# Patient Record
Sex: Female | Born: 1945 | Race: Black or African American | Hispanic: No | Marital: Married | Smoking: Former smoker
Health system: Southern US, Community
[De-identification: ages and names within clinical notes are randomized; demographics above are authoritative.]

## PROBLEM LIST (undated history)

## (undated) DIAGNOSIS — M899 Disorder of bone, unspecified: Secondary | ICD-10-CM

## (undated) DIAGNOSIS — N924 Excessive bleeding in the premenopausal period: Secondary | ICD-10-CM

## (undated) DIAGNOSIS — L723 Sebaceous cyst: Secondary | ICD-10-CM

## (undated) DIAGNOSIS — R7989 Other specified abnormal findings of blood chemistry: Secondary | ICD-10-CM

## (undated) DIAGNOSIS — K5732 Diverticulitis of large intestine without perforation or abscess without bleeding: Secondary | ICD-10-CM

## (undated) DIAGNOSIS — M67919 Unspecified disorder of synovium and tendon, unspecified shoulder: Secondary | ICD-10-CM

## (undated) DIAGNOSIS — M545 Low back pain, unspecified: Secondary | ICD-10-CM

## (undated) DIAGNOSIS — M6740 Ganglion, unspecified site: Secondary | ICD-10-CM

## (undated) DIAGNOSIS — E785 Hyperlipidemia, unspecified: Secondary | ICD-10-CM

## (undated) DIAGNOSIS — I10 Essential (primary) hypertension: Secondary | ICD-10-CM

## (undated) DIAGNOSIS — R0602 Shortness of breath: Secondary | ICD-10-CM

## (undated) DIAGNOSIS — M719 Bursopathy, unspecified: Secondary | ICD-10-CM

## (undated) DIAGNOSIS — M949 Disorder of cartilage, unspecified: Secondary | ICD-10-CM

## (undated) HISTORY — DX: Unspecified disorder of synovium and tendon, unspecified shoulder: M67.919

## (undated) HISTORY — DX: Excessive bleeding in the premenopausal period: N92.4

## (undated) HISTORY — DX: Other specified abnormal findings of blood chemistry: R79.89

## (undated) HISTORY — DX: Bursopathy, unspecified: M71.9

## (undated) HISTORY — DX: Diverticulitis of large intestine without perforation or abscess without bleeding: K57.32

## (undated) HISTORY — DX: Essential (primary) hypertension: I10

## (undated) HISTORY — DX: Low back pain, unspecified: M54.50

## (undated) HISTORY — DX: Hyperlipidemia, unspecified: E78.5

## (undated) HISTORY — DX: Disorder of cartilage, unspecified: M94.9

## (undated) HISTORY — DX: Low back pain: M54.5

## (undated) HISTORY — DX: Ganglion, unspecified site: M67.40

## (undated) HISTORY — DX: Sebaceous cyst: L72.3

## (undated) HISTORY — DX: Shortness of breath: R06.02

## (undated) HISTORY — DX: Disorder of bone, unspecified: M89.9

---

## 2006-02-24 LAB — HM DEXA SCAN: HM Dexa Scan: NORMAL

## 2006-03-13 ENCOUNTER — Encounter: Payer: Self-pay | Admitting: Physician Assistant

## 2009-05-28 ENCOUNTER — Encounter: Admission: RE | Admit: 2009-05-28 | Discharge: 2009-05-28 | Payer: Self-pay | Admitting: Internal Medicine

## 2009-06-01 ENCOUNTER — Telehealth: Payer: Self-pay | Admitting: Physician Assistant

## 2009-06-02 ENCOUNTER — Telehealth: Payer: Self-pay | Admitting: Physician Assistant

## 2009-06-02 ENCOUNTER — Ambulatory Visit: Payer: Self-pay | Admitting: Gastroenterology

## 2009-06-02 DIAGNOSIS — B370 Candidal stomatitis: Secondary | ICD-10-CM | POA: Insufficient documentation

## 2009-06-02 DIAGNOSIS — R1032 Left lower quadrant pain: Secondary | ICD-10-CM | POA: Insufficient documentation

## 2009-06-02 DIAGNOSIS — R933 Abnormal findings on diagnostic imaging of other parts of digestive tract: Secondary | ICD-10-CM

## 2009-06-02 DIAGNOSIS — K5732 Diverticulitis of large intestine without perforation or abscess without bleeding: Secondary | ICD-10-CM | POA: Insufficient documentation

## 2009-06-02 DIAGNOSIS — R935 Abnormal findings on diagnostic imaging of other abdominal regions, including retroperitoneum: Secondary | ICD-10-CM | POA: Insufficient documentation

## 2009-06-23 ENCOUNTER — Ambulatory Visit: Payer: Self-pay | Admitting: Gastroenterology

## 2009-06-23 LAB — HM COLONOSCOPY

## 2012-04-16 ENCOUNTER — Other Ambulatory Visit: Payer: Self-pay | Admitting: Internal Medicine

## 2012-04-16 NOTE — Telephone Encounter (Signed)
Pharmacy aware of reason for denial.

## 2013-03-02 ENCOUNTER — Other Ambulatory Visit: Payer: Self-pay | Admitting: Internal Medicine

## 2013-03-06 ENCOUNTER — Other Ambulatory Visit: Payer: Self-pay | Admitting: *Deleted

## 2013-03-06 DIAGNOSIS — I10 Essential (primary) hypertension: Secondary | ICD-10-CM

## 2013-03-06 DIAGNOSIS — E785 Hyperlipidemia, unspecified: Secondary | ICD-10-CM

## 2013-07-02 ENCOUNTER — Other Ambulatory Visit: Payer: Medicare Other

## 2013-07-02 DIAGNOSIS — I10 Essential (primary) hypertension: Secondary | ICD-10-CM

## 2013-07-02 DIAGNOSIS — E785 Hyperlipidemia, unspecified: Secondary | ICD-10-CM

## 2013-07-03 ENCOUNTER — Encounter: Payer: Self-pay | Admitting: *Deleted

## 2013-07-03 LAB — LIPID PANEL
Chol/HDL Ratio: 2.4 ratio units (ref 0.0–4.4)
Cholesterol, Total: 165 mg/dL (ref 100–199)
HDL: 69 mg/dL (ref >39)
LDL Calculated: 81 mg/dL (ref 0–99)
Triglycerides: 73 mg/dL (ref 0–149)
VLDL Cholesterol Cal: 15 mg/dL (ref 5–40)

## 2013-07-03 LAB — BASIC METABOLIC PANEL
BUN/Creatinine Ratio: 21 (ref 11–26)
BUN: 19 mg/dL (ref 8–27)
CO2: 27 mmol/L (ref 18–29)
Calcium: 10 mg/dL (ref 8.6–10.2)
Chloride: 102 mmol/L (ref 97–108)
Creatinine, Ser: 0.89 mg/dL (ref 0.57–1.00)
GFR calc Af Amer: 78 mL/min/{1.73_m2} (ref >59)
GFR calc non Af Amer: 67 mL/min/{1.73_m2} (ref >59)
Glucose: 97 mg/dL (ref 65–99)
Potassium: 4.7 mmol/L (ref 3.5–5.2)
Sodium: 142 mmol/L (ref 134–144)

## 2013-07-04 ENCOUNTER — Encounter: Payer: Self-pay | Admitting: Internal Medicine

## 2013-07-04 ENCOUNTER — Ambulatory Visit (INDEPENDENT_AMBULATORY_CARE_PROVIDER_SITE_OTHER): Payer: Medicare Other | Admitting: Internal Medicine

## 2013-07-04 VITALS — BP 126/80 | HR 80 | Temp 98.8°F | Ht 61.0 in | Wt 118.0 lb

## 2013-07-04 DIAGNOSIS — M545 Low back pain, unspecified: Secondary | ICD-10-CM

## 2013-07-04 DIAGNOSIS — S4980XA Other specified injuries of shoulder and upper arm, unspecified arm, initial encounter: Secondary | ICD-10-CM

## 2013-07-04 DIAGNOSIS — E785 Hyperlipidemia, unspecified: Secondary | ICD-10-CM

## 2013-07-04 DIAGNOSIS — G5682 Other specified mononeuropathies of left upper limb: Secondary | ICD-10-CM

## 2013-07-04 DIAGNOSIS — G5680 Other specified mononeuropathies of unspecified upper limb: Secondary | ICD-10-CM

## 2013-07-04 DIAGNOSIS — S46002A Unspecified injury of muscle(s) and tendon(s) of the rotator cuff of left shoulder, initial encounter: Secondary | ICD-10-CM | POA: Insufficient documentation

## 2013-07-04 DIAGNOSIS — S46909A Unspecified injury of unspecified muscle, fascia and tendon at shoulder and upper arm level, unspecified arm, initial encounter: Secondary | ICD-10-CM

## 2013-07-04 DIAGNOSIS — I10 Essential (primary) hypertension: Secondary | ICD-10-CM

## 2013-07-04 NOTE — Progress Notes (Signed)
Patient ID: Tamara Davidson, female   DOB: 08-25-1946, 67 y.o.   MRN: [id number] Location:  [facility]  Code Status: DNR   Allergies  Allergen Reactions  . Iodine   . Penicillins   . Shellfish Allergy   . Statins Other (See Comments)    High LFT's    Chief Complaint  Patient presents with  . Follow-up    6 month follow-up, discuss labs (copy given)     HPI: Patient is a 67 y.o. black female seen in the office today for med mgt chronic diseases.  Left shoulder and lower back bothering her.  Was diagnosed with nerve impingement in left shoulder 10--11 years ago.  Had a shot 4th quarter 2013--lasted only 2 wks.  Weather may make worse late at night.  Has had PT before with [facility].  Had low back injury 1976--bothers more frequently now.  Dr. Venita Lick saw her husband and she wonders if shots will help her back.    Labs were fabulous--bmp and lipids.    Review of Systems:  Review of Systems  Constitutional: Negative for fever, chills, weight loss and malaise/fatigue.  Eyes: Negative for blurred vision.  Cardiovascular: Negative for chest pain, palpitations and leg swelling.  Gastrointestinal: Negative for constipation.  Genitourinary: Negative for dysuria, urgency and frequency.  Musculoskeletal: Positive for back pain, joint pain and myalgias. Negative for falls.  Skin: Negative for rash.  Neurological: Negative for dizziness, loss of consciousness, weakness and headaches.  Endo/Heme/Allergies: Does not bruise/bleed easily.  Psychiatric/Behavioral: Negative for depression and memory loss.     Past Medical History  Diagnosis Date  . Sebaceous cyst   . 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 Essential hypertension, benign   . Premenopausal menorrhagia   . Disorder of bone and cartilage, unspecified   . Ganglion of tendon sheath   . Shortness of breath   . Lumbago   . Other abnormal blood  chemistry   . Diverticulitis of colon (without mention of hemorrhage)   . Other and unspecified hyperlipidemia     No past surgical history on file.  Social History:   reports that she has quit smoking. She started smoking about 43 years ago. She does not have any smokeless tobacco history on file. She reports that she does not drink alcohol or use illicit drugs.  No family history on file.  Medications: Patient's Medications  New Prescriptions   No medications on file  Previous Medications   ASPIRIN 81 MG TABLET    Take 81 mg by mouth daily. Take 1 tablet daily.   BLACK COHOSH 540 MG CAPS    Take 540 mg by mouth daily. Take 1 capsule once daily.   CALCIUM CARBONATE (OS-CAL) 600 MG TABS TABLET    Take 600 mg by mouth 2 (two) times daily with a meal. Take 1 tablet twice daily.   CETIRIZINE (ZYRTEC) 10 MG TABLET    Take 10 mg by mouth daily. Take 1 tablet once daily as needed for allergies.   CHOLECALCIFEROL (VITAMIN D) 400 UNITS TABS TABLET    Take 400 Units by mouth daily. Take 2 tablets once daily for bones.   GLUCOSAMINE-CHONDROIT-VIT C-MN (GLUCOSAMINE-CHONDROITIN) TABS    Take by mouth.   LISINOPRIL-HYDROCHLOROTHIAZIDE (PRINZIDE,ZESTORETIC) 10-12.5 MG PER TABLET    TAKE 1 TABLET DAILY FOR BLOOD PRESSURE   MULTIPLE VITAMINS-MINERALS (MULTIVITAMIN WITH MINERALS) TABLET    Take 1 tablet by mouth daily.  PROBIOTIC PRODUCT (PROBIOTIC DAILY PO)    Take by mouth. Insync: 1 by mouth daily   ROSUVASTATIN (CRESTOR) 5 MG TABLET    Take 5 mg by mouth daily. Take 1/2 tablet by mouth at bedtime Mon, Wed, Fri. Avoid grapefruit/products.   VITAMIN B-12 (CYANOCOBALAMIN) 500 MCG TABLET    Take 500 mcg by mouth daily.   VITAMIN C (ASCORBIC ACID) 500 MG TABLET    Take 500 mg by mouth daily.  Modified Medications   No medications on file  Discontinued Medications   No medications on file     Physical Exam: Filed Vitals:   07/04/13 1136  BP: 126/80  Pulse: 80  Temp: 98.8 F (37.1 C)   TempSrc: Oral  Height: 5\' 1"  (1.549 m)  Weight: 118 lb (53.524 kg)  SpO2: 97%  Physical Exam  Constitutional: She is oriented to person, place, and time. She appears well-developed and well-nourished. No distress.  Neck: Normal range of motion.  Cardiovascular: Normal rate, regular rhythm, normal heart sounds and intact distal pulses.   Pulmonary/Chest: Effort normal and breath sounds normal. No respiratory distress.  Abdominal: Soft. Bowel sounds are normal. She exhibits no distension and no mass. There is no tenderness.  Musculoskeletal: She exhibits tenderness.  Left shoulder over rotator cuff insertion  Neurological: She is alert and oriented to person, place, and time.  Skin: Skin is warm and dry.  Psychiatric: She has a normal mood and affect.    Labs reviewed: Basic Metabolic Panel:  Recent Labs  16/10/96 0813  NA 142  K 4.7  CL 102  CO2 27  GLUCOSE 97  BUN 19  CREATININE 0.89  CALCIUM 10.0  Lipid Panel:  Recent Labs  07/02/13 0813  HDL 69  LDLCALC 81  TRIG 73  CHOLHDL 2.4  Assessment/Plan 1. Hyperlipidemia LDL goal < 100 -cont low dose crestor and diet and exercise as she can tolerate with current shoulder and back pain  2. Essential hypertension, benign -bp stable with lisinopril/hctz, asa 81mg   3. Pinched nerve in shoulder, left -referred to orthopedics for further evaluation and management  4. Low back pain -seeing ortho and may get back injections, cont tylenol use and rare nsaids if needed  8/25, saw gyn and had mammogram--last august had pinhead area on right breast--has to get one more 6 month f/u due to this.  If stable again, can go back to annual.  Labs/tests ordered:  Orders Placed This Encounter  Procedures  . DNR (Do Not Resuscitate)    Order Specific Question:  Maintain current active treatments    Answer:  Yes    Order Specific Question:  Do not initiate new interventions    Answer:  Yes    Next appt:  6 mos

## 2013-08-05 ENCOUNTER — Ambulatory Visit: Payer: Self-pay

## 2013-09-11 ENCOUNTER — Encounter: Payer: Self-pay | Admitting: Internal Medicine

## 2013-09-11 ENCOUNTER — Ambulatory Visit (INDEPENDENT_AMBULATORY_CARE_PROVIDER_SITE_OTHER): Payer: Medicare Other | Admitting: Internal Medicine

## 2013-09-11 VITALS — BP 102/60 | HR 83 | Temp 98.1°F | Wt 120.2 lb

## 2013-09-11 DIAGNOSIS — M67919 Unspecified disorder of synovium and tendon, unspecified shoulder: Secondary | ICD-10-CM

## 2013-09-11 DIAGNOSIS — M7592 Shoulder lesion, unspecified, left shoulder: Secondary | ICD-10-CM

## 2013-09-11 DIAGNOSIS — S46819A Strain of other muscles, fascia and tendons at shoulder and upper arm level, unspecified arm, initial encounter: Secondary | ICD-10-CM

## 2013-09-11 DIAGNOSIS — E785 Hyperlipidemia, unspecified: Secondary | ICD-10-CM

## 2013-09-11 DIAGNOSIS — Z01818 Encounter for other preprocedural examination: Secondary | ICD-10-CM

## 2013-09-11 DIAGNOSIS — I10 Essential (primary) hypertension: Secondary | ICD-10-CM

## 2013-09-11 DIAGNOSIS — S43499A Other sprain of unspecified shoulder joint, initial encounter: Secondary | ICD-10-CM

## 2013-09-11 DIAGNOSIS — S46112A Strain of muscle, fascia and tendon of long head of biceps, left arm, initial encounter: Secondary | ICD-10-CM

## 2013-09-11 NOTE — Progress Notes (Signed)
Patient ID: Korynn Kenedy, female   DOB: 12/14/1945, 67 y.o.   MRN: [id number] Location:  [facility]   Allergies  Allergen Reactions  . Iodine   . Penicillins   . Shellfish Allergy   . Statins Other (See Comments)    High LFT's    Chief Complaint  Patient presents with  . Shoulder Pain    shoulder pain x     HPI: Patient is a 67 y.o. black female seen in the office today for medical clearance prior to shoulder surgery.    Waking up with 12/10 pain at night sometimes in the left shoulder.   Has at least partially torn biceps tendon and moderate supraspinatus tendinosis on MRI at [facility] on 08/28/13.   10-15 years ago told she had impingement in her shoulder   She has not had any significant medical problems.  Is very active--works out regularly and eats a healthy diet.    Review of Systems:  Review of Systems  Constitutional: Negative for fever and chills.  Respiratory: Negative for shortness of breath.   Cardiovascular: Negative for chest pain.  Gastrointestinal: Negative for abdominal pain, constipation, blood in stool and melena.  Genitourinary: Negative for dysuria.  Musculoskeletal: Positive for joint pain and myalgias. Negative for falls.  Neurological: Positive for focal weakness.       Of left shoulder due to pain, torn tendon     Past Medical History  Diagnosis Date  . Sebaceous cyst   . 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 Essential hypertension, benign   . Premenopausal menorrhagia   . Disorder of bone and cartilage, unspecified   . Ganglion of tendon sheath   . Shortness of breath   . Lumbago   . Other abnormal blood chemistry   . Diverticulitis of colon (without mention of hemorrhage)   . Other and unspecified hyperlipidemia     History reviewed. No pertinent past surgical history.  Social History:   reports that she has quit smoking. She started smoking about 43  years ago. She does not have any smokeless tobacco history on file. She reports that she does not drink alcohol or use illicit drugs.  History reviewed. No pertinent family history.  Medications: Patient's Medications  New Prescriptions   No medications on file  Previous Medications   ASPIRIN 81 MG TABLET    Take 81 mg by mouth daily. Take 1 tablet daily.   BLACK COHOSH 540 MG CAPS    Take 540 mg by mouth daily. Take 1 capsule once daily.   CALCIUM CARBONATE (OS-CAL) 600 MG TABS TABLET    Take 600 mg by mouth 2 (two) times daily with a meal. Take 1 tablet twice daily.   CETIRIZINE (ZYRTEC) 10 MG TABLET    Take 10 mg by mouth daily. Take 1 tablet once daily as needed for allergies.   CHOLECALCIFEROL (VITAMIN D) 400 UNITS TABS TABLET    Take 400 Units by mouth daily. Take 2 tablets once daily for bones.   GLUCOSAMINE-CHONDROIT-VIT C-MN (GLUCOSAMINE-CHONDROITIN) TABS    Take by mouth.   LISINOPRIL-HYDROCHLOROTHIAZIDE (PRINZIDE,ZESTORETIC) 10-12.5 MG PER TABLET    TAKE 1 TABLET DAILY FOR BLOOD PRESSURE   MULTIPLE VITAMINS-MINERALS (MULTIVITAMIN WITH MINERALS) TABLET    Take 1 tablet by mouth daily.   PROBIOTIC PRODUCT (PROBIOTIC DAILY PO)    Take by mouth. Insync: 1 by mouth daily   ROSUVASTATIN (CRESTOR) 5 MG TABLET  Take 5 mg by mouth daily. Take 1/2 tablet by mouth at bedtime Mon, Wed, Fri. Avoid grapefruit/products.   VITAMIN B-12 (CYANOCOBALAMIN) 500 MCG TABLET    Take 500 mcg by mouth daily.   VITAMIN C (ASCORBIC ACID) 500 MG TABLET    Take 500 mg by mouth daily.  Modified Medications   No medications on file  Discontinued Medications   No medications on file     Physical Exam: Filed Vitals:   09/11/13 1502  BP: 102/60  Pulse: 83  Temp: 98.1 F (36.7 C)  TempSrc: Oral  Weight: 120 lb 3.2 oz (54.522 kg)  SpO2: 97%  Physical Exam  Constitutional: She is oriented to person, place, and time. She appears well-developed and well-nourished. No distress.  HENT:  Head:  Normocephalic and atraumatic.  Eyes: EOM are normal. Pupils are equal, round, and reactive to light.  Neck: Neck supple.  Cardiovascular: Normal rate, regular rhythm, normal heart sounds and intact distal pulses.  Exam reveals no gallop and no friction rub.   No murmur heard. Pulmonary/Chest: Effort normal and breath sounds normal. No respiratory distress.  Abdominal: Soft. Bowel sounds are normal. There is no tenderness.  Musculoskeletal: Normal range of motion. She exhibits no edema.  Left shoulder with normal ROM but pain--she actually would not move the shoulder for me b/c it hurts so much;  Some localized tenderness over biceps insertion site region  Neurological: She is alert and oriented to person, place, and time. She displays normal reflexes. No cranial nerve deficit. She exhibits normal muscle tone. Coordination normal.  Skin: Skin is warm and dry.  Psychiatric: She has a normal mood and affect. Her behavior is normal. Judgment and thought content normal.    Labs reviewed: Basic Metabolic Panel:  Recent Labs  09/81/19 0813  NA 142  K 4.7  CL 102  CO2 27  GLUCOSE 97  BUN 19  CREATININE 0.89  CALCIUM 10.0  Lipid Panel:  Recent Labs  07/02/13 0813  HDL 69  LDLCALC 81  TRIG 73  CHOLHDL 2.4   Past Procedures:  Assessment/Plan 1. Preoperative clearance -all September labs normal -check cbc with diff b/c this has not been done recently - EKG 12-Lead--was normal sinus today w/o abnormality -she has no complaints aside from her localized shoulder discomfort for which she is to have surgery -continue active lifestyle with healthy diet -is low risk patient at this point  2. Essential hypertension, benign -bp at goal  3. Hyperlipidemia LDL goal < 100 -lipids at goal, cont diet and exercise and very low dose crestor  4. Biceps tendon tear, left, initial encounter -for surgery by Dr. Tod Persia mgt through orthopedics  5. Supraspinatus tendonitis,  left --for surgery by Dr. Tod Persia mgt through orthopedics   Labs/tests ordered:  Cbc today Next appt:  Keep scheduled appt

## 2013-09-12 LAB — CBC WITH DIFFERENTIAL/PLATELET
Basophils Absolute: 0 10*3/uL (ref 0.0–0.2)
Basos: 0 %
Eos: 2 %
Eosinophils Absolute: 0.1 10*3/uL (ref 0.0–0.4)
HCT: 40.7 % (ref 34.0–46.6)
Hemoglobin: 13.6 g/dL (ref 11.1–15.9)
Immature Grans (Abs): 0 10*3/uL (ref 0.0–0.1)
Immature Granulocytes: 0 %
Lymphocytes Absolute: 2.5 10*3/uL (ref 0.7–3.1)
Lymphs: 35 %
MCH: 30.3 pg (ref 26.6–33.0)
MCHC: 33.4 g/dL (ref 31.5–35.7)
MCV: 91 fL (ref 79–97)
Monocytes Absolute: 0.4 10*3/uL (ref 0.1–0.9)
Monocytes: 6 %
Neutrophils Absolute: 4.1 10*3/uL (ref 1.4–7.0)
Neutrophils Relative %: 57 %
RBC: 4.49 x10E6/uL (ref 3.77–5.28)
RDW: 13.6 % (ref 12.3–15.4)
WBC: 7.2 10*3/uL (ref 3.4–10.8)

## 2013-09-30 ENCOUNTER — Encounter: Payer: Self-pay | Admitting: Internal Medicine

## 2013-10-06 HISTORY — PX: ROTATOR CUFF REPAIR: SHX139

## 2013-12-07 ENCOUNTER — Other Ambulatory Visit: Payer: Self-pay | Admitting: Internal Medicine

## 2013-12-16 DIAGNOSIS — M545 Low back pain, unspecified: Secondary | ICD-10-CM | POA: Insufficient documentation

## 2013-12-16 DIAGNOSIS — E785 Hyperlipidemia, unspecified: Secondary | ICD-10-CM | POA: Insufficient documentation

## 2013-12-16 DIAGNOSIS — I10 Essential (primary) hypertension: Secondary | ICD-10-CM | POA: Insufficient documentation

## 2014-01-05 ENCOUNTER — Other Ambulatory Visit: Payer: Self-pay | Admitting: Internal Medicine

## 2014-01-05 ENCOUNTER — Ambulatory Visit (INDEPENDENT_AMBULATORY_CARE_PROVIDER_SITE_OTHER): Payer: Medicare Other | Admitting: Internal Medicine

## 2014-01-05 ENCOUNTER — Encounter: Payer: Self-pay | Admitting: Internal Medicine

## 2014-01-05 VITALS — BP 108/70 | HR 84 | Temp 98.2°F | Resp 14 | Wt 116.4 lb

## 2014-01-05 DIAGNOSIS — S4980XA Other specified injuries of shoulder and upper arm, unspecified arm, initial encounter: Secondary | ICD-10-CM

## 2014-01-05 DIAGNOSIS — I10 Essential (primary) hypertension: Secondary | ICD-10-CM

## 2014-01-05 DIAGNOSIS — M545 Low back pain, unspecified: Secondary | ICD-10-CM

## 2014-01-05 DIAGNOSIS — M8580 Other specified disorders of bone density and structure, unspecified site: Secondary | ICD-10-CM

## 2014-01-05 DIAGNOSIS — M949 Disorder of cartilage, unspecified: Secondary | ICD-10-CM

## 2014-01-05 DIAGNOSIS — E785 Hyperlipidemia, unspecified: Secondary | ICD-10-CM

## 2014-01-05 DIAGNOSIS — S46002A Unspecified injury of muscle(s) and tendon(s) of the rotator cuff of left shoulder, initial encounter: Secondary | ICD-10-CM

## 2014-01-05 DIAGNOSIS — S46909A Unspecified injury of unspecified muscle, fascia and tendon at shoulder and upper arm level, unspecified arm, initial encounter: Secondary | ICD-10-CM

## 2014-01-05 DIAGNOSIS — M899 Disorder of bone, unspecified: Secondary | ICD-10-CM

## 2014-01-05 MED ORDER — LISINOPRIL-HYDROCHLOROTHIAZIDE 10-12.5 MG PO TABS: 1.0000 | ORAL_TABLET | Freq: Every day | ORAL | Status: DC

## 2014-01-05 NOTE — Progress Notes (Signed)
Patient ID: Tamara Davidson, female   DOB: 1946-03-15, 68 y.o.   MRN: [id number]   Location:  [facility]  Code Status: DNR  Allergies  Allergen Reactions  . Iodine   . Penicillins   . Shellfish Allergy   . Statins Other (See Comments)    High LFT's    Chief Complaint  Patient presents with  . Medical Managment of Chronic Issues    f/u     HPI: Patient is a 68 y.o.  seen in the office today for medical mgt of chronic diseases.    She underwent surgery by Dr. Malon Kindle for her torn biceps tendon and moderate supraspinatus tendinosis seen on MRI.  I have not received a copy of the op report.  Left shoulder rotator cuff, tendon and ligament repair.  Doing well.  Still getting physical therapy.  Pain is remarkably improved.  Gets positional numbness and tingling if lays on left side.  Sees Dr. Ranell Patrick on Wednesday.   Wants to know when next bone density is.  02/24/06 was last bone density.  Needs a 6 month f/u mammogram done.  Could not lift her arm after the shoulder surgery to get it on time.    Neck pain is now gone.  Back pain also better interestingly.  Review of Systems:  ROS   Past Medical History  Diagnosis Date  . Sebaceous cyst   . 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 Essential hypertension, benign   . Premenopausal menorrhagia   . Disorder of bone and cartilage, unspecified   . Ganglion of tendon sheath   . Shortness of breath   . Lumbago   . Other abnormal blood chemistry   . Diverticulitis of colon (without mention of hemorrhage)   . Other and unspecified hyperlipidemia     Past Surgical History  Procedure Laterality Date  . Rotator cuff repair Left 10/06/13    tendon and ligament repair    Social History:   reports that she has quit smoking. She started smoking about 44 years ago. She does not have any smokeless tobacco history on file. She reports that she does not drink alcohol or use  illicit drugs.  Family History  Problem Relation Age of Onset  . Lung cancer Mother   . Osteoporosis Mother   . Coronary artery disease Father     Medications: Patient's Medications  New Prescriptions   No medications on file  Previous Medications   ASPIRIN 81 MG TABLET    Take 81 mg by mouth daily. Take 1 tablet daily.   BLACK COHOSH 540 MG CAPS    Take 540 mg by mouth daily. Take 1 capsule once daily.   CALCIUM CARBONATE (OS-CAL) 600 MG TABS TABLET    Take 600 mg by mouth 2 (two) times daily with a meal. Take 1 tablet twice daily.   CETIRIZINE (ZYRTEC) 10 MG TABLET    Take 10 mg by mouth daily. Take 1 tablet once daily as needed for allergies.   CHOLECALCIFEROL (VITAMIN D) 400 UNITS TABS TABLET    Take 400 Units by mouth daily. Take 2 tablets once daily for bones.   GLUCOSAMINE-CHONDROIT-VIT C-MN (GLUCOSAMINE-CHONDROITIN) TABS    Take by mouth.   LISINOPRIL-HYDROCHLOROTHIAZIDE (PRINZIDE,ZESTORETIC) 10-12.5 MG PER TABLET    TAKE 1 TABLET DAILY FOR BLOOD PRESSURE   MULTIPLE VITAMINS-MINERALS (MULTIVITAMIN WITH MINERALS) TABLET    Take 1 tablet by mouth daily.   PROBIOTIC  PRODUCT (PROBIOTIC DAILY PO)    Take by mouth. Insync: 1 by mouth daily   ROSUVASTATIN (CRESTOR) 5 MG TABLET    Take 5 mg by mouth daily. Take 1/2 tablet by mouth at bedtime Mon, Wed, Fri. Avoid grapefruit/products.   VITAMIN B-12 (CYANOCOBALAMIN) 500 MCG TABLET    Take 500 mcg by mouth daily.   VITAMIN C (ASCORBIC ACID) 500 MG TABLET    Take 500 mg by mouth daily.  Modified Medications   No medications on file  Discontinued Medications   No medications on file     Physical Exam: Filed Vitals:   01/05/14 1112  BP: 108/70  Pulse: 84  Temp: 98.2 F (36.8 C)  TempSrc: Oral  Resp: 14  Weight: 116 lb 6.4 oz (52.799 kg)  SpO2: 98%  Physical Exam   Labs reviewed: Basic Metabolic Panel:  Recent Labs  40/98/1108/01/11 0813  NA 142  K 4.7  CL 102  CO2 27  GLUCOSE 97  BUN 19  CREATININE 0.89  CALCIUM 10.0    CBC:  Recent Labs  09/11/13 1618  WBC 7.2  NEUTROABS 4.1  HGB 13.6  HCT 40.7  MCV 91   Lipid Panel:  Recent Labs  07/02/13 0813  HDL 69  LDLCALC 81  TRIG 73  CHOLHDL 2.4   Assessment/Plan 1. Osteopenia -pt is concerned about her bone density due to family history of osteoporosis -fortunately she continues ca with d and weight bearing exercise regularly, eats a balanced, healthy diet - DG Bone Density; Future - Vitamin D, 25-hydroxy; Future  2. Injury of left rotator cuff -s/p repair in December -will request copy of op report from Dr. Malon KindleSteven Norris to make sure that we document the procedure properly -doing very well with therapy and follows up with him next week  3. Hyperlipidemia LDL goal < 100 - CBC with Differential; Future - Comprehensive metabolic panel; Future - Lipid panel; Future -cont crestor as currently taking, diet and exercise which have been effective, f/u labs at 6 mo physical  4. Essential hypertension, benign - CBC with Differential; Future - lisinopril-hydrochlorothiazide (PRINZIDE,ZESTORETIC) 10-12.5 MG per tablet; Take 1 tablet by mouth daily.  Dispense: 90 tablet; Refill: 3  5. Low back pain -has resolved since surgery for shoulder interestingly  Labs/tests ordered:   Orders Placed This Encounter  Procedures  . DG Bone Density    Standing Status: Future     Number of Occurrences:      Standing Expiration Date: 03/08/2015    Order Specific Question:  Reason for Exam (SYMPTOM  OR DIAGNOSIS REQUIRED)    Answer:  osteopenia, postmenopausal, fh/o osteoporosis     Comments:  is on oscal with D    Order Specific Question:  Preferred imaging location?    [facility]  . CBC with Differential    Standing Status: Future     Number of Occurrences:      Standing Expiration Date: 01/06/2015  . Comprehensive metabolic panel    Standing Status: Future     Number of Occurrences:      Standing Expiration Date: 01/06/2015  . Lipid panel     Standing Status: Future     Number of Occurrences:      Standing Expiration Date: 01/06/2015  . Vitamin D, 25-hydroxy    Standing Status: Future     Number of Occurrences:      Standing Expiration Date: 01/06/2015    Next appt:  6 mos EV with labs before

## 2014-07-09 ENCOUNTER — Other Ambulatory Visit: Payer: Medicare Other

## 2014-07-09 DIAGNOSIS — E785 Hyperlipidemia, unspecified: Secondary | ICD-10-CM

## 2014-07-09 DIAGNOSIS — I10 Essential (primary) hypertension: Secondary | ICD-10-CM

## 2014-07-09 DIAGNOSIS — M8580 Other specified disorders of bone density and structure, unspecified site: Secondary | ICD-10-CM

## 2014-07-10 LAB — COMPREHENSIVE METABOLIC PANEL
ALT: 15 IU/L (ref 0–32)
AST: 23 IU/L (ref 0–40)
Albumin/Globulin Ratio: 1.6 (ref 1.1–2.5)
Albumin: 4.4 g/dL (ref 3.6–4.8)
Alkaline Phosphatase: 86 IU/L (ref 39–117)
BUN/Creatinine Ratio: 17 (ref 11–26)
BUN: 15 mg/dL (ref 8–27)
CO2: 24 mmol/L (ref 18–29)
Calcium: 9.7 mg/dL (ref 8.7–10.3)
Chloride: 99 mmol/L (ref 97–108)
Creatinine, Ser: 0.88 mg/dL (ref 0.57–1.00)
GFR calc Af Amer: 78 mL/min/{1.73_m2} (ref >59)
GFR calc non Af Amer: 68 mL/min/{1.73_m2} (ref >59)
Globulin, Total: 2.8 g/dL (ref 1.5–4.5)
Glucose: 101 mg/dL — ABNORMAL HIGH (ref 65–99)
Potassium: 3.9 mmol/L (ref 3.5–5.2)
Sodium: 138 mmol/L (ref 134–144)
Total Bilirubin: 0.4 mg/dL (ref 0.0–1.2)
Total Protein: 7.2 g/dL (ref 6.0–8.5)

## 2014-07-10 LAB — CBC WITH DIFFERENTIAL/PLATELET
Basophils Absolute: 0 10*3/uL (ref 0.0–0.2)
Basos: 0 %
Eos: 1 %
Eosinophils Absolute: 0.1 10*3/uL (ref 0.0–0.4)
HCT: 40.1 % (ref 34.0–46.6)
Hemoglobin: 13.8 g/dL (ref 11.1–15.9)
Immature Grans (Abs): 0 10*3/uL (ref 0.0–0.1)
Immature Granulocytes: 0 %
Lymphocytes Absolute: 2.9 10*3/uL (ref 0.7–3.1)
Lymphs: 49 %
MCH: 30.9 pg (ref 26.6–33.0)
MCHC: 34.4 g/dL (ref 31.5–35.7)
MCV: 90 fL (ref 79–97)
Monocytes Absolute: 0.3 10*3/uL (ref 0.1–0.9)
Monocytes: 5 %
Neutrophils Absolute: 2.7 10*3/uL (ref 1.4–7.0)
Neutrophils Relative %: 45 %
RBC: 4.46 x10E6/uL (ref 3.77–5.28)
RDW: 14.7 % (ref 12.3–15.4)
WBC: 6 10*3/uL (ref 3.4–10.8)

## 2014-07-10 LAB — LIPID PANEL
Chol/HDL Ratio: 2.8 ratio units (ref 0.0–4.4)
Cholesterol, Total: 215 mg/dL — ABNORMAL HIGH (ref 100–199)
HDL: 76 mg/dL (ref >39)
LDL Calculated: 124 mg/dL — ABNORMAL HIGH (ref 0–99)
Triglycerides: 76 mg/dL (ref 0–149)
VLDL Cholesterol Cal: 15 mg/dL (ref 5–40)

## 2014-07-10 LAB — VITAMIN D 25 HYDROXY (VIT D DEFICIENCY, FRACTURES): Vit D, 25-Hydroxy: 61.9 ng/mL (ref 30.0–100.0)

## 2014-07-13 ENCOUNTER — Ambulatory Visit (INDEPENDENT_AMBULATORY_CARE_PROVIDER_SITE_OTHER): Payer: Medicare Other | Admitting: Internal Medicine

## 2014-07-13 ENCOUNTER — Encounter: Payer: Self-pay | Admitting: Internal Medicine

## 2014-07-13 VITALS — BP 120/72 | HR 73 | Temp 98.2°F | Resp 10 | Ht 61.5 in | Wt 118.0 lb

## 2014-07-13 DIAGNOSIS — I10 Essential (primary) hypertension: Secondary | ICD-10-CM

## 2014-07-13 DIAGNOSIS — E785 Hyperlipidemia, unspecified: Secondary | ICD-10-CM

## 2014-07-13 DIAGNOSIS — Z0000 Encounter for general adult medical examination without abnormal findings: Secondary | ICD-10-CM

## 2014-07-13 DIAGNOSIS — Z23 Encounter for immunization: Secondary | ICD-10-CM

## 2014-07-13 DIAGNOSIS — H43392 Other vitreous opacities, left eye: Secondary | ICD-10-CM

## 2014-07-13 DIAGNOSIS — H43399 Other vitreous opacities, unspecified eye: Secondary | ICD-10-CM

## 2014-07-13 NOTE — Progress Notes (Signed)
Patient ID: Tamara Davidson, female   DOB: Aug 05, 1946, 68 y.o.   MRN: [id number]   Location:  [facility]  Code Status: DNR  Allergies  Allergen Reactions  . Iodine   . Penicillins   . Shellfish Allergy   . Statins Other (See Comments)    High LFT's    Chief Complaint  Patient presents with  . Annual Exam    Yearly check-up, no pap (GYN) - completed last year .   Marland Kitchen Referral    Eye doctor    HPI: Patient is a 68 y.o. black female seen in the office today for her annual exam.  She goes to the gyn for her pap smears.  She had her mammogram and bone density done.  She scored 29/30 on her mmse, missing 1 on recall and she actually set the clock as 10:50 instead of 11:10.    Has some floaters off and on in left eye and requests to see Dr. Elmer Picker for f/u.    Is fully recovered with her shoulder.    Bad cholesterol has gone up but the good has, as well.  Cooked when they went on vacation so did not change her diet.  Is not walking everyday like she was.  Is doing three days a week of exercise classes instead.  Discussed returning to walking.  Review of Systems:  Review of Systems  Constitutional: Negative for fever, chills and weight loss.  HENT: Negative for congestion and hearing loss.        Postnasal drip  Eyes: Negative for blurred vision.       Floaters  Respiratory: Negative for cough and shortness of breath.   Cardiovascular: Negative for chest pain, palpitations and leg swelling.  Gastrointestinal: Negative for heartburn, abdominal pain, constipation, blood in stool and melena.  Genitourinary: Negative for dysuria, urgency and frequency.  Musculoskeletal: Negative for falls, myalgias and neck pain.  Skin: Negative for rash.  Neurological: Negative for dizziness, loss of consciousness, weakness and headaches.  Endo/Heme/Allergies:       Wants to get through menopause; hot flashes not as bad as they used to  Psychiatric/Behavioral:  Negative for depression and memory loss.     Past Medical History  Diagnosis Date  . Sebaceous cyst   . 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 Essential hypertension, benign   . Premenopausal menorrhagia   . Disorder of bone and cartilage, unspecified   . Ganglion of tendon sheath   . Shortness of breath   . Lumbago   . Other abnormal blood chemistry   . Diverticulitis of colon (without mention of hemorrhage)   . Other and unspecified hyperlipidemia     Past Surgical History  Procedure Laterality Date  . Rotator cuff repair Left 10/06/13    tendon and ligament repair    Social History:   reports that she has quit smoking. She started smoking about 44 years ago. She does not have any smokeless tobacco history on file. She reports that she does not drink alcohol or use illicit drugs.  Family History  Problem Relation Age of Onset  . Lung cancer Mother   . Osteoporosis Mother   . Coronary artery disease Father     Medications: Patient's Medications  New Prescriptions   No medications on file  Previous Medications   ASPIRIN 81 MG TABLET    Take 81 mg by mouth daily. Take 1 tablet daily.  BLACK COHOSH 540 MG CAPS    Take 540 mg by mouth daily. Take 1 capsule once daily.   CALCIUM CARBONATE (OS-CAL) 600 MG TABS TABLET    Take 600 mg by mouth 2 (two) times daily with a meal. Take 1 tablet twice daily.   CETIRIZINE (ZYRTEC) 10 MG TABLET    Take 10 mg by mouth daily. Take 1 tablet once daily as needed for allergies.   CHOLECALCIFEROL (VITAMIN D) 400 UNITS TABS TABLET    Take 400 Units by mouth daily. Take 2 tablets once daily for bones.   CRESTOR 5 MG TABLET    TAKE ONE-HALF (1/2) TABLET AT BEDTIME MONDAY, WEDNESDAY, AND FRIDAY , AVOID GRAPEFRUIT /PRODUCTS   GLUCOSAMINE HCL 1500 MG TABS    Take by mouth daily.   LISINOPRIL-HYDROCHLOROTHIAZIDE (PRINZIDE,ZESTORETIC) 10-12.5 MG PER TABLET    Take 1 tablet by mouth daily.   MULTIPLE VITAMINS-MINERALS  (MULTIVITAMIN WITH MINERALS) TABLET    Take 1 tablet by mouth daily.   PROBIOTIC PRODUCT (PROBIOTIC DAILY PO)    Take by mouth. Insync: 1 by mouth daily   VITAMIN B-12 (CYANOCOBALAMIN) 500 MCG TABLET    Take 500 mcg by mouth daily.   VITAMIN C (ASCORBIC ACID) 500 MG TABLET    Take 500 mg by mouth daily.  Modified Medications   No medications on file  Discontinued Medications   GLUCOSAMINE-CHONDROIT-VIT C-MN (GLUCOSAMINE-CHONDROITIN) TABS    Take by mouth.     Physical Exam: Filed Vitals:   07/13/14 1349  BP: 120/72  Pulse: 73  Temp: 98.2 F (36.8 C)  TempSrc: Oral  Resp: 10  Height: 5' 1.5" (1.562 m)  Weight: 118 lb (53.524 kg)  SpO2: 98%  Physical Exam  Constitutional: She is oriented to person, place, and time. She appears well-developed and well-nourished. No distress.  HENT:  Head: Normocephalic and atraumatic.  Right Ear: External ear normal.  Left Ear: External ear normal.  Nose: Nose normal.  Mouth/Throat: Oropharynx is clear and moist. No oropharyngeal exudate.  Eyes: Conjunctivae and EOM are normal. Pupils are equal, round, and reactive to light.  Neck: Normal range of motion. Neck supple. No JVD present.  Cardiovascular: Normal rate, regular rhythm, normal heart sounds and intact distal pulses.   Pulmonary/Chest: Effort normal and breath sounds normal. No respiratory distress.  Abdominal: Soft. Bowel sounds are normal. She exhibits no distension and no mass. There is no tenderness.  Musculoskeletal: Normal range of motion. She exhibits no edema and no tenderness.  Lymphadenopathy:    She has no cervical adenopathy.  Neurological: She is alert and oriented to person, place, and time. She has normal reflexes. No cranial nerve deficit.  Skin: Skin is warm and dry.  Psychiatric: She has a normal mood and affect. Her behavior is normal. Judgment and thought content normal.    Labs reviewed: Basic Metabolic Panel:  Recent Labs  16/10/96 0913  NA 138  K 3.9  CL  99  CO2 24  GLUCOSE 101*  BUN 15  CREATININE 0.88  CALCIUM 9.7   Liver Function Tests:  Recent Labs  07/09/14 0913  AST 23  ALT 15  ALKPHOS 86  BILITOT 0.4  PROT 7.2   No results found for this basename: LIPASE, AMYLASE,  in the last 8760 hours No results found for this basename: AMMONIA,  in the last 8760 hours CBC:  Recent Labs  09/11/13 1618 07/09/14 0913  WBC 7.2 6.0  NEUTROABS 4.1 2.7  HGB 13.6 13.8  HCT 40.7  40.1  MCV 91 90   Lipid Panel:  Recent Labs  07/09/14 0913  HDL 76  LDLCALC 124*  TRIG 76  CHOLHDL 2.8    Assessment/Plan 1. Routine general medical examination at a health care facility - is up to date on her preventive care except she needs prevnar--discussed she can be added to the MA schedule for a vaccine the day she comes here with her husband for his next appt -got flu shot today -has pap/breast exam upcoming, mammo, bone density have both been done and bone density is now normal-cont ca and exercise and balanced diet  2. Floaters in visual field, left - recent problem -no other changes in her vision--uses readers only - Ambulatory referral to Ophthalmology  3. Essential hypertension, benign -bp at goal with current ace/hctz combo - CBC With differential/Platelet; Future - Basic metabolic panel; Future  4. Need for prophylactic vaccination and inoculation against influenza -given today  5. Hyperlipidemia - crestor 2.5mg  tiw -she swill get back to her walking which she had traded for other exercise, but her lipids have gone up so will restart - Lipid panel; Future  Labs/tests ordered: Orders Placed This Encounter  Procedures  . CBC With differential/Platelet    Standing Status: Future     Number of Occurrences:      Standing Expiration Date: 07/14/2015  . Basic metabolic panel    Standing Status: Future     Number of Occurrences:      Standing Expiration Date: 07/14/2015  . Lipid panel    Standing Status: Future     Number  of Occurrences:      Standing Expiration Date: 07/14/2015  . Ambulatory referral to Ophthalmology    Referral Priority:  Routine    Referral Type:  Consultation    Referral Reason:  Specialty Services Required    Requested Specialty:  Ophthalmology    Number of Visits Requested:  1    Next appt:  6 mos with labs before; also appt made for prevnar when she comes in with her husband for his next visit

## 2014-07-13 NOTE — Progress Notes (Signed)
Failed clock drawing  

## 2014-07-20 ENCOUNTER — Encounter: Payer: Self-pay | Admitting: Internal Medicine

## 2014-08-13 LAB — HM MAMMOGRAPHY: HM MAMMO: NEGATIVE

## 2014-08-14 ENCOUNTER — Encounter: Payer: Self-pay | Admitting: *Deleted

## 2014-08-19 ENCOUNTER — Encounter: Payer: Self-pay | Admitting: Internal Medicine

## 2014-09-06 ENCOUNTER — Other Ambulatory Visit: Payer: Self-pay | Admitting: Internal Medicine

## 2014-10-05 ENCOUNTER — Encounter: Payer: Self-pay | Admitting: *Deleted

## 2014-10-08 ENCOUNTER — Ambulatory Visit (INDEPENDENT_AMBULATORY_CARE_PROVIDER_SITE_OTHER): Payer: Medicare Other | Admitting: Internal Medicine

## 2014-10-08 DIAGNOSIS — Z23 Encounter for immunization: Secondary | ICD-10-CM

## 2014-10-08 MED ORDER — PNEUMOCOCCAL 13-VAL CONJ VACC IM SUSP
0.5000 mL | Freq: Once | INTRAMUSCULAR | Status: AC
  Administered 2014-10-08: 0.5 mL via INTRAMUSCULAR

## 2014-10-08 NOTE — Progress Notes (Signed)
Patient ID: Tamara Davidson, female   DOB: [birth year], 68 y.o.   MRN: [id number] prevnar given by MA

## 2014-10-09 NOTE — Addendum Note (Signed)
Addended by: Lamont SnowballICE, Braydyn Schultes L on: 10/09/2014 09:05 AM   Modules accepted: Orders

## 2014-11-15 ENCOUNTER — Other Ambulatory Visit: Payer: Self-pay | Admitting: Internal Medicine

## 2015-01-12 ENCOUNTER — Other Ambulatory Visit: Payer: Medicare Other

## 2015-01-14 ENCOUNTER — Ambulatory Visit: Payer: Medicare Other | Admitting: Internal Medicine

## 2015-01-21 ENCOUNTER — Ambulatory Visit: Payer: Medicare Other | Admitting: Internal Medicine

## 2015-01-29 ENCOUNTER — Other Ambulatory Visit: Payer: Self-pay

## 2015-02-02 ENCOUNTER — Other Ambulatory Visit: Payer: Medicare Other

## 2015-02-02 DIAGNOSIS — I10 Essential (primary) hypertension: Secondary | ICD-10-CM

## 2015-02-02 DIAGNOSIS — E785 Hyperlipidemia, unspecified: Secondary | ICD-10-CM

## 2015-02-03 LAB — CBC WITH DIFFERENTIAL
Basophils Absolute: 0 10*3/uL (ref 0.0–0.2)
Basos: 0 %
Eos: 1 %
Eosinophils Absolute: 0 10*3/uL (ref 0.0–0.4)
HCT: 39.5 % (ref 34.0–46.6)
Hemoglobin: 13.8 g/dL (ref 11.1–15.9)
Immature Grans (Abs): 0 10*3/uL (ref 0.0–0.1)
Immature Granulocytes: 0 %
Lymphocytes Absolute: 2.4 10*3/uL (ref 0.7–3.1)
Lymphs: 41 %
MCH: 30.6 pg (ref 26.6–33.0)
MCHC: 34.9 g/dL (ref 31.5–35.7)
MCV: 88 fL (ref 79–97)
Monocytes Absolute: 0.4 10*3/uL (ref 0.1–0.9)
Monocytes: 6 %
Neutrophils Absolute: 3 10*3/uL (ref 1.4–7.0)
Neutrophils Relative %: 52 %
RBC: 4.51 x10E6/uL (ref 3.77–5.28)
RDW: 14.2 % (ref 12.3–15.4)
WBC: 5.8 10*3/uL (ref 3.4–10.8)

## 2015-02-03 LAB — BASIC METABOLIC PANEL
BUN/Creatinine Ratio: 23 (ref 11–26)
BUN: 18 mg/dL (ref 8–27)
CO2: 24 mmol/L (ref 18–29)
Calcium: 9.6 mg/dL (ref 8.7–10.3)
Chloride: 100 mmol/L (ref 97–108)
Creatinine, Ser: 0.8 mg/dL (ref 0.57–1.00)
GFR calc Af Amer: 87 mL/min/{1.73_m2} (ref >59)
GFR calc non Af Amer: 75 mL/min/{1.73_m2} (ref >59)
Glucose: 98 mg/dL (ref 65–99)
Potassium: 3.7 mmol/L (ref 3.5–5.2)
Sodium: 141 mmol/L (ref 134–144)

## 2015-02-03 LAB — LIPID PANEL
Chol/HDL Ratio: 2.7 ratio units (ref 0.0–4.4)
Cholesterol, Total: 189 mg/dL (ref 100–199)
HDL: 70 mg/dL (ref >39)
LDL Calculated: 103 mg/dL — ABNORMAL HIGH (ref 0–99)
Triglycerides: 79 mg/dL (ref 0–149)
VLDL Cholesterol Cal: 16 mg/dL (ref 5–40)

## 2015-02-04 ENCOUNTER — Ambulatory Visit (INDEPENDENT_AMBULATORY_CARE_PROVIDER_SITE_OTHER): Payer: Medicare Other | Admitting: Internal Medicine

## 2015-02-04 ENCOUNTER — Encounter: Payer: Self-pay | Admitting: Internal Medicine

## 2015-02-04 VITALS — BP 108/78 | HR 71 | Temp 97.9°F | Resp 18 | Ht 62.0 in | Wt 118.4 lb

## 2015-02-04 DIAGNOSIS — M8580 Other specified disorders of bone density and structure, unspecified site: Secondary | ICD-10-CM

## 2015-02-04 DIAGNOSIS — M545 Low back pain, unspecified: Secondary | ICD-10-CM

## 2015-02-04 DIAGNOSIS — E785 Hyperlipidemia, unspecified: Secondary | ICD-10-CM | POA: Diagnosis not present

## 2015-02-04 DIAGNOSIS — S46002S Unspecified injury of muscle(s) and tendon(s) of the rotator cuff of left shoulder, sequela: Secondary | ICD-10-CM

## 2015-02-04 DIAGNOSIS — I10 Essential (primary) hypertension: Secondary | ICD-10-CM | POA: Diagnosis not present

## 2015-02-04 NOTE — Progress Notes (Signed)
Patient ID: Simeon CraftJuanita Drees, female   DOB: 1946/03/10, 69 y.o.   MRN: [id number]   Location[facility]  [facility]  Code Status: DNR Goals of Care: Advanced Directive information Does patient have an advance directive?: Yes, Type of Advance Directive: Healthcare Power of [place];Living will, Does patient want to make changes to advanced directive?: No - Patient declined   Allergies  Allergen Reactions  . Iodine   . Penicillins   . Shellfish Allergy   . Statins Other (See Comments)    High LFT's    Chief Complaint  Patient presents with  . Medical Management of Chronic Issues    6 month follow-up,Discuss labs (copy printed)    HPI: Patient is a 69 y.o.  seen in the office today for med mgt chronic diseases.  She's been trying to stay up with him late and then sleepy in the morning.  Does fine once she gets going. Dealing with allergies.  She loves the outdoors.  They have a whole house air cleaner.  Takes her cetirizine before bed for it.  It does help and mucinex bid.    Says she is 99.9% with her shoulder.    A little low back pain.  More she exercises, less pain.  Review of Systems:  Review of Systems  Constitutional: Negative for fever and chills.  HENT: Negative for congestion and hearing loss.   Eyes: Negative for blurred vision.  Respiratory: Negative for shortness of breath.   Cardiovascular: Negative for chest pain.  Gastrointestinal: Negative for abdominal pain, constipation, blood in stool and melena.  Genitourinary: Negative for dysuria, urgency and frequency.  Musculoskeletal: Negative for joint pain and falls.  Skin: Negative for rash.  Neurological: Negative for dizziness, loss of consciousness and headaches.  Psychiatric/Behavioral: Negative for depression and memory loss.     Past Medical History  Diagnosis Date  . Sebaceous cyst   . 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 Essential  hypertension, benign   . Premenopausal menorrhagia   . Disorder of bone and cartilage, unspecified   . Ganglion of tendon sheath   . Shortness of breath   . Lumbago   . Other abnormal blood chemistry   . Diverticulitis of colon (without mention of hemorrhage)   . Other and unspecified hyperlipidemia     Past Surgical History  Procedure Laterality Date  . Rotator cuff repair Left 10/06/13    tendon and ligament repair    Social History:   reports that she has quit smoking. She started smoking about 45 years ago. She does not have any smokeless tobacco history on file. She reports that she does not drink alcohol or use illicit drugs.  Family History  Problem Relation Age of Onset  . Lung cancer Mother   . Osteoporosis Mother   . Coronary artery disease Father     Medications: Patient's Medications  New Prescriptions   No medications on file  Previous Medications   ASPIRIN 81 MG TABLET    Take 81 mg by mouth daily. Take 1 tablet daily.   BLACK COHOSH 540 MG CAPS    Take 540 mg by mouth daily. Take 1 capsule once daily.   CALCIUM CARBONATE (OS-CAL) 600 MG TABS TABLET    Take 600 mg by mouth 2 (two) times daily with a meal. Take 1 tablet twice daily.   CETIRIZINE (ZYRTEC) 10 MG TABLET    Take 10 mg by mouth daily. Take  1 tablet once daily as needed for allergies.   CHOLECALCIFEROL (VITAMIN D) 400 UNITS TABS TABLET    Take 400 Units by mouth daily. Take 2 tablets once daily for bones.   CRESTOR 5 MG TABLET    TAKE ONE-HALF (1/2) TABLET AT BEDTIME ON MONDAY, WEDNESDAY, AND FRIDAY, AVOID GRAPEFRUIT PRODUCTS   GLUCOSAMINE HCL 1500 MG TABS    Take by mouth daily.   LISINOPRIL-HYDROCHLOROTHIAZIDE (PRINZIDE,ZESTORETIC) 10-12.5 MG PER TABLET    TAKE 1 TABLET DAILY   MULTIPLE VITAMINS-MINERALS (MULTIVITAMIN WITH MINERALS) TABLET    Take 1 tablet by mouth daily.   PROBIOTIC PRODUCT (PROBIOTIC DAILY PO)    Take by mouth. Insync: 1 by mouth daily   VITAMIN B-12 (CYANOCOBALAMIN) 500 MCG TABLET     Take 500 mcg by mouth daily.   VITAMIN C (ASCORBIC ACID) 500 MG TABLET    Take 500 mg by mouth daily.  Modified Medications   No medications on file  Discontinued Medications   No medications on file     Physical Exam: Filed Vitals:   02/04/15 1334  BP: 108/78  Pulse: 71  Temp: 97.9 F (36.6 C)  TempSrc: Oral  Resp: 18  Height:  (1.575 m)  Weight: 118 lb 6.4 oz (53.706 kg)  SpO2: 92%  Physical Exam  Constitutional: She is oriented to person, place, and time. She appears well-developed and well-nourished. No distress.  Cardiovascular: Normal rate, regular rhythm, normal heart sounds and intact distal pulses.   Pulmonary/Chest: Effort normal and breath sounds normal.  Abdominal: Soft. Bowel sounds are normal. She exhibits no distension and no mass. There is no tenderness.  Musculoskeletal: Normal range of motion. She exhibits no edema or tenderness.  Neurological: She is alert and oriented to person, place, and time.  Skin: Skin is warm and dry.  Psychiatric: She has a normal mood and affect.    Labs reviewed: Basic Metabolic Panel:  Recent Labs  16/10/96 0913 02/02/15 0931  NA 138 141  K 3.9 3.7  CL 99 100  CO2 24 24  GLUCOSE 101* 98  BUN 15 18  CREATININE 0.88 0.80  CALCIUM 9.7 9.6   Liver Function Tests:  Recent Labs  07/09/14 0913  AST 23  ALT 15  ALKPHOS 86  BILITOT 0.4  PROT 7.2   No results for input(s): LIPASE, AMYLASE in the last 8760 hours. No results for input(s): AMMONIA in the last 8760 hours. CBC:  Recent Labs  07/09/14 0913 02/02/15 0931  WBC 6.0 5.8  NEUTROABS 2.7 3.0  HGB 13.8 13.8  HCT 40.1 39.5  MCV 90 88   Lipid Panel:  Recent Labs  07/09/14 0913 02/02/15 0931  CHOL 215* 189  HDL 76 70  LDLCALC 124* 103*  TRIG 76 79  CHOLHDL 2.8 2.7    Assessment/Plan 1. Essential hypertension, benign -bp at goal with current therapy with ace/hctz  2. Hyperlipidemia -lipids at goal with low dose crestor and diet and  exercise  3. Osteopenia -cont vitamin D therapy and weightbearing exercise  4. Injury of left rotator cuff, sequela -has recovered very well and back to full use of her arm  5. Midline low back pain without sciatica -rarely having problems with this--if sticks to her exercise routine, she has no pain in her back  Labs/tests ordered:   Orders Placed This Encounter  Procedures  . CBC with Differential/Platelet    Standing Status: Future     Number of Occurrences:      Standing  Expiration Date: 02/04/2016  . Basic metabolic panel    Standing Status: Future     Number of Occurrences:      Standing Expiration Date: 02/04/2016    Order Specific Question:  Has the patient fasted?    Answer:  Yes  . Hemoglobin A1c    Standing Status: Future     Number of Occurrences:      Standing Expiration Date: 02/04/2016  . Lipid panel    Standing Status: Future     Number of Occurrences:      Standing Expiration Date: 02/04/2016    Order Specific Question:  Has the patient fasted?    Answer:  Yes    Next appt:  6 mos, labs before  Mavrick Mcquigg L. Rashawna Scoles, D.O. Geriatrics [facility] [address], [place] [address] Cell Phone (Mon-Fri 8am-5pm):  [phone] On Call:  [phone] & follow prompts after 5pm & weekends Office Phone:  [phone] Office Fax:  [phone]

## 2015-05-31 ENCOUNTER — Encounter: Payer: Self-pay | Admitting: Gastroenterology

## 2015-07-06 LAB — FECAL OCCULT BLOOD, GUAIAC: Fecal Occult Blood: NEGATIVE

## 2015-07-21 ENCOUNTER — Telehealth: Payer: Self-pay | Admitting: *Deleted

## 2015-07-21 NOTE — Telephone Encounter (Signed)
Received [facility] Colorectal Screening results and it was NEGATIVE per Dr. Renato Gails. To contact [facility] [phone] [facility] for patient to return call. Abstracted report and sent for scanning.

## 2015-08-09 ENCOUNTER — Telehealth: Payer: Self-pay | Admitting: *Deleted

## 2015-08-09 ENCOUNTER — Other Ambulatory Visit: Payer: Medicare Other

## 2015-08-09 DIAGNOSIS — E785 Hyperlipidemia, unspecified: Secondary | ICD-10-CM

## 2015-08-09 DIAGNOSIS — I10 Essential (primary) hypertension: Secondary | ICD-10-CM

## 2015-08-09 NOTE — Telephone Encounter (Signed)
Spoke with patient regarding her [facility] Colorectal Screening results, I informed her that the test was normal.

## 2015-08-10 LAB — BASIC METABOLIC PANEL
BUN/Creatinine Ratio: 22 (ref 11–26)
BUN: 19 mg/dL (ref 8–27)
CO2: 26 mmol/L (ref 18–29)
Calcium: 9.8 mg/dL (ref 8.7–10.3)
Chloride: 99 mmol/L (ref 97–108)
Creatinine, Ser: 0.86 mg/dL (ref 0.57–1.00)
GFR calc Af Amer: 80 mL/min/{1.73_m2} (ref >59)
GFR calc non Af Amer: 69 mL/min/{1.73_m2} (ref >59)
Glucose: 108 mg/dL — ABNORMAL HIGH (ref 65–99)
Potassium: 4.5 mmol/L (ref 3.5–5.2)
Sodium: 142 mmol/L (ref 134–144)

## 2015-08-10 LAB — CBC WITH DIFFERENTIAL/PLATELET
Basophils Absolute: 0 10*3/uL (ref 0.0–0.2)
Basos: 0 %
EOS (ABSOLUTE): 0.1 10*3/uL (ref 0.0–0.4)
Eos: 2 %
Hematocrit: 40.9 % (ref 34.0–46.6)
Hemoglobin: 14.1 g/dL (ref 11.1–15.9)
Immature Grans (Abs): 0 10*3/uL (ref 0.0–0.1)
Immature Granulocytes: 0 %
Lymphocytes Absolute: 2.6 10*3/uL (ref 0.7–3.1)
Lymphs: 41 %
MCH: 30.9 pg (ref 26.6–33.0)
MCHC: 34.5 g/dL (ref 31.5–35.7)
MCV: 90 fL (ref 79–97)
Monocytes Absolute: 0.5 10*3/uL (ref 0.1–0.9)
Monocytes: 7 %
Neutrophils Absolute: 3.1 10*3/uL (ref 1.4–7.0)
Neutrophils: 50 %
Platelets: 269 10*3/uL (ref 150–379)
RBC: 4.57 x10E6/uL (ref 3.77–5.28)
RDW: 14.8 % (ref 12.3–15.4)
WBC: 6.2 10*3/uL (ref 3.4–10.8)

## 2015-08-10 LAB — LIPID PANEL
Chol/HDL Ratio: 2.7 ratio units (ref 0.0–4.4)
Cholesterol, Total: 202 mg/dL — ABNORMAL HIGH (ref 100–199)
HDL: 74 mg/dL (ref >39)
LDL Calculated: 114 mg/dL — ABNORMAL HIGH (ref 0–99)
Triglycerides: 70 mg/dL (ref 0–149)
VLDL Cholesterol Cal: 14 mg/dL (ref 5–40)

## 2015-08-10 LAB — HEMOGLOBIN A1C
Est. average glucose Bld gHb Est-mCnc: 120 mg/dL
Hgb A1c MFr Bld: 5.8 % — ABNORMAL HIGH (ref 4.8–5.6)

## 2015-08-12 ENCOUNTER — Encounter: Payer: Self-pay | Admitting: Internal Medicine

## 2015-08-12 ENCOUNTER — Ambulatory Visit (INDEPENDENT_AMBULATORY_CARE_PROVIDER_SITE_OTHER): Payer: Medicare Other | Admitting: Internal Medicine

## 2015-08-12 VITALS — BP 106/72 | HR 77 | Temp 98.1°F | Resp 20 | Ht 62.0 in | Wt 115.4 lb

## 2015-08-12 DIAGNOSIS — I10 Essential (primary) hypertension: Secondary | ICD-10-CM

## 2015-08-12 DIAGNOSIS — E785 Hyperlipidemia, unspecified: Secondary | ICD-10-CM | POA: Diagnosis not present

## 2015-08-12 DIAGNOSIS — R739 Hyperglycemia, unspecified: Secondary | ICD-10-CM | POA: Diagnosis not present

## 2015-08-12 DIAGNOSIS — Z23 Encounter for immunization: Secondary | ICD-10-CM | POA: Diagnosis not present

## 2015-08-12 NOTE — Progress Notes (Signed)
Patient ID: Tamara Davidson, female   DOB: 04-Mar-1946, 69 y.o.   MRN: [id number]   Location: [facility] Provider: Gwenith Spitz. Renato Gails, D.O., C.M.D.  Code Status: DNR Goals of Care: Advanced Directive information Does patient have an advance directive?: Yes  Chief Complaint  Patient presents with  . Medical Management of Chronic Issues    6 month follow-up for Hypertension, Hyperlipidemia    HPI: Patient is a 69 y.o. female seen in the office today for medical mgt of chronic diseases including htn, hyperlipidemia, hyperglycemia.      On occasion, she's had some pain when she gets up or has been sitting in her heel.  Less since she's been wearing her orthotics in her shoes.  Has been gradually getting better.  No changes to exercise program except skipped two weeks.  Had been on a cruise and they were on the [employer].  Started back exercising 2 days ago.  She was walking up the steps on the ship.    HTN:  bp well controlled.  Hyperlipidemia:  Was naughty with eating on the cruise and knows she needs to bring it back down.  Hyperglycemia:  Noted hba1c 5.8.  Cruise messed up her glucose for a week.    Review of Systems:  Review of Systems  Constitutional: Negative for fever, chills and malaise/fatigue.  HENT: Negative for hearing loss.   Eyes: Negative for blurred vision.  Respiratory: Negative for shortness of breath.   Cardiovascular: Negative for chest pain.  Gastrointestinal: Negative for abdominal pain, constipation, blood in stool and melena.  Genitourinary: Negative for dysuria, urgency and frequency.  Musculoskeletal: Negative for falls.       Heel pain  Neurological: Negative for dizziness, weakness and headaches.  Endo/Heme/Allergies: Does not bruise/bleed easily.  Psychiatric/Behavioral: Negative for depression and memory loss.    Past Medical History  Diagnosis Date  . Sebaceous cyst   . 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  Essential hypertension, benign   . Premenopausal menorrhagia   . Disorder of bone and cartilage, unspecified   . Ganglion of tendon sheath   . Shortness of breath   . Lumbago   . Other abnormal blood chemistry   . Diverticulitis of colon (without mention of hemorrhage)   . Other and unspecified hyperlipidemia     Past Surgical History  Procedure Laterality Date  . Rotator cuff repair Left 10/06/13    tendon and ligament repair    Allergies  Allergen Reactions  . Iodine   . Penicillins   . Shellfish Allergy   . Statins Other (See Comments)    High LFT's      Medication List       This list is accurate as of: 08/12/15 10:52 AM.  Always use your most recent med list.               aspirin 81 MG tablet  Take 81 mg by mouth daily. Take 1 tablet daily.     Black Cohosh 540 MG Caps  Take 540 mg by mouth daily. Take 1 capsule once daily.     calcium carbonate 600 MG Tabs tablet  Commonly known as:  OS-CAL  Take 600 mg by mouth 2 (two) times daily with a meal. Take 1 tablet twice daily.     cetirizine 10 MG tablet  Commonly known as:  ZYRTEC  Take 10 mg by mouth daily. Take 1 tablet once daily as needed for allergies.  cholecalciferol 400 UNITS Tabs tablet  Commonly known as:  VITAMIN D  Take 400 Units by mouth daily. Take 2 tablets once daily for bones.     CRESTOR 5 MG tablet  Generic drug:  rosuvastatin  TAKE ONE-HALF (1/2) TABLET AT BEDTIME ON MONDAY, WEDNESDAY, AND FRIDAY, AVOID GRAPEFRUIT PRODUCTS     Glucosamine HCl 1500 MG Tabs  Take by mouth daily.     lisinopril-hydrochlorothiazide 10-12.5 MG tablet  Commonly known as:  PRINZIDE,ZESTORETIC  TAKE 1 TABLET DAILY     multivitamin with minerals tablet  Take 1 tablet by mouth daily.     PROBIOTIC DAILY PO  Take by mouth. Insync: 1 by mouth daily     vitamin B-12 500 MCG tablet  Commonly known as:  CYANOCOBALAMIN  Take 500 mcg by mouth daily.     vitamin C 500 MG tablet  Commonly known as:   ASCORBIC ACID  Take 500 mg by mouth daily.        Health Maintenance  Topic Date Due  . Hepatitis C Screening  27-May-1946  . INFLUENZA VACCINE  05/30/2016  . MAMMOGRAM  08/13/2016  . TETANUS/TDAP  10/14/2017  . COLONOSCOPY  06/24/2019  . DEXA SCAN  Completed  . ZOSTAVAX  Completed  . PNA vac Low Risk Adult  Completed    Physical Exam: Filed Vitals:   08/12/15 1012  BP: 106/72  Pulse: 77  Temp: 98.1 F (36.7 C)  TempSrc: Oral  Resp: 20  Height: 5\' 2"  (1.575 m)  Weight: 115 lb 6.4 oz (52.345 kg)  SpO2: 97%   Body mass index is 21.1 kg/(m^2). Physical Exam  Constitutional: She is oriented to person, place, and time. She appears well-developed and well-nourished. No distress.  Cardiovascular: Normal rate, regular rhythm, normal heart sounds and intact distal pulses.   Pulmonary/Chest: Effort normal and breath sounds normal. No respiratory distress.  Abdominal: Soft. Bowel sounds are normal. She exhibits no distension. There is no tenderness.  Musculoskeletal: Normal range of motion. She exhibits no edema or tenderness.  Neurological: She is alert and oriented to person, place, and time.  Skin: Skin is warm and dry.  Psychiatric: She has a normal mood and affect. Her behavior is normal. Judgment and thought content normal.    Labs reviewed: Basic Metabolic Panel:  Recent Labs  82/95/6203/03/15 0931 08/09/15 0912  NA 141 142  K 3.7 4.5  CL 100 99  CO2 24 26  GLUCOSE 98 108*  BUN 18 19  CREATININE 0.80 0.86  CALCIUM 9.6 9.8   Liver Function Tests: No results for input(s): AST, ALT, ALKPHOS, BILITOT, PROT, ALBUMIN in the last 8760 hours. No results for input(s): LIPASE, AMYLASE in the last 8760 hours. No results for input(s): AMMONIA in the last 8760 hours. CBC:  Recent Labs  02/02/15 0931 08/09/15 0912  WBC 5.8 6.2  NEUTROABS 3.0 3.1  HGB 13.8  --   HCT 39.5 40.9  MCV 88  --    Lipid Panel:  Recent Labs  02/02/15 0931 08/09/15 0912  CHOL 189 202*    HDL 70 74  LDLCALC 103* 114*  TRIG 79 70  CHOLHDL 2.7 2.7   Lab Results  Component Value Date   HGBA1C 5.8* 08/09/2015   Assessment/Plan 1. Essential hypertension, benign -bp is well controlled with walking, eating well, lisinopril/hctz  - Basic metabolic panel; Future  2. Hyperglycemia -glucose average has trended upward due to cruise, but she will work to lower this with continued exercise and  improved diet at home - Hemoglobin A1c; Future - Basic metabolic panel; Future  3. Hyperlipidemia - continues crestor with benefit--some trend upward also with her cruise in the recent past--if not at goal next check, would increase dose - Lipid panel; Future  4. Need for prophylactic vaccination and inoculation against influenza - Flu Vaccine QUAD 36+ mos PF IM (Fluarix & Fluzone Quad PF) given  Labs/tests ordered:   Orders Placed This Encounter  Procedures  . Flu Vaccine QUAD 36+ mos PF IM (Fluarix & Fluzone Quad PF)  . Hemoglobin A1c    Standing Status: Future     Number of Occurrences:      Standing Expiration Date: 08/11/2016  . Lipid panel    Standing Status: Future     Number of Occurrences:      Standing Expiration Date: 08/11/2016    Order Specific Question:  Has the patient fasted?    Answer:  Yes  . Basic metabolic panel    Standing Status: Future     Number of Occurrences:      Standing Expiration Date: 08/11/2016    Order Specific Question:  Has the patient fasted?    Answer:  Yes   Next appt:  6 mos for annual wellness exam/med mgt with labs before  Ikechukwu Cerny L. Keymani Mclean, D.O. Geriatrics [facility] [address], [place] [address] Cell Phone (Mon-Fri 8am-5pm):  [phone] On Call:  [phone] & follow prompts after 5pm & weekends Office Phone:  [phone] Office Fax:  [phone]

## 2015-11-01 ENCOUNTER — Other Ambulatory Visit: Payer: Self-pay | Admitting: Internal Medicine

## 2015-11-08 ENCOUNTER — Other Ambulatory Visit: Payer: Self-pay | Admitting: Internal Medicine

## 2015-11-10 ENCOUNTER — Encounter: Payer: Self-pay | Admitting: Internal Medicine

## 2016-02-07 ENCOUNTER — Other Ambulatory Visit: Payer: Medicare Other

## 2016-02-07 DIAGNOSIS — E785 Hyperlipidemia, unspecified: Secondary | ICD-10-CM

## 2016-02-07 DIAGNOSIS — I10 Essential (primary) hypertension: Secondary | ICD-10-CM

## 2016-02-07 DIAGNOSIS — R739 Hyperglycemia, unspecified: Secondary | ICD-10-CM

## 2016-02-08 ENCOUNTER — Other Ambulatory Visit: Payer: Medicare Other

## 2016-02-08 LAB — BASIC METABOLIC PANEL
BUN/Creatinine Ratio: 24 (ref 12–28)
BUN: 20 mg/dL (ref 8–27)
CO2: 28 mmol/L (ref 18–29)
Calcium: 9.5 mg/dL (ref 8.7–10.3)
Chloride: 99 mmol/L (ref 96–106)
Creatinine, Ser: 0.82 mg/dL (ref 0.57–1.00)
GFR calc Af Amer: 84 mL/min/{1.73_m2} (ref >59)
GFR calc non Af Amer: 73 mL/min/{1.73_m2} (ref >59)
Glucose: 95 mg/dL (ref 65–99)
Potassium: 4.2 mmol/L (ref 3.5–5.2)
Sodium: 141 mmol/L (ref 134–144)

## 2016-02-08 LAB — HEMOGLOBIN A1C
Est. average glucose Bld gHb Est-mCnc: 128 mg/dL
Hgb A1c MFr Bld: 6.1 % — ABNORMAL HIGH (ref 4.8–5.6)

## 2016-02-08 LAB — LIPID PANEL
Chol/HDL Ratio: 2.5 ratio units (ref 0.0–4.4)
Cholesterol, Total: 190 mg/dL (ref 100–199)
HDL: 76 mg/dL (ref >39)
LDL Calculated: 101 mg/dL — ABNORMAL HIGH (ref 0–99)
Triglycerides: 65 mg/dL (ref 0–149)
VLDL Cholesterol Cal: 13 mg/dL (ref 5–40)

## 2016-02-10 ENCOUNTER — Ambulatory Visit (INDEPENDENT_AMBULATORY_CARE_PROVIDER_SITE_OTHER): Payer: Medicare Other | Admitting: Internal Medicine

## 2016-02-10 ENCOUNTER — Encounter: Payer: Self-pay | Admitting: Internal Medicine

## 2016-02-10 VITALS — BP 120/68 | HR 73 | Temp 98.4°F | Ht 62.0 in | Wt 118.0 lb

## 2016-02-10 DIAGNOSIS — Z0000 Encounter for general adult medical examination without abnormal findings: Secondary | ICD-10-CM

## 2016-02-10 DIAGNOSIS — M159 Polyosteoarthritis, unspecified: Secondary | ICD-10-CM | POA: Diagnosis not present

## 2016-02-10 DIAGNOSIS — I10 Essential (primary) hypertension: Secondary | ICD-10-CM

## 2016-02-10 DIAGNOSIS — E785 Hyperlipidemia, unspecified: Secondary | ICD-10-CM | POA: Diagnosis not present

## 2016-02-10 DIAGNOSIS — R739 Hyperglycemia, unspecified: Secondary | ICD-10-CM

## 2016-02-10 DIAGNOSIS — M8580 Other specified disorders of bone density and structure, unspecified site: Secondary | ICD-10-CM

## 2016-02-10 DIAGNOSIS — B351 Tinea unguium: Secondary | ICD-10-CM

## 2016-02-10 NOTE — Addendum Note (Signed)
Addended by: Kermit BaloEED, Isamar Wellbrock L on: 02/10/2016 03:12 PM   Modules accepted: Orders

## 2016-02-10 NOTE — Progress Notes (Signed)
Patient ID: Tamara Davidson, female   DOB: April 16, 1946, 70 y.o.   MRN: [id number] MMSE 30/30 passed clock drawing

## 2016-02-10 NOTE — Progress Notes (Signed)
Patient ID: Tamara Davidson, female   DOB: 10-May-1946, 70 y.o.   MRN: [id number]   Location:  [facility] clinic Provider: Roylee Chaffin L. Renato Gails, D.O., C.M.D.  Patient Care Team: Kermit Balo, DO as PCP - General (Geriatric Medicine) Beverely Low, MD as Consulting Physician (Orthopedic Surgery) Mateo Flow, MD as Consulting Physician (Ophthalmology)  Extended Emergency Contact Information Primary Emergency Contact: Renninger,JOSEPH Address: [address]          [place] Home Phone: [phone] Relation: None  Code Status: DNR Goals of Care: Advanced Directive information Advanced Directives 02/10/2016  Does patient have an advance directive? Yes  Type of Advance Directive -  Does patient want to make changes to advanced directive? -  Copy of advanced directive(s) in chart? No - copy requested     Chief Complaint  Patient presents with  . Annual Exam    wellness exam  . MMSE    30/30 passed clock drawing    HPI: Patient is a 70 y.o. female seen in today for an annual wellness exam.    Depression screen [facility] 2/9 02/10/2016 02/04/2015 09/11/2013  Decreased Interest 0 0 0  Down, Depressed, Hopeless 0 0 0  PHQ - 2 Score 0 0 0    Fall Risk  02/10/2016 08/12/2015 02/04/2015 09/11/2013  Falls in the past year? No No No No   MMSE - Mini Mental State Exam 02/10/2016 07/13/2014  Orientation to time 5 5  Orientation to Place 5 5  Registration 3 3  Attention/ Calculation 5 5  Recall 3 2  Language- name 2 objects 2 2  Language- repeat 1 1  Language- follow 3 step command 3 3  Language- read & follow direction 1 1  Write a sentence 1 1  Copy design 1 1  Total score 30 29  passed clock   Health Maintenance  Topic Date Due  . Hepatitis C Screening  1946-06-21  . INFLUENZA VACCINE  05/30/2016  . MAMMOGRAM  08/13/2016  . TETANUS/TDAP  10/14/2017  . COLONOSCOPY  06/24/2019  . DEXA SCAN  Completed  . ZOSTAVAX  Completed  . PNA vac Low Risk Adult  Completed   Urinary  incontinence?  No difficulty Functional Status Survey:   Exercise?  Still exercising regularly.  [facility] [employer] and [facility] class. Diet?  Eating later at night, but otherwise still eating a regular healthy balanced diet.   Vision Screening Comments: Dr. Earlene Plater, [facility]  Hearing:  No difficulty Dentition:  No problems, got a filling replaced recently. Pain:  Hips have ben bothering her and the instep in her right foot.  Things it's arthur b/c he's in her neck and lower spine.  Not limiting movement or activity.  Has toenail fungus--tried vicks vaporub on them.  Has bunions also.  She keeps shaving down the nails.  Past Medical History  Diagnosis Date  . Sebaceous cyst   . 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 Essential hypertension, benign   . Premenopausal menorrhagia   . Disorder of bone and cartilage, unspecified   . Ganglion of tendon sheath   . Shortness of breath   . Lumbago   . Other abnormal blood chemistry   . Diverticulitis of colon (without mention of hemorrhage)   . Other and unspecified hyperlipidemia     Past Surgical History  Procedure Laterality Date  . Rotator cuff repair Left 10/06/13    tendon and ligament repair  Social History   Social History  . Marital Status: Married    Spouse Name: N/A  . Number of Children: N/A  . Years of Education: N/A   Occupational History  . Not on file.   Social History Main Topics  . Smoking status: Former Smoker -- 9 years    Start date: 10/30/1969  . Smokeless tobacco: Never Used  . Alcohol Use: No  . Drug Use: No  . Sexual Activity: Not on file   Other Topics Concern  . Not on file   Social History Narrative   Married 23 years, with Jomarie Longs for 29 years    Allergies  Allergen Reactions  . Iodine   . Penicillins   . Shellfish Allergy   . Statins Other (See Comments)    High LFT's      Medication List       This list is accurate as  of: 02/10/16  2:18 PM.  Always use your most recent med list.               aspirin 81 MG tablet  Take 81 mg by mouth daily. Take 1 tablet daily.     Black Cohosh 540 MG Caps  Take 540 mg by mouth daily. Take 1 capsule once daily.     calcium carbonate 600 MG Tabs tablet  Commonly known as:  OS-CAL  Take 600 mg by mouth 2 (two) times daily with a meal. Take 1 tablet twice daily.     cetirizine 10 MG tablet  Commonly known as:  ZYRTEC  Take 10 mg by mouth daily. Take 1 tablet once daily as needed for allergies.     cholecalciferol 400 units Tabs tablet  Commonly known as:  VITAMIN D  Take 400 Units by mouth daily. Take 2 tablets once daily for bones.     lisinopril-hydrochlorothiazide 10-12.5 MG tablet  Commonly known as:  PRINZIDE,ZESTORETIC  TAKE 1 TABLET DAILY     multivitamin with minerals tablet  Take 1 tablet by mouth daily.     PROBIOTIC DAILY PO  Take by mouth. Insync: 1 by mouth daily     rosuvastatin 5 MG tablet  Commonly known as:  CRESTOR  TAKE ONE-HALF (1/2) TABLET AT BEDTIME ON MONDAY, WEDNESDAY, AND FRIDAY (AVOID GRAPEFRUIT PRODUCTS)     Turmeric Curcumin 500 MG Caps  Take 1 capsule by mouth daily.     vitamin B-12 500 MCG tablet  Commonly known as:  CYANOCOBALAMIN  Take 500 mcg by mouth daily.     vitamin C 500 MG tablet  Commonly known as:  ASCORBIC ACID  Take 500 mg by mouth daily.         Review of Systems:  ROS  Physical Exam: Filed Vitals:   02/10/16 1336  BP: 120/68  Pulse: 73  Temp: 98.4 F (36.9 C)  TempSrc: Oral  Height:  (1.575 m)  Weight: 118 lb (53.524 kg)  SpO2: 97%   Body mass index is 21.58 kg/(m^2). Physical Exam  Labs reviewed: Basic Metabolic Panel:  Recent Labs  16/10/96 0912 02/07/16 0820  NA 142 141  K 4.5 4.2  CL 99 99  CO2 26 28  GLUCOSE 108* 95  BUN 19 20  CREATININE 0.86 0.82  CALCIUM 9.8 9.5   Liver Function Tests: No results for input(s): AST, ALT, ALKPHOS, BILITOT, PROT, ALBUMIN in  the last 8760 hours. No results for input(s): LIPASE, AMYLASE in the last 8760 hours. No results for input(s): AMMONIA in  the last 8760 hours. CBC:  Recent Labs  08/09/15 0912  WBC 6.2  NEUTROABS 3.1  HCT 40.9  MCV 90  PLT 269   Lipid Panel:  Recent Labs  08/09/15 0912 02/07/16 0820  CHOL 202* 190  HDL 74 76  LDLCALC 114* 101*  TRIG 70 65  CHOLHDL 2.7 2.5   Lab Results  Component Value Date   HGBA1C 6.1* 02/07/2016    Procedures: EKG today:  NSR at 65bpm  Assessment/Plan    Labs/tests ordered:  @ORDERS @ Next appt:  @NEXTENCTHIS  DEPT@  Milos Milligan L. Keidra Withers, D.O. Geriatrics [facility] [address] [place], [place] [address] Cell Phone (Mon-Fri 8am-5pm):  [phone] On Call:  [phone] & follow prompts after 5pm & weekends Office Phone:  [phone] Office Fax:  [phone]

## 2016-02-10 NOTE — Progress Notes (Signed)
Patient ID: Tamara Davidson, female   DOB: 05-Jul-1946, 70 y.o.   MRN: [id number] Accidentally closed note.  Review of Systems  Constitutional: Negative for fever, chills, weight loss and malaise/fatigue.  HENT: Negative for congestion and hearing loss.   Eyes: Negative for blurred vision.  Respiratory: Negative for cough and shortness of breath.   Cardiovascular: Negative for chest pain and leg swelling.  Gastrointestinal: Negative for abdominal pain, constipation, blood in stool and melena.  Genitourinary: Negative for dysuria, urgency and frequency.  Musculoskeletal: Positive for joint pain. Negative for myalgias, back pain and falls.  Skin: Negative for rash.       Toenail fungus  Neurological: Negative for dizziness, loss of consciousness and headaches.  Endo/Heme/Allergies: Does not bruise/bleed easily.  Psychiatric/Behavioral: Negative for depression and memory loss.   Physical Exam  Constitutional: She is oriented to person, place, and time. She appears well-developed and well-nourished. No distress.  HENT:  Head: Normocephalic and atraumatic.  Right Ear: External ear normal.  Left Ear: External ear normal.  Nose: Nose normal.  Mouth/Throat: Oropharynx is clear and moist. No oropharyngeal exudate.  Eyes: Conjunctivae and EOM are normal. Pupils are equal, round, and reactive to light.  Neck: Neck supple. No JVD present. No tracheal deviation present. No thyromegaly present.  Cardiovascular: Normal rate, regular rhythm, normal heart sounds and intact distal pulses.   Pulmonary/Chest: Effort normal and breath sounds normal. No respiratory distress. Right breast exhibits no inverted nipple, no mass, no nipple discharge, no skin change and no tenderness. Left breast exhibits no inverted nipple, no mass, no nipple discharge, no skin change and no tenderness.  Abdominal: Soft. Bowel sounds are normal. She exhibits no distension and no mass. There is no tenderness.  Musculoskeletal: Normal  range of motion. She exhibits tenderness. She exhibits no edema.  Over left hip with deep palpation  Lymphadenopathy:    She has no cervical adenopathy.  Neurological: She is alert and oriented to person, place, and time. No cranial nerve deficit.  1+ reflexes  Skin: Skin is warm and dry.  Psychiatric: She has a normal mood and affect. Her behavior is normal. Judgment and thought content normal.   Functional Status Survey: Is the patient deaf or have difficulty hearing?: No Does the patient have difficulty seeing, even when wearing glasses/contacts?: No Does the patient have difficulty concentrating, remembering, or making decisions?: No Does the patient have difficulty walking or climbing stairs?: No Does the patient have difficulty dressing or bathing?: No Does the patient have difficulty doing errands alone such as visiting a doctor's office or shopping?: No  See other note for details.    1. Medicare annual wellness visit, subsequent -up to date on all vaccines, preventive care  2. Essential hypertension - bp well controlled, no changes - EKG 12-Lead - Basic metabolic panel; Future  3. Hyperglycemia - sugar average up--says she needs to watch her portions probably so will work on that, continue her exercise - Hemoglobin A1c; Future - Basic metabolic panel; Future  4. Hyperlipidemia -lipids improved, cont crestor, diet, exercise - Lipid panel; Future  5. Osteopenia -cont ca with D and additional D -cont weightbearing exercise -recheck with next mammogram if we know when that is  6. Generalized osteoarthritis of multiple sites -reporting new aches in hips and right foot; has known oa already in her neck and lower back  7. Onychomycosis -discussed using otc funginail polish first and then if not effective, would try lamisil (she is worried about her liver)  F/u 6 mos for med mgt, labs before

## 2016-06-15 ENCOUNTER — Ambulatory Visit (INDEPENDENT_AMBULATORY_CARE_PROVIDER_SITE_OTHER): Payer: Medicare Other

## 2016-06-15 DIAGNOSIS — Z23 Encounter for immunization: Secondary | ICD-10-CM

## 2016-06-21 NOTE — Addendum Note (Signed)
Addended by: MAY, ANITA A on: 06/21/2016 11:58 AM   Modules accepted: Orders  

## 2016-08-04 ENCOUNTER — Other Ambulatory Visit: Payer: Self-pay | Admitting: Internal Medicine

## 2016-08-07 ENCOUNTER — Other Ambulatory Visit: Payer: Medicare Other

## 2016-08-07 DIAGNOSIS — E785 Hyperlipidemia, unspecified: Secondary | ICD-10-CM

## 2016-08-07 DIAGNOSIS — I10 Essential (primary) hypertension: Secondary | ICD-10-CM

## 2016-08-07 DIAGNOSIS — R739 Hyperglycemia, unspecified: Secondary | ICD-10-CM

## 2016-08-07 LAB — LIPID PANEL
Cholesterol: 187 mg/dL (ref 125–200)
HDL: 61 mg/dL (ref >=46)
LDL Cholesterol: 112 mg/dL (ref <130)
Total CHOL/HDL Ratio: 3.1 Ratio (ref <=5.0)
Triglycerides: 70 mg/dL (ref <150)
VLDL: 14 mg/dL (ref <30)

## 2016-08-07 LAB — BASIC METABOLIC PANEL
BUN: 18 mg/dL (ref 7–25)
CO2: 29 mmol/L (ref 20–31)
Calcium: 9.7 mg/dL (ref 8.6–10.4)
Chloride: 103 mmol/L (ref 98–110)
Creat: 0.87 mg/dL (ref 0.60–0.93)
Glucose, Bld: 94 mg/dL (ref 65–99)
Potassium: 4.2 mmol/L (ref 3.5–5.3)
Sodium: 141 mmol/L (ref 135–146)

## 2016-08-08 LAB — HEMOGLOBIN A1C
Hgb A1c MFr Bld: 5.7 % — ABNORMAL HIGH (ref <5.7)
Mean Plasma Glucose: 117 mg/dL

## 2016-08-10 ENCOUNTER — Encounter: Payer: Self-pay | Admitting: Internal Medicine

## 2016-08-10 ENCOUNTER — Encounter: Payer: Self-pay | Admitting: *Deleted

## 2016-08-10 ENCOUNTER — Ambulatory Visit (INDEPENDENT_AMBULATORY_CARE_PROVIDER_SITE_OTHER): Payer: Medicare Other | Admitting: Internal Medicine

## 2016-08-10 VITALS — BP 118/60 | HR 81 | Temp 98.5°F | Ht 60.5 in | Wt 120.0 lb

## 2016-08-10 DIAGNOSIS — E782 Mixed hyperlipidemia: Secondary | ICD-10-CM

## 2016-08-10 DIAGNOSIS — R739 Hyperglycemia, unspecified: Secondary | ICD-10-CM | POA: Diagnosis not present

## 2016-08-10 DIAGNOSIS — I10 Essential (primary) hypertension: Secondary | ICD-10-CM

## 2016-08-10 NOTE — Progress Notes (Signed)
Location:  [facility] clinic Provider:  Paxtyn Wisdom L. Renato Gails, D.O., C.M.D.  Code Status: DNR Goals of Care:  Advanced Directives 08/10/2016  Does patient have an advance directive? Yes  Type of Advance Directive -  Does patient want to make changes to advanced directive? -  Copy of advanced directive(s) in chart? No - copy requested   Chief Complaint  Patient presents with  . Medical Management of Chronic Issues    6 mth follow-up    HPI: Patient is a 70 y.o. female seen today for medical management of chronic diseases.    Shoulder about 98% now.  Bothers her when really damp and hot, but not cold.  Feels better if she exercises.  Still doing her [facility] routine.  Does yoga 2 days which is a new addition.  BP excellent.  No dizziness.  Sugar average has improved.  Has been monitoring her carb use.  LDL up a little.  HDL lower.  She's not sure what she's done besides their traveling.    Has gyn and mammo on 10/23.    Past Medical History:  Diagnosis Date  . Disorder of bone and cartilage, unspecified   . 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 Diverticulitis of colon (without mention of hemorrhage)(562.11)   . Essential hypertension, benign   . Ganglion of tendon sheath   . Lumbago   . Other abnormal blood chemistry   . Other and unspecified hyperlipidemia   . Premenopausal menorrhagia   . Sebaceous cyst   . Shortness of breath     Past Surgical History:  Procedure Laterality Date  . ROTATOR CUFF REPAIR Left 10/06/13   tendon and ligament repair    Allergies  Allergen Reactions  . Iodine   . Penicillins   . Shellfish Allergy   . Statins Other (See Comments)    High LFT's      Medication List       Accurate as of 08/10/16  2:42 PM. Always use your most recent med list.          aspirin 81 MG tablet Take 81 mg by mouth daily. Take 1 tablet daily.   Biotin 1000 MCG tablet Take 1,000 mcg by mouth daily.   Black Cohosh 540 MG  Caps Take 540 mg by mouth daily. Take 1 capsule once daily.   calcium carbonate 600 MG Tabs tablet Commonly known as:  OS-CAL Take 600 mg by mouth 2 (two) times daily with a meal. Take 1 tablet twice daily.   cetirizine 10 MG tablet Commonly known as:  ZYRTEC Take 10 mg by mouth daily. Take 1 tablet once daily as needed for allergies.   cholecalciferol 400 units Tabs tablet Commonly known as:  VITAMIN D Take 400 Units by mouth daily. Take 2 tablets once daily for bones.   lisinopril-hydrochlorothiazide 10-12.5 MG tablet Commonly known as:  PRINZIDE,ZESTORETIC TAKE 1 TABLET DAILY   multivitamin with minerals tablet Take 1 tablet by mouth daily.   PROBIOTIC DAILY PO Take by mouth. Insync: 1 by mouth daily   rosuvastatin 5 MG tablet Commonly known as:  CRESTOR TAKE ONE-HALF (1/2) TABLET AT BEDTIME ON MONDAY, WEDNESDAY, AND FRIDAY (AVOID GRAPEFRUIT PRODUCTS)   Turmeric Curcumin 500 MG Caps Take 1 capsule by mouth daily.   vitamin B-12 500 MCG tablet Commonly known as:  CYANOCOBALAMIN Take 500 mcg by mouth daily.   vitamin C 500 MG tablet Commonly known as:  ASCORBIC ACID Take 500 mg by  mouth daily.       Review of Systems:  Review of Systems  Constitutional: Negative for chills, fever and malaise/fatigue.  HENT: Negative for congestion and hearing loss.   Eyes: Negative for blurred vision.  Respiratory: Negative for cough and shortness of breath.   Cardiovascular: Negative for chest pain, palpitations and leg swelling.  Gastrointestinal: Negative for abdominal pain, blood in stool, constipation, heartburn and melena.  Genitourinary: Negative for dysuria, frequency and urgency.  Musculoskeletal: Negative for back pain, falls, joint pain and myalgias.  Skin: Negative for itching and rash.  Neurological: Negative for dizziness, sensory change, focal weakness, loss of consciousness, weakness and headaches.  Endo/Heme/Allergies: Does not bruise/bleed easily.   Psychiatric/Behavioral: Negative for depression and memory loss. The patient is not nervous/anxious and does not have insomnia.     Health Maintenance  Topic Date Due  . Hepatitis C Screening  [birth year]  . MAMMOGRAM  08/13/2016  . TETANUS/TDAP  10/14/2017  . COLONOSCOPY  06/24/2019  . INFLUENZA VACCINE  Completed  . DEXA SCAN  Completed  . ZOSTAVAX  Completed  . PNA vac Low Risk Adult  Completed    Physical Exam: Vitals:   08/10/16 1429  BP: 118/60  Pulse: 81  Temp: 98.5 F (36.9 C)  TempSrc: Oral  SpO2: 98%  Weight: 120 lb (54.4 kg)  Height: 5' 0.5" (1.537 m)   Body mass index is 23.05 kg/m. Physical Exam  Constitutional: She is oriented to person, place, and time. She appears well-developed and well-nourished.  Cardiovascular: Normal rate, regular rhythm, normal heart sounds and intact distal pulses.   Pulmonary/Chest: Effort normal and breath sounds normal. No respiratory distress.  Abdominal: Soft. Bowel sounds are normal.  Musculoskeletal: Normal range of motion.  Neurological: She is alert and oriented to person, place, and time.  Skin: Skin is warm and dry. Capillary refill takes less than 2 seconds.  Psychiatric: She has a normal mood and affect. Her behavior is normal. Judgment and thought content normal.    Labs reviewed: Basic Metabolic Panel:  Recent Labs  16/10/96 0820 08/07/16 0942  NA 141 141  K 4.2 4.2  CL 99 103  CO2 28 29  GLUCOSE 95 94  BUN 20 18  CREATININE 0.82 0.87  CALCIUM 9.5 9.7   Liver Function Tests: No results for input(s): AST, ALT, ALKPHOS, BILITOT, PROT, ALBUMIN in the last 8760 hours. No results for input(s): LIPASE, AMYLASE in the last 8760 hours. No results for input(s): AMMONIA in the last 8760 hours. CBC: No results for input(s): WBC, NEUTROABS, HGB, HCT, MCV, PLT in the last 8760 hours. Lipid Panel:  Recent Labs  02/07/16 0820 08/07/16 0942  CHOL 190 187  HDL 76 61  LDLCALC 101* 112  TRIG 65 70  CHOLHDL  2.5 3.1   Lab Results  Component Value Date   HGBA1C 5.7 (H) 08/07/2016   Assessment/Plan 1. Essential hypertension, benign - bp at goal, cont low sodium diet and exercise and lisinopril/hctz - COMPLETE METABOLIC PANEL WITH GFR; Future - CBC with Differential/Platelet; Future  2. Hyperglycemia - is mild - cont diet and exercise and monitor - Hemoglobin A1c; Future  3. Mixed hyperlipidemia - cont low dose crestor, diet and exercise and monitor - Lipid panel; Future  Labs/tests ordered:   Orders Placed This Encounter  Procedures  . COMPLETE METABOLIC PANEL WITH GFR    [facility]    Standing Status:   Future    Standing Expiration Date:   08/10/2017  .  CBC with Differential/Platelet    Standing Status:   Future    Standing Expiration Date:   08/10/2017  . Hemoglobin A1c    Standing Status:   Future    Standing Expiration Date:   08/10/2017  . Lipid panel    Standing Status:   Future    Standing Expiration Date:   08/10/2017    Order Specific Question:   Has the patient fasted?    Answer:   Yes    Next appt:  6 mos annual wellness/cpe  Santiago Stenzel L. Aadon Gorelik, D.O. Geriatrics [facility] [address] [place] , [place] [address] Cell Phone (Mon-Fri 8am-5pm):  [phone] On Call:  [phone] & follow prompts after 5pm & weekends Office Phone:  [phone] Office Fax:  [phone]

## 2016-08-24 ENCOUNTER — Ambulatory Visit
Admission: RE | Admit: 2016-08-24 | Discharge: 2016-08-24 | Disposition: A | Discharge status: Home / Self Care | Payer: Medicare Other | Source: Ambulatory Visit | Attending: Internal Medicine | Admitting: Internal Medicine

## 2016-08-24 ENCOUNTER — Encounter: Payer: Self-pay | Admitting: Internal Medicine

## 2016-08-24 ENCOUNTER — Ambulatory Visit (INDEPENDENT_AMBULATORY_CARE_PROVIDER_SITE_OTHER): Payer: Medicare Other | Admitting: Internal Medicine

## 2016-08-24 DIAGNOSIS — M542 Cervicalgia: Secondary | ICD-10-CM

## 2016-08-24 DIAGNOSIS — M545 Low back pain, unspecified: Secondary | ICD-10-CM

## 2016-08-24 DIAGNOSIS — R110 Nausea: Secondary | ICD-10-CM

## 2016-08-24 NOTE — Patient Instructions (Signed)
Use aleve twice a day for the inflammation and apply either heat or ice for 20 minute intervals to your sore areas.   Call me back if your pain is not resolving after another week.
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
## 2016-09-25 DIAGNOSIS — Z029 Encounter for administrative examinations, unspecified: Secondary | ICD-10-CM

## 2016-09-26 ENCOUNTER — Encounter: Payer: Self-pay | Admitting: Internal Medicine

## 2017-01-20 ENCOUNTER — Other Ambulatory Visit: Payer: Self-pay | Admitting: Internal Medicine

## 2017-01-31 ENCOUNTER — Other Ambulatory Visit: Payer: Self-pay | Admitting: Internal Medicine

## 2017-02-08 ENCOUNTER — Other Ambulatory Visit: Payer: Medicare Other

## 2017-02-08 DIAGNOSIS — R739 Hyperglycemia, unspecified: Secondary | ICD-10-CM

## 2017-02-08 DIAGNOSIS — I10 Essential (primary) hypertension: Secondary | ICD-10-CM

## 2017-02-08 DIAGNOSIS — E782 Mixed hyperlipidemia: Secondary | ICD-10-CM

## 2017-02-08 LAB — LIPID PANEL
Cholesterol: 173 mg/dL (ref <200)
HDL: 66 mg/dL (ref >50)
LDL Cholesterol: 87 mg/dL (ref <100)
Total CHOL/HDL Ratio: 2.6 Ratio (ref <5.0)
Triglycerides: 99 mg/dL (ref <150)
VLDL: 20 mg/dL (ref <30)

## 2017-02-08 LAB — COMPLETE METABOLIC PANEL WITH GFR
ALT: 15 U/L (ref 6–29)
AST: 17 U/L (ref 10–35)
Albumin: 4.1 g/dL (ref 3.6–5.1)
Alkaline Phosphatase: 70 U/L (ref 33–130)
BUN: 17 mg/dL (ref 7–25)
CO2: 31 mmol/L (ref 20–31)
Calcium: 9.4 mg/dL (ref 8.6–10.4)
Chloride: 102 mmol/L (ref 98–110)
Creat: 0.86 mg/dL (ref 0.60–0.93)
GFR, Est African American: 79 mL/min (ref >=60)
GFR, Est Non African American: 68 mL/min (ref >=60)
Glucose, Bld: 95 mg/dL (ref 65–99)
Potassium: 4.1 mmol/L (ref 3.5–5.3)
Sodium: 140 mmol/L (ref 135–146)
Total Bilirubin: 0.4 mg/dL (ref 0.2–1.2)
Total Protein: 7.2 g/dL (ref 6.1–8.1)

## 2017-02-08 LAB — CBC WITH DIFFERENTIAL/PLATELET
Basophils Absolute: 0 cells/uL (ref 0–200)
Basophils Relative: 0 %
Eosinophils Absolute: 104 cells/uL (ref 15–500)
Eosinophils Relative: 2 %
HCT: 39.9 % (ref 35.0–45.0)
Hemoglobin: 13.4 g/dL (ref 11.7–15.5)
Lymphocytes Relative: 41 %
Lymphs Abs: 2132 cells/uL (ref 850–3900)
MCH: 30.4 pg (ref 27.0–33.0)
MCHC: 33.6 g/dL (ref 32.0–36.0)
MCV: 90.5 fL (ref 80.0–100.0)
MPV: 9.5 fL (ref 7.5–12.5)
Monocytes Absolute: 364 cells/uL (ref 200–950)
Monocytes Relative: 7 %
Neutro Abs: 2600 cells/uL (ref 1500–7800)
Neutrophils Relative %: 50 %
Platelets: 235 10*3/uL (ref 140–400)
RBC: 4.41 MIL/uL (ref 3.80–5.10)
RDW: 14.3 % (ref 11.0–15.0)
WBC: 5.2 10*3/uL (ref 3.8–10.8)

## 2017-02-09 LAB — HEMOGLOBIN A1C
Hgb A1c MFr Bld: 5.5 % (ref <5.7)
Mean Plasma Glucose: 111 mg/dL

## 2017-02-12 ENCOUNTER — Ambulatory Visit: Payer: Medicare Other

## 2017-02-12 ENCOUNTER — Encounter: Payer: Medicare Other | Admitting: Internal Medicine

## 2017-03-14 ENCOUNTER — Telehealth: Payer: Self-pay | Admitting: Internal Medicine

## 2017-03-14 NOTE — Telephone Encounter (Signed)
I left a message asking the patient if she can come on 03/21/17 instead of 03/22/17 for her AWV with the nurse. VDM (DD)

## 2017-03-21 ENCOUNTER — Ambulatory Visit (INDEPENDENT_AMBULATORY_CARE_PROVIDER_SITE_OTHER): Payer: Medicare Other

## 2017-03-21 ENCOUNTER — Ambulatory Visit: Payer: Medicare Other

## 2017-03-21 VITALS — BP 110/58 | HR 74 | Temp 98.0°F | Ht 61.0 in | Wt 119.0 lb

## 2017-03-21 DIAGNOSIS — Z0000 Encounter for general adult medical examination without abnormal findings: Secondary | ICD-10-CM

## 2017-03-21 NOTE — Patient Instructions (Signed)
Tamara Davidson , Thank you for taking time to come for your Medicare Wellness Visit. I appreciate your ongoing commitment to your health goals. Please review the following plan we discussed and let me know if I can assist you in the future.   Screening recommendations/referrals: Colonoscopy up to date. Due 01/06/2013 Mammogram up to date. Due 08/21/2017 Bone Density up to date Recommended yearly ophthalmology/optometry visit for glaucoma screening and checkup Recommended yearly dental visit for hygiene and checkup  Vaccinations: Influenza vaccine up to date. Due 06/15/2017 Pneumococcal vaccine up to date Tdap vaccine up to date. Due 10/14/2017 Shingles vaccine up to date. If you want the new vaccine let us know and we will send prescription to pharmacy  Advanced directives: Please bring us a copy  Conditions/risks identified: None  Next appointment: Renato Gailseed 03/30/17 @ 9am   Preventive Care 65 Years and Older, Female Preventive care refers to lifestyle choices and visits with your health care provider that can promote health and wellness. What does preventive care include?  A yearly physical exam. This is also called an annual well check.  Dental exams once or twice a year.  Routine eye exams. Ask your health care provider how often you should have your eyes checked.  Personal lifestyle choices, including:  Daily care of your teeth and gums.  Regular physical activity.  Eating a healthy diet.  Avoiding tobacco and drug use.  Limiting alcohol use.  Practicing safe sex.  Taking low-dose aspirin every day.  Taking vitamin and mineral supplements as recommended by your health care provider. What happens during an annual well check? The services and screenings done by your health care provider during your annual well check will depend on your age, overall health, lifestyle risk factors, and family history of disease. Counseling  Your health care provider may ask you questions  about your:  Alcohol use.  Tobacco use.  Drug use.  Emotional well-being.  Home and relationship well-being.  Sexual activity.  Eating habits.  History of falls.  Memory and ability to understand (cognition).  Work and work Astronomerenvironment.  Reproductive health. Screening  You may have the following tests or measurements:  Height, weight, and BMI.  Blood pressure.  Lipid and cholesterol levels. These may be checked every 5 years, or more frequently if you are over 71 years old.  Skin check.  Lung cancer screening. You may have this screening every year starting at age 255 if you have a 30-pack-year history of smoking and currently smoke or have quit within the past 15 years.  Fecal occult blood test (FOBT) of the stool. You may have this test every year starting at age 71.  Flexible sigmoidoscopy or colonoscopy. You may have a sigmoidoscopy every 5 years or a colonoscopy every 10 years starting at age 71.  Hepatitis C blood test.  Hepatitis B blood test.  Sexually transmitted disease (STD) testing.  Diabetes screening. This is done by checking your blood sugar (glucose) after you have not eaten for a while (fasting). You may have this done every 1-3 years.  Bone density scan. This is done to screen for osteoporosis. You may have this done starting at age 71.  Mammogram. This may be done every 1-2 years. Talk to your health care provider about how often you should have regular mammograms. Talk with your health care provider about your test results, treatment options, and if necessary, the need for more tests. Vaccines  Your health care provider may recommend certain vaccines, such as:  Influenza vaccine. This is recommended every year.  Tetanus, diphtheria, and acellular pertussis (Tdap, Td) vaccine. You may need a Td booster every 10 years.  Zoster vaccine. You may need this after age 19.  Pneumococcal 13-valent conjugate (PCV13) vaccine. One dose is  recommended after age 32.  Pneumococcal polysaccharide (PPSV23) vaccine. One dose is recommended after age 51. Talk to your health care provider about which screenings and vaccines you need and how often you need them. This information is not intended to replace advice given to you by your health care provider. Make sure you discuss any questions you have with your health care provider. Document Released: 11/12/2015 Document Revised: 07/05/2016 Document Reviewed: 08/17/2015 Elsevier Interactive Patient Education  2017 [place] Prevention in the Home Falls can cause injuries. They can happen to people of all ages. There are many things you can do to make your home safe and to help prevent falls. What can I do on the outside of my home?  Regularly fix the edges of walkways and driveways and fix any cracks.  Remove anything that might make you trip as you walk through a door, such as a raised step or threshold.  Trim any bushes or trees on the path to your home.  Use bright outdoor lighting.  Clear any walking paths of anything that might make someone trip, such as rocks or tools.  Regularly check to see if handrails are loose or broken. Make sure that both sides of any steps have handrails.  Any raised decks and porches should have guardrails on the edges.  Have any leaves, snow, or ice cleared regularly.  Use sand or salt on walking paths during winter.  Clean up any spills in your garage right away. This includes oil or grease spills. What can I do in the bathroom?  Use night lights.  Install grab bars by the toilet and in the tub and shower. Do not use towel bars as grab bars.  Use non-skid mats or decals in the tub or shower.  If you need to sit down in the shower, use a plastic, non-slip stool.  Keep the floor dry. Clean up any water that spills on the floor as soon as it happens.  Remove soap buildup in the tub or shower regularly.  Attach bath mats  securely with double-sided non-slip rug tape.  Do not have throw rugs and other things on the floor that can make you trip. What can I do in the bedroom?  Use night lights.  Make sure that you have a light by your bed that is easy to reach.  Do not use any sheets or blankets that are too big for your bed. They should not hang down onto the floor.  Have a firm chair that has side arms. You can use this for support while you get dressed.  Do not have throw rugs and other things on the floor that can make you trip. What can I do in the kitchen?  Clean up any spills right away.  Avoid walking on wet floors.  Keep items that you use a lot in easy-to-reach places.  If you need to reach something above you, use a strong step stool that has a grab bar.  Keep electrical cords out of the way.  Do not use floor polish or wax that makes floors slippery. If you must use wax, use non-skid floor wax.  Do not have throw rugs and other things on the floor that  can make you trip. What can I do with my stairs?  Do not leave any items on the stairs.  Make sure that there are handrails on both sides of the stairs and use them. Fix handrails that are broken or loose. Make sure that handrails are as long as the stairways.  Check any carpeting to make sure that it is firmly attached to the stairs. Fix any carpet that is loose or worn.  Avoid having throw rugs at the top or bottom of the stairs. If you do have throw rugs, attach them to the floor with carpet tape.  Make sure that you have a light switch at the top of the stairs and the bottom of the stairs. If you do not have them, ask someone to add them for you. What else can I do to help prevent falls?  Wear shoes that:  Do not have high heels.  Have rubber bottoms.  Are comfortable and fit you well.  Are closed at the toe. Do not wear sandals.  If you use a stepladder:  Make sure that it is fully opened. Do not climb a closed  stepladder.  Make sure that both sides of the stepladder are locked into place.  Ask someone to hold it for you, if possible.  Clearly mark and make sure that you can see:  Any grab bars or handrails.  First and last steps.  Where the edge of each step is.  Use tools that help you move around (mobility aids) if they are needed. These include:  Canes.  Walkers.  Scooters.  Crutches.  Turn on the lights when you go into a dark area. Replace any light bulbs as soon as they burn out.  Set up your furniture so you have a clear path. Avoid moving your furniture around.  If any of your floors are uneven, fix them.  If there are any pets around you, be aware of where they are.  Review your medicines with your doctor. Some medicines can make you feel dizzy. This can increase your chance of falling. Ask your doctor what other things that you can do to help prevent falls. This information is not intended to replace advice given to you by your health care provider. Make sure you discuss any questions you have with your health care provider. Document Released: 08/12/2009 Document Revised: 03/23/2016 Document Reviewed: 11/20/2014 Elsevier Interactive Patient Education  2017 [employer].

## 2017-03-21 NOTE — Progress Notes (Signed)
Subjective:   Tamara Davidson is a 71 y.o. female who presents for Medicare Annual (Subsequent) preventive examination.     Objective:     Vitals: BP (!) 110/58 (BP Location: Left Arm, Patient Position: Sitting)   Pulse 74   Temp 98 F (36.7 C) (Oral)   Ht 5\' 1"  (1.549 m)   Wt 119 lb (54 kg)   BMI 22.48 kg/m   Body mass index is 22.48 kg/m.   Tobacco History  Smoking Status  . Former Smoker  . Packs/day: 1.00  . Years: 5.00  . Start date: 10/30/1969  Smokeless Tobacco  . Never Used     Counseling given: Not Answered   Past Medical History:  Diagnosis Date  . Disorder of bone and cartilage, unspecified   . 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 Diverticulitis of colon (without mention of hemorrhage)(562.11)   . Essential hypertension, benign   . Ganglion of tendon sheath   . Lumbago   . Other abnormal blood chemistry   . Other and unspecified hyperlipidemia   . Premenopausal menorrhagia   . Sebaceous cyst   . Shortness of breath    Past Surgical History:  Procedure Laterality Date  . ROTATOR CUFF REPAIR Left 10/06/13   tendon and ligament repair   Family History  Problem Relation Age of Onset  . Lung cancer Mother   . Osteoporosis Mother   . Coronary artery disease Father    History  Sexual Activity  . Sexual activity: Not on file    Outpatient Encounter Prescriptions as of 03/21/2017  Medication Sig  . aspirin 81 MG tablet Take 81 mg by mouth daily. Take 1 tablet daily.  . Biotin 1000 MCG tablet Take 1,000 mcg by mouth daily.  . Black Cohosh 540 MG CAPS Take 540 mg by mouth daily. Take 1 capsule once daily.  . calcium carbonate (OS-CAL) 600 MG TABS tablet Take 600 mg by mouth 2 (two) times daily with a meal. Take 1 tablet twice daily.  . cetirizine (ZYRTEC) 10 MG tablet Take 10 mg by mouth daily. Take 1 tablet once daily as needed for allergies.  . cholecalciferol (VITAMIN D) 400 UNITS TABS tablet Take 400 Units by mouth  daily. Take 2 tablets once daily for bones.  . Glucosamine-Chondroit-Vit C-Mn (GLUCOSAMINE CHONDR 1500 COMPLX PO) Take by mouth daily.  Marland Kitchen guaiFENesin (MUCINEX) 600 MG 12 hr tablet Take by mouth 2 (two) times daily.  Marland Kitchen lisinopril-hydrochlorothiazide (PRINZIDE,ZESTORETIC) 10-12.5 MG tablet TAKE 1 TABLET DAILY  . Multiple Vitamins-Minerals (MULTIVITAMIN WITH MINERALS) tablet Take 1 tablet by mouth daily.  Marland Kitchen PREMARIN vaginal cream   . Probiotic Product (PROBIOTIC DAILY PO) Take by mouth. Insync: 1 by mouth daily  . rosuvastatin (CRESTOR) 5 MG tablet TAKE ONE-HALF (1/2) TABLET AT BEDTIME ON MONDAY, WEDNESDAY, AND FRIDAY (AVOID GRAPEFRUIT PRODUCTS)  . vitamin B-12 (CYANOCOBALAMIN) 500 MCG tablet Take 500 mcg by mouth daily.  . vitamin C (ASCORBIC ACID) 500 MG tablet Take 500 mg by mouth daily.  . [DISCONTINUED] Turmeric Curcumin 500 MG CAPS Take 1 capsule by mouth daily.   No facility-administered encounter medications on file as of 03/21/2017.     Activities of Daily Living In your present state of health, do you have any difficulty performing the following activities: 03/21/2017  Hearing? N  Vision? N  Difficulty concentrating or making decisions? N  Walking or climbing stairs? N  Dressing or bathing? N  Doing errands, shopping? N  Preparing  Food and eating ? N  Using the Toilet? N  In the past six months, have you accidently leaked urine? N  Do you have problems with loss of bowel control? N  Managing your Medications? N  Managing your Finances? N  Housekeeping or managing your Housekeeping? N  Some recent data might be hidden    Patient Care Team: Kermit Baloeed, Tiffany L, DO as PCP - General (Geriatric Medicine) Beverely LowNorris, Steve, MD as Consulting Physician (Orthopedic Surgery) Mateo FlowHecker, Kathryn, MD as Consulting Physician (Ophthalmology)    Assessment:     Exercise Activities and Dietary recommendations Current Exercise Habits: Home exercise routine, Type of exercise: Other - see comments  (cardio, aerobics, chair yoga), Time (Minutes): 45, Frequency (Times/Week): 3, Weekly Exercise (Minutes/Week): 135, Intensity: Moderate, Exercise limited by: None identified  Goals    . Lose Belly Fat          Starting today pt will increase gym days to 4 days a week.       Fall Risk Fall Risk  03/21/2017 02/10/2016 08/12/2015 02/04/2015 09/11/2013  Falls in the past year? Yes No No No No  Number falls in past yr: 1 - - - -  Injury with Fall? Yes - - - -   Depression Screen PHQ 2/9 Scores 03/21/2017 02/10/2016 02/04/2015 09/11/2013  PHQ - 2 Score 0 0 0 0     Cognitive Function MMSE - Mini Mental State Exam 03/21/2017 02/10/2016 07/13/2014  Orientation to time 5 5 5   Orientation to Place 5 5 5   Registration 3 3 3   Attention/ Calculation 5 5 5   Recall 3 3 2   Language- name 2 objects 2 2 2   Language- repeat 1 1 1   Language- follow 3 step command 3 3 3   Language- read & follow direction 1 1 1   Write a sentence 1 1 1   Copy design 1 1 1   Total score 30 30 29         Immunization History  Administered Date(s) Administered  . Influenza Whole 07/28/2010, 07/11/2012  . Influenza,inj,Quad PF,36+ Mos 07/13/2014, 08/12/2015, 06/15/2016  . Influenza-Unspecified 07/30/2013  . Pneumococcal Conjugate-13 10/08/2014, 10/09/2014  . Pneumococcal Polysaccharide-23 12/06/2010  . Tdap 10/15/2007  . Zoster 01/25/2013   Screening Tests Health Maintenance  Topic Date Due  . Hepatitis C Screening  July 06, 1946  . MAMMOGRAM  08/13/2016  . INFLUENZA VACCINE  05/30/2017  . TETANUS/TDAP  10/14/2017  . COLONOSCOPY  06/24/2019  . DEXA SCAN  Completed  . PNA vac Low Risk Adult  Completed      Plan:   I have personally reviewed and addressed the Medicare Annual Wellness questionnaire and have noted the following in the patient's chart:  A. Medical and social history B. Use of alcohol, tobacco or illicit drugs  C. Current medications and supplements D. Functional ability and status E.  Nutritional  status F.  Physical activity G. Advance directives H. List of other physicians I.  Hospitalizations, surgeries, and ER visits in previous 12 months J.  Vitals K. Screenings to include hearing, vision, cognitive, depression L. Referrals and appointments - none  In addition, I have reviewed and discussed with patient certain preventive protocols, quality metrics, and best practice recommendations. A written personalized care plan for preventive services as well as general preventive health recommendations were provided to patient.  See attached scanned questionnaire for additional information.   Signed,   Annetta MawSara Gonthier, RN Nurse Health Advisor   I have reviewed the health advisor's note and was available for  consultation. I agree with documentation and plan.   Breiana Stratmann S. Ancil Linsey  [facility] and Adult Medicine [address] [place], [place] [address] [phone] Cell (Monday-Friday 8 AM - 5 PM) [phone] After 5 PM and follow prompts

## 2017-03-22 ENCOUNTER — Ambulatory Visit: Payer: Medicare Other

## 2017-03-30 ENCOUNTER — Encounter: Payer: Self-pay | Admitting: Internal Medicine

## 2017-03-30 ENCOUNTER — Ambulatory Visit (INDEPENDENT_AMBULATORY_CARE_PROVIDER_SITE_OTHER): Payer: Medicare Other | Admitting: Internal Medicine

## 2017-03-30 VITALS — BP 104/62 | HR 65 | Temp 98.4°F | Ht 61.0 in | Wt 119.0 lb

## 2017-03-30 DIAGNOSIS — R739 Hyperglycemia, unspecified: Secondary | ICD-10-CM | POA: Diagnosis not present

## 2017-03-30 DIAGNOSIS — I10 Essential (primary) hypertension: Secondary | ICD-10-CM | POA: Diagnosis not present

## 2017-03-30 DIAGNOSIS — E7800 Pure hypercholesterolemia, unspecified: Secondary | ICD-10-CM

## 2017-03-30 DIAGNOSIS — Z23 Encounter for immunization: Secondary | ICD-10-CM | POA: Diagnosis not present

## 2017-03-30 DIAGNOSIS — Z0000 Encounter for general adult medical examination without abnormal findings: Secondary | ICD-10-CM

## 2017-03-30 DIAGNOSIS — Z9189 Other specified personal risk factors, not elsewhere classified: Secondary | ICD-10-CM

## 2017-03-30 DIAGNOSIS — S4991XA Unspecified injury of right shoulder and upper arm, initial encounter: Secondary | ICD-10-CM | POA: Diagnosis not present

## 2017-03-30 DIAGNOSIS — Z1159 Encounter for screening for other viral diseases: Secondary | ICD-10-CM

## 2017-03-30 MED ORDER — ZOSTER VAC RECOMB ADJUVANTED 50 MCG/0.5ML IM SUSR: 0.5000 mL | Freq: Once | INTRAMUSCULAR | 1 refills | Status: AC
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
## 2017-04-19 ENCOUNTER — Other Ambulatory Visit: Payer: Self-pay | Admitting: Internal Medicine

## 2017-07-12 ENCOUNTER — Ambulatory Visit (INDEPENDENT_AMBULATORY_CARE_PROVIDER_SITE_OTHER): Payer: Medicare Other

## 2017-07-12 DIAGNOSIS — Z23 Encounter for immunization: Secondary | ICD-10-CM

## 2017-08-06 ENCOUNTER — Encounter: Payer: Self-pay | Admitting: Internal Medicine

## 2017-08-06 ENCOUNTER — Ambulatory Visit (INDEPENDENT_AMBULATORY_CARE_PROVIDER_SITE_OTHER): Payer: Medicare Other | Admitting: Internal Medicine

## 2017-08-06 VITALS — BP 110/68 | HR 70 | Temp 98.3°F | Resp 12 | Ht 61.0 in | Wt 117.0 lb

## 2017-08-06 DIAGNOSIS — Z01818 Encounter for other preprocedural examination: Secondary | ICD-10-CM

## 2017-08-06 DIAGNOSIS — E7800 Pure hypercholesterolemia, unspecified: Secondary | ICD-10-CM

## 2017-08-06 DIAGNOSIS — S46211A Strain of muscle, fascia and tendon of other parts of biceps, right arm, initial encounter: Secondary | ICD-10-CM

## 2017-08-06 DIAGNOSIS — S4381XA Sprain of other specified parts of right shoulder girdle, initial encounter: Secondary | ICD-10-CM | POA: Diagnosis not present

## 2017-08-06 DIAGNOSIS — I10 Essential (primary) hypertension: Secondary | ICD-10-CM | POA: Diagnosis not present

## 2017-08-06 DIAGNOSIS — S46811A Strain of other muscles, fascia and tendons at shoulder and upper arm level, right arm, initial encounter: Secondary | ICD-10-CM

## 2017-08-06 DIAGNOSIS — R739 Hyperglycemia, unspecified: Secondary | ICD-10-CM | POA: Diagnosis not present

## 2017-08-06 NOTE — Progress Notes (Signed)
Location:  [facility] clinic Provider:  Kailynn Satterly L. Renato Gails, D.O., C.M.D.  Code Status: DNR Goals of Care:  Advanced Directives 03/30/2017  Does Patient Have a Medical Advance Directive? Yes  Type of Estate agent of [place];Living will  Does patient want to make changes to medical advance directive? -  Copy of Healthcare Power of Attorney in Chart? Yes   Chief Complaint  Patient presents with  . Medical Clearance    for shoulder surgery   HPI: Patient is a 71 y.o. female seen today for medical clearance for right shoulder surgery with Dr. Ranell Patrick (not yet scheduled).    Tamara Davidson has been in generally good health.  We've kept an eye on her bp, lipids and glucose.  She exercises regularly.  She has no chest pain or shortness of breath.    BP is at goal.  On ace/hctz. Takes baby asa.   Last lipids with LDL of 87 and HDL of 66.  On crestor. Last hba1c normal at 5.5.  Past Medical History:  Diagnosis Date  . Disorder of bone and cartilage, unspecified   . 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 Diverticulitis of colon (without mention of hemorrhage)(562.11)   . Essential hypertension, benign   . Ganglion of tendon sheath   . Lumbago   . Other abnormal blood chemistry   . Other and unspecified hyperlipidemia   . Premenopausal menorrhagia   . Sebaceous cyst   . Shortness of breath     Past Surgical History:  Procedure Laterality Date  . ROTATOR CUFF REPAIR Left 10/06/13   tendon and ligament repair    Allergies  Allergen Reactions  . Iodine   . Penicillins   . Shellfish Allergy   . Statins Other (See Comments)    High LFT's    Outpatient Encounter Prescriptions as of 08/06/2017  Medication Sig  . aspirin 81 MG tablet Take 81 mg by mouth daily. Take 1 tablet daily.  . Biotin 1000 MCG tablet Take 1,000 mcg by mouth daily.  . calcium carbonate (OS-CAL) 600 MG TABS tablet Take 600 mg by mouth 2 (two) times daily with a meal. Take 1  tablet twice daily.  . cetirizine (ZYRTEC) 10 MG tablet Take 10 mg by mouth daily. Take 1 tablet once daily as needed for allergies.  . cholecalciferol (VITAMIN D) 400 UNITS TABS tablet Take 400 Units by mouth daily. Take 2 tablets once daily for bones.  . Glucosamine-Chondroit-Vit C-Mn (GLUCOSAMINE CHONDR 1500 COMPLX PO) Take by mouth daily.  Marland Kitchen guaiFENesin (MUCINEX) 600 MG 12 hr tablet Take by mouth 2 (two) times daily.  Marland Kitchen lisinopril-hydrochlorothiazide (PRINZIDE,ZESTORETIC) 10-12.5 MG tablet TAKE 1 TABLET DAILY  . Multiple Vitamins-Minerals (MULTIVITAMIN WITH MINERALS) tablet Take 1 tablet by mouth daily.  Marland Kitchen PREMARIN vaginal cream   . Probiotic Product (PROBIOTIC DAILY PO) Take by mouth. Insync: 1 by mouth daily  . rosuvastatin (CRESTOR) 5 MG tablet TAKE ONE-HALF (1/2) TABLET AT BEDTIME ON MONDAY, WEDNESDAY, AND FRIDAY (AVOID GRAPEFRUIT PRODUCTS)  . vitamin B-12 (CYANOCOBALAMIN) 500 MCG tablet Take 500 mcg by mouth daily.  . vitamin C (ASCORBIC ACID) 500 MG tablet Take 500 mg by mouth daily.   No facility-administered encounter medications on file as of 08/06/2017.     Review of Systems:  Review of Systems  Constitutional: Negative for chills and fever.  HENT: Negative for congestion and hearing loss.   Eyes: Negative for blurred vision.  Respiratory: Negative for shortness of breath.  Cardiovascular: Negative for chest pain and palpitations.  Gastrointestinal: Negative for abdominal pain, blood in stool, constipation and melena.  Genitourinary: Negative for dysuria, frequency and urgency.  Musculoskeletal: Positive for joint pain. Negative for falls.  Skin: Negative for itching and rash.  Neurological: Negative for dizziness.  Endo/Heme/Allergies: Does not bruise/bleed easily.  Psychiatric/Behavioral: Negative for depression and memory loss. The patient is not nervous/anxious and does not have insomnia.     Health Maintenance  Topic Date Due  . Hepatitis C Screening  [birth year]    . MAMMOGRAM  08/13/2016  . TETANUS/TDAP  10/14/2017  . COLONOSCOPY  06/24/2019  . INFLUENZA VACCINE  Completed  . DEXA SCAN  Completed  . PNA vac Low Risk Adult  Completed    Physical Exam: There were no vitals filed for this visit. There is no height or weight on file to calculate BMI. Physical Exam  Constitutional: She is oriented to person, place, and time. She appears well-developed and well-nourished. No distress.  HENT:  Head: Normocephalic and atraumatic.  Eyes: Pupils are equal, round, and reactive to light. EOM are normal.  Neck: Neck supple.  Cardiovascular: Normal rate, regular rhythm, normal heart sounds and intact distal pulses.   Pulmonary/Chest: Effort normal and breath sounds normal. No respiratory distress.  Abdominal: Bowel sounds are normal.  Musculoskeletal: She exhibits tenderness.  Over right anterior and lateral shoulder, decreased ROM with flexion and abduction  Neurological: She is alert and oriented to person, place, and time. She displays normal reflexes. No cranial nerve deficit or sensory deficit. She exhibits normal muscle tone. Coordination normal.  Skin: Skin is warm and dry.  Psychiatric: She has a normal mood and affect. Her behavior is normal. Judgment and thought content normal.    Labs reviewed: Basic Metabolic Panel:  Recent Labs  16/10/96 0942 02/08/17 1021  NA 141 140  K 4.2 4.1  CL 103 102  CO2 29 31  GLUCOSE 94 95  BUN 18 17  CREATININE 0.87 0.86  CALCIUM 9.7 9.4   Liver Function Tests:  Recent Labs  02/08/17 1021  AST 17  ALT 15  ALKPHOS 70  BILITOT 0.4  PROT 7.2  ALBUMIN 4.1   No results for input(s): LIPASE, AMYLASE in the last 8760 hours. No results for input(s): AMMONIA in the last 8760 hours. CBC:  Recent Labs  02/08/17 1021  WBC 5.2  NEUTROABS 2,600  HGB 13.4  HCT 39.9  MCV 90.5  PLT 235   Lipid Panel:  Recent Labs  08/07/16 0942 02/08/17 1021  CHOL 187 173  HDL 61 66  LDLCALC 112 87  TRIG  70 99  CHOLHDL 3.1 2.6   Lab Results  Component Value Date   HGBA1C 5.5 02/08/2017    Procedures since last visit: Normal EKG June 2018 on file  Assessment/Plan 1. Preoperative clearance -pt having a high risk surgery, but she is a low risk patient without any known CAD or diabetes, does have htn, hyperlipidemia and hyperglycemia but all well-controlled -advised to hold her baby asa 1 week before surgery and resume postop as permissible by Dr.Norris -may continue all other meds as usual (takes her bp and cholesterol at hs so no need to hold them morning of)  2. Partial tear subscapularis tendon, right, initial encounter -for repair  3. Biceps muscle tear, right, initial encounter -for repair  4. Essential hypertension, benign -bp at goal, cont ace/hctz  5. Pure hypercholesterolemia -LDL at goal, cont crestor   6. Hyperglycemia -hba1c now  normal, cont diet and exercise  Labs/tests ordered:  No new today Next appt:  10/01/2017 as scheduled CC:  Dr. Janie Morning form signed and placed to be faxed, also, call with questions.   Tamara Davidson L. Robel Wuertz, D.O. Geriatrics [facility] [address], [place] [address] Cell Phone (Mon-Fri 8am-5pm):  [phone] On Call:  [phone] & follow prompts after 5pm & weekends Office Phone:  [phone] Office Fax:  [phone]

## 2017-08-06 NOTE — Patient Instructions (Signed)
Best wishes with your surgery and recovery.   Hold your aspirin for 7 days preop.  Resume when Ok'd by Dr. Ranell Patrick.

## 2017-08-22 LAB — HM MAMMOGRAPHY

## 2017-10-01 ENCOUNTER — Other Ambulatory Visit: Payer: Medicare Other

## 2017-10-01 DIAGNOSIS — E7800 Pure hypercholesterolemia, unspecified: Secondary | ICD-10-CM

## 2017-10-01 DIAGNOSIS — R739 Hyperglycemia, unspecified: Secondary | ICD-10-CM

## 2017-10-01 DIAGNOSIS — Z1159 Encounter for screening for other viral diseases: Secondary | ICD-10-CM

## 2017-10-01 DIAGNOSIS — I10 Essential (primary) hypertension: Secondary | ICD-10-CM

## 2017-10-01 DIAGNOSIS — Z9189 Other specified personal risk factors, not elsewhere classified: Secondary | ICD-10-CM

## 2017-10-01 DIAGNOSIS — Z0000 Encounter for general adult medical examination without abnormal findings: Secondary | ICD-10-CM

## 2017-10-02 LAB — BASIC METABOLIC PANEL
BUN: 13 mg/dL (ref 7–25)
CO2: 28 mmol/L (ref 20–32)
Calcium: 9.5 mg/dL (ref 8.6–10.4)
Chloride: 103 mmol/L (ref 98–110)
Creat: 0.77 mg/dL (ref 0.60–0.93)
Glucose, Bld: 98 mg/dL (ref 65–99)
Potassium: 4 mmol/L (ref 3.5–5.3)
Sodium: 141 mmol/L (ref 135–146)

## 2017-10-02 LAB — CBC WITH DIFFERENTIAL/PLATELET
Basophils Absolute: 21 cells/uL (ref 0–200)
Basophils Relative: 0.4 %
Eosinophils Absolute: 192 cells/uL (ref 15–500)
Eosinophils Relative: 3.7 %
HCT: 35.3 % (ref 35.0–45.0)
Hemoglobin: 12.1 g/dL (ref 11.7–15.5)
Lymphs Abs: 2283 cells/uL (ref 850–3900)
MCH: 30.5 pg (ref 27.0–33.0)
MCHC: 34.3 g/dL (ref 32.0–36.0)
MCV: 88.9 fL (ref 80.0–100.0)
MPV: 9.6 fL (ref 7.5–12.5)
Monocytes Relative: 6.7 %
Neutro Abs: 2356 cells/uL (ref 1500–7800)
Neutrophils Relative %: 45.3 %
Platelets: 230 10*3/uL (ref 140–400)
RBC: 3.97 10*6/uL (ref 3.80–5.10)
RDW: 12.7 % (ref 11.0–15.0)
Total Lymphocyte: 43.9 %
WBC mixed population: 348 cells/uL (ref 200–950)
WBC: 5.2 10*3/uL (ref 3.8–10.8)

## 2017-10-02 LAB — HEPATITIS C ANTIBODY
Hepatitis C Ab: NONREACTIVE
SIGNAL TO CUT-OFF: 0.01 (ref <1.00)

## 2017-10-02 LAB — LIPID PANEL
Cholesterol: 180 mg/dL (ref <200)
HDL: 69 mg/dL (ref >50)
LDL Cholesterol (Calc): 92 mg/dL (calc)
Non-HDL Cholesterol (Calc): 111 mg/dL (calc) (ref <130)
Total CHOL/HDL Ratio: 2.6 (calc) (ref <5.0)
Triglycerides: 95 mg/dL (ref <150)

## 2017-10-02 LAB — HEMOGLOBIN A1C
Hgb A1c MFr Bld: 5.7 % of total Hgb — ABNORMAL HIGH (ref <5.7)
Mean Plasma Glucose: 117 (calc)
eAG (mmol/L): 6.5 (calc)

## 2017-10-04 ENCOUNTER — Ambulatory Visit: Payer: Medicare Other | Admitting: Internal Medicine

## 2017-10-04 ENCOUNTER — Encounter: Payer: Self-pay | Admitting: Internal Medicine

## 2017-10-04 VITALS — BP 110/70 | HR 73 | Temp 98.4°F | Wt 117.0 lb

## 2017-10-04 DIAGNOSIS — Z96611 Presence of right artificial shoulder joint: Secondary | ICD-10-CM

## 2017-10-04 DIAGNOSIS — E7800 Pure hypercholesterolemia, unspecified: Secondary | ICD-10-CM

## 2017-10-04 DIAGNOSIS — Z471 Aftercare following joint replacement surgery: Secondary | ICD-10-CM

## 2017-10-04 DIAGNOSIS — E2839 Other primary ovarian failure: Secondary | ICD-10-CM | POA: Diagnosis not present

## 2017-10-04 DIAGNOSIS — R739 Hyperglycemia, unspecified: Secondary | ICD-10-CM | POA: Diagnosis not present

## 2017-10-04 DIAGNOSIS — I10 Essential (primary) hypertension: Secondary | ICD-10-CM | POA: Diagnosis not present

## 2017-10-04 DIAGNOSIS — Z780 Asymptomatic menopausal state: Secondary | ICD-10-CM | POA: Insufficient documentation

## 2017-10-04 NOTE — Progress Notes (Signed)
[facility] Provider:  Fay Bagg L. Renato Gailseed, D.O., C.M.D.  Code Status: DNR Goals of Care:  Advanced Directives 08/06/2017  Does Patient Have a Medical Advance Directive? Yes  Type of Advance Directive Healthcare Power of Attorney  Does patient want to make changes to medical advance directive? -  Copy of Healthcare Power of Attorney in Chart? Yes   Chief Complaint  Patient presents with  . Medical Management of Chronic Issues    6mth follow-up, discuss labs    HPI: Patient is a 71 y.o. female seen today for medical management of chronic diseases.    Had her right shoulder surgery with Dr. Otelia SergeantNitka and is getting PT twice a week.  I don't have any notes from him about the surgical procedure (only preop clearance request).  Pain almost nonexistent.  Only took oxycodone for 1.5 days afterward--didn't want constipation.  Bowels doing fine.  Her husband had to be instructed to cook.  His back hurts when he stands at the stove.  He's had to feed the cat and the cat is confused.    Sugar average trended up b/c she has not been exercising.  Needs cushion gone on sling so she can go back to walking.    Cholesterol control stable and at goal.  [facility].  Had normal bone density and mammogram.    Past Medical History:  Diagnosis Date  . Disorder of bone and cartilage, unspecified   . 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 Diverticulitis of colon (without mention of hemorrhage)(562.11)   . Essential hypertension, benign   . Ganglion of tendon sheath   . Lumbago   . Other abnormal blood chemistry   . Other and unspecified hyperlipidemia   . Premenopausal menorrhagia   . Sebaceous cyst   . Shortness of breath     Past Surgical History:  Procedure Laterality Date  . ROTATOR CUFF REPAIR Left 10/06/13   tendon and ligament repair    Allergies  Allergen Reactions  . Iodine   . Penicillins   . Shellfish Allergy   .  Statins Other (See Comments)    High LFT's    Outpatient Encounter Medications as of 10/04/2017  Medication Sig  . aspirin 81 MG tablet Take 81 mg by mouth daily. Take 1 tablet daily.  . Biotin 1000 MCG tablet Take 1,000 mcg by mouth daily.  . calcium carbonate (OS-CAL) 600 MG TABS tablet Take 600 mg by mouth 2 (two) times daily with a meal. Take 1 tablet twice daily.  . cetirizine (ZYRTEC) 10 MG tablet Take 10 mg by mouth daily. Take 1 tablet once daily as needed for allergies.  . cholecalciferol (VITAMIN D) 400 UNITS TABS tablet Take 400 Units by mouth daily. Take 2 tablets once daily for bones.  . Glucosamine-Chondroit-Vit C-Mn (GLUCOSAMINE CHONDR 1500 COMPLX PO) Take by mouth daily.  Marland Kitchen. guaiFENesin (MUCINEX) 600 MG 12 hr tablet Take by mouth 2 (two) times daily.  Marland Kitchen. lisinopril-hydrochlorothiazide (PRINZIDE,ZESTORETIC) 10-12.5 MG tablet TAKE 1 TABLET DAILY  . Multiple Vitamins-Minerals (MULTIVITAMIN WITH MINERALS) tablet Take 1 tablet by mouth daily.  Marland Kitchen. PREMARIN vaginal cream   . Probiotic Product (PROBIOTIC DAILY PO) Take by mouth. Insync: 1 by mouth daily  . rosuvastatin (CRESTOR) 5 MG tablet TAKE ONE-HALF (1/2) TABLET AT BEDTIME ON MONDAY, WEDNESDAY, AND FRIDAY (AVOID GRAPEFRUIT PRODUCTS)  . vitamin B-12 (CYANOCOBALAMIN) 500 MCG tablet Take 500 mcg by mouth daily.  .Marland Kitchen  vitamin C (ASCORBIC ACID) 500 MG tablet Take 500 mg by mouth daily.   No facility-administered encounter medications on file as of 10/04/2017.     Review of Systems:  Review of Systems  Constitutional: Negative for chills, fever and malaise/fatigue.  HENT: Negative for congestion and hearing loss.   Eyes: Negative for blurred vision.  Respiratory: Negative for cough and shortness of breath.   Cardiovascular: Negative for chest pain, palpitations and leg swelling.  Gastrointestinal: Negative for abdominal pain, blood in stool, constipation and melena.  Genitourinary: Negative for dysuria.  Musculoskeletal: Positive for  joint pain. Negative for falls.       Wearing sling on right arm, minimal tenderness  Skin: Negative for itching and rash.  Neurological: Negative for dizziness, loss of consciousness and weakness.  Endo/Heme/Allergies: Does not bruise/bleed easily.  Psychiatric/Behavioral: Negative for depression and memory loss. The patient is not nervous/anxious and does not have insomnia.     Health Maintenance  Topic Date Due  . MAMMOGRAM  10/30/2017 (Originally 08/13/2016)  . TETANUS/TDAP  10/14/2017  . COLONOSCOPY  06/24/2019  . INFLUENZA VACCINE  Completed  . DEXA SCAN  Completed  . Hepatitis C Screening  Completed  . PNA vac Low Risk Adult  Completed    Physical Exam: Vitals:   10/04/17 1115  BP: 110/70  Pulse: 73  Temp: 98.4 F (36.9 C)  TempSrc: Oral  SpO2: 98%  Weight: 117 lb (53.1 kg)   Body mass index is 22.11 kg/m. Physical Exam  Constitutional: She is oriented to person, place, and time. She appears well-developed and well-nourished. No distress.  HENT:  Head: Normocephalic and atraumatic.  Cardiovascular: Normal rate, regular rhythm, normal heart sounds and intact distal pulses.  Pulmonary/Chest: Effort normal and breath sounds normal. No respiratory distress.  Abdominal: Soft. Bowel sounds are normal.  Musculoskeletal: Normal range of motion. She exhibits tenderness.  Right shoulder, using sling  Neurological: She is alert and oriented to person, place, and time.  Skin: Skin is warm and dry. Capillary refill takes less than 2 seconds.  Psychiatric: She has a normal mood and affect.    Labs reviewed: Basic Metabolic Panel: Recent Labs    02/08/17 1021 10/01/17 0936  NA 140 141  K 4.1 4.0  CL 102 103  CO2 31 28  GLUCOSE 95 98  BUN 17 13  CREATININE 0.86 0.77  CALCIUM 9.4 9.5   Liver Function Tests: Recent Labs    02/08/17 1021  AST 17  ALT 15  ALKPHOS 70  BILITOT 0.4  PROT 7.2  ALBUMIN 4.1   No results for input(s): LIPASE, AMYLASE in the last  8760 hours. No results for input(s): AMMONIA in the last 8760 hours. CBC: Recent Labs    02/08/17 1021 10/01/17 0936  WBC 5.2 5.2  NEUTROABS 2,600 2,356  HGB 13.4 12.1  HCT 39.9 35.3  MCV 90.5 88.9  PLT 235 230   Lipid Panel: Recent Labs    02/08/17 1021 10/01/17 0936  CHOL 173 180  HDL 66 69  LDLCALC 87  --   TRIG 99 95  CHOLHDL 2.6 2.6   Lab Results  Component Value Date   HGBA1C 5.7 (H) 10/01/2017    Assessment/Plan 1. Essential hypertension, benign - bp at goal, cont same regimen  - CBC with Differential/Platelet; Future - COMPLETE METABOLIC PANEL WITH GFR; Future  2. Pure hypercholesterolemia - lipids at goal with crestor, cont same and get back to diet and exercise program as soon as able -  Lipid panel; Future  3. Postmenopausal - DG Bone Density; Future  4. Estrogen deficiency - DG Bone Density; Future  5. 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 Otelia Sergeant  6. Hyperglycemia - has been prediabetic, trended up since surgery and immobility - Hemoglobin A1c; Future  Labs/tests ordered:   Orders Placed This Encounter  Procedures  . DG Bone Density    Standing Status:   Future    Standing Expiration Date:   12/05/2018    Scheduling Instructions:     In the new year    Order Specific Question:   Reason for Exam (SYMPTOM  OR DIAGNOSIS REQUIRED)    Answer:   postmenopausal, estrogen deficiency    Order Specific Question:   Preferred imaging location?    Answer:   [facility]  . Hemoglobin A1c    Standing Status:   Future    Standing Expiration Date:   10/04/2018  . Lipid panel    Standing Status:   Future    Standing Expiration Date:   10/04/2018  . CBC with Differential/Platelet    Standing Status:   Future    Standing Expiration Date:   10/04/2018  . COMPLETE METABOLIC PANEL WITH GFR    Standing Status:   Future    Standing Expiration Date:   10/04/2018    Next  appt:  6 mos med mgt, labs before  Broderick Fonseca L. Judit Awad, D.O. Geriatrics [facility] [address], [place] [address] Cell Phone (Mon-Fri 8am-5pm):  [phone] On Call:  [phone] & follow prompts after 5pm & weekends Office Phone:  [phone] Office Fax:  [phone]

## 2017-10-14 ENCOUNTER — Other Ambulatory Visit: Payer: Self-pay | Admitting: Internal Medicine

## 2017-11-03 ENCOUNTER — Other Ambulatory Visit: Payer: Self-pay | Admitting: Obstetrics & Gynecology

## 2018-01-26 ENCOUNTER — Other Ambulatory Visit: Payer: Self-pay | Admitting: Internal Medicine

## 2018-04-08 ENCOUNTER — Ambulatory Visit (INDEPENDENT_AMBULATORY_CARE_PROVIDER_SITE_OTHER): Payer: Medicare Other

## 2018-04-08 VITALS — BP 118/54 | HR 63 | Temp 98.5°F | Ht 61.0 in | Wt 116.0 lb

## 2018-04-08 DIAGNOSIS — I10 Essential (primary) hypertension: Secondary | ICD-10-CM

## 2018-04-08 DIAGNOSIS — E7800 Pure hypercholesterolemia, unspecified: Secondary | ICD-10-CM | POA: Diagnosis not present

## 2018-04-08 DIAGNOSIS — Z0000 Encounter for general adult medical examination without abnormal findings: Secondary | ICD-10-CM

## 2018-04-08 DIAGNOSIS — R739 Hyperglycemia, unspecified: Secondary | ICD-10-CM | POA: Diagnosis not present

## 2018-04-08 NOTE — Progress Notes (Signed)
Subjective:   Jonell Brumbaugh is a 72 y.o. female who presents for Medicare Annual (Subsequent) preventive examination.  Last AWV-03/21/2017       Objective:     Vitals: BP (!) 118/54 (BP Location: Left Arm, Patient Position: Sitting)   Pulse 63   Temp 98.5 F (36.9 C) (Oral)   Ht 5\' 1"  (1.549 m)   Wt 116 lb (52.6 kg)   SpO2 98%   BMI 21.92 kg/m   Body mass index is 21.92 kg/m.  Advanced Directives 04/08/2018 08/06/2017 03/30/2017 03/21/2017 08/25/2016 08/10/2016 02/10/2016  Does Patient Have a Medical Advance Directive? Yes Yes Yes Yes Yes Yes Yes  Type of Estate agent of [place];Living will Healthcare Power of [employer] of [place];Living will Healthcare Power of [place];Living will - - -  Does patient want to make changes to medical advance directive? No - Patient declined - - No - Patient declined - - -  Copy of Healthcare Power of Attorney in Chart? Yes Yes Yes No - copy requested - No - copy requested No - copy requested    Tobacco Social History   Tobacco Use  Smoking Status Former Smoker  . Packs/day: 1.00  . Years: 5.00  . Pack years: 5.00  . Start date: 10/30/1969  Smokeless Tobacco Never Used     Counseling given: Not Answered   Clinical Intake:  Pre-visit preparation completed: No  Pain : No/denies pain     Nutritional Risks: None Diabetes: No  How often do you need to have someone help you when you read instructions, pamphlets, or other written materials from your doctor or pharmacy?: 1 - Never What is the last grade level you completed in school?: Bachelors  Interpreter Needed?: No  Information entered by :: Tyron Russell, RN  Past Medical History:  Diagnosis Date  . Disorder of bone and cartilage, unspecified   . 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 Diverticulitis of colon (without mention of hemorrhage)(562.11)   . Essential hypertension, benign   . Ganglion of tendon  sheath   . Lumbago   . Other abnormal blood chemistry   . Other and unspecified hyperlipidemia   . Premenopausal menorrhagia   . Sebaceous cyst   . Shortness of breath    Past Surgical History:  Procedure Laterality Date  . ROTATOR CUFF REPAIR Left 10/06/13   tendon and ligament repair   Family History  Problem Relation Age of Onset  . Lung cancer Mother   . Osteoporosis Mother   . Coronary artery disease Father    Social History   Socioeconomic History  . Marital status: Married    Spouse name: Not on file  . Number of children: Not on file  . Years of education: Not on file  . Highest education level: Not on file  Occupational History  . Not on file  Social Needs  . Financial resource strain: Not hard at all  . Food insecurity:    Worry: Never true    Inability: Never true  . Transportation needs:    Medical: No    Non-medical: No  Tobacco Use  . Smoking status: Former Smoker    Packs/day: 1.00    Years: 5.00    Pack years: 5.00    Start date: 10/30/1969  . Smokeless tobacco: Never Used  Substance and Sexual Activity  . Alcohol use: No    Alcohol/week: 0.0 oz  . Drug use: No  . Sexual  activity: Not on file  Lifestyle  . Physical activity:    Days per week: 3 days    Minutes per session: 40 min  . Stress: Not at all  Relationships  . Social connections:    Talks on phone: More than three times a week    Gets together: More than three times a week    Attends religious service: More than 4 times per year    Active member of club or organization: No    Attends meetings of clubs or organizations: Never    Relationship status: Married  Other Topics Concern  . Not on file  Social History Narrative   Married 23 years, with Jomarie Longs for 29 years    Outpatient Encounter Medications as of 04/08/2018  Medication Sig  . aspirin 81 MG tablet Take 81 mg by mouth daily. Take 1 tablet daily.  . Biotin 1000 MCG tablet Take 1,000 mcg by mouth daily.  . calcium  carbonate (OS-CAL) 600 MG TABS tablet Take 600 mg by mouth 2 (two) times daily with a meal. Take 1 tablet twice daily.  . cetirizine (ZYRTEC) 10 MG tablet Take 10 mg by mouth daily. Take 1 tablet once daily as needed for allergies.  . cholecalciferol (VITAMIN D) 400 UNITS TABS tablet Take 400 Units by mouth daily. Take 2 tablets once daily for bones.  . Glucosamine-Chondroit-Vit C-Mn (GLUCOSAMINE CHONDR 1500 COMPLX PO) Take by mouth daily.  Marland Kitchen guaiFENesin (MUCINEX) 600 MG 12 hr tablet Take by mouth 2 (two) times daily.  Marland Kitchen lisinopril-hydrochlorothiazide (PRINZIDE,ZESTORETIC) 10-12.5 MG tablet TAKE 1 TABLET DAILY  . Multiple Vitamins-Minerals (MULTIVITAMIN WITH MINERALS) tablet Take 1 tablet by mouth daily.  . Probiotic Product (PROBIOTIC DAILY PO) Take by mouth. Insync: 1 by mouth daily  . rosuvastatin (CRESTOR) 5 MG tablet TAKE ONE-HALF (1/2) TABLET AT BEDTIME ON MONDAY, WEDNESDAY, AND FRIDAY (AVOID GRAPEFRUIT PRODUCTS)  . vitamin B-12 (CYANOCOBALAMIN) 500 MCG tablet Take 500 mcg by mouth daily.  . vitamin C (ASCORBIC ACID) 500 MG tablet Take 500 mg by mouth daily.  Marland Kitchen PREMARIN vaginal cream    No facility-administered encounter medications on file as of 04/08/2018.     Activities of Daily Living In your present state of health, do you have any difficulty performing the following activities: 04/08/2018  Hearing? N  Vision? N  Difficulty concentrating or making decisions? N  Walking or climbing stairs? N  Dressing or bathing? N  Doing errands, shopping? N  Preparing Food and eating ? N  Using the Toilet? N  In the past six months, have you accidently leaked urine? N  Do you have problems with loss of bowel control? N  Managing your Medications? N  Managing your Finances? N  Housekeeping or managing your Housekeeping? N  Some recent data might be hidden    Patient Care Team: Kermit Balo, DO as PCP - General (Geriatric Medicine) Beverely Low, MD as Consulting Physician (Orthopedic  Surgery) Mateo Flow, MD as Consulting Physician (Ophthalmology)    Assessment:   This is a routine wellness examination for Lenay.  Exercise Activities and Dietary recommendations Current Exercise Habits: Structured exercise class, Type of exercise: strength training/weights;walking, Time (Minutes): 30, Frequency (Times/Week): 3, Weekly Exercise (Minutes/Week): 90, Intensity: Mild, Exercise limited by: None identified  Goals    None      Fall Risk Fall Risk  04/08/2018 10/04/2017 08/06/2017 03/21/2017 02/10/2016  Falls in the past year? Yes No Yes Yes No  Number falls in past yr: 1 -  1 1 -  Injury with Fall? Yes - No Yes -  Comment R shoulder - - R shoulder pain -   Is the patient's home free of loose throw rugs in walkways, pet beds, electrical cords, etc?   yes      Grab bars in the bathroom? no      Handrails on the stairs?   yes      Adequate lighting?   yes  Timed Get Up and Go performed: 13 seconds  Depression Screen PHQ 2/9 Scores 04/08/2018 10/04/2017 03/21/2017 02/10/2016  PHQ - 2 Score 0 0 0 0     Cognitive Function MMSE - Mini Mental State Exam 04/08/2018 03/21/2017 02/10/2016 07/13/2014  Orientation to time 5 5 5 5   Orientation to Place 5 5 5 5   Registration 3 3 3 3   Attention/ Calculation 5 5 5 5   Recall 3 3 3 2   Language- name 2 objects 2 2 2 2   Language- repeat 1 1 1 1   Language- follow 3 step command 3 3 3 3   Language- read & follow direction 1 1 1 1   Write a sentence 1 1 1 1   Copy design 1 1 1 1   Total score 30 30 30 29         Immunization History  Administered Date(s) Administered  . Influenza Whole 07/28/2010, 07/11/2012  . Influenza, High Dose Seasonal PF 07/12/2017  . Influenza,inj,Quad PF,6+ Mos 07/13/2014, 08/12/2015, 06/15/2016  . Influenza-Unspecified 07/30/2013  . Pneumococcal Conjugate-13 10/08/2014, 10/09/2014  . Pneumococcal Polysaccharide-23 12/06/2010  . Tdap 10/15/2007, 11/07/2017  . Zoster 01/25/2013  . Zoster Recombinat  (Shingrix) 09/26/2017, 11/30/2017    Qualifies for Shingles Vaccine? Up to date  Screening Tests Health Maintenance  Topic Date Due  . MAMMOGRAM  08/13/2016  . INFLUENZA VACCINE  05/30/2018  . COLONOSCOPY  06/24/2019  . TETANUS/TDAP  11/08/2027  . DEXA SCAN  Completed  . Hepatitis C Screening  Completed  . PNA vac Low Risk Adult  Completed    Cancer Screenings: Lung: Low Dose CT Chest recommended if Age 11-80 years, 30 pack-year currently smoking OR have quit w/in 15years. Patient does not qualify. Breast:  Up to date on Mammogram? Yes   Up to date of Bone Density/Dexa? Yes Colorectal: up to date  Additional Screenings:  Hepatitis C Screening: declined     Plan:    I have personally reviewed and addressed the Medicare Annual Wellness questionnaire and have noted the following in the patient's chart:  A. Medical and social history B. Use of alcohol, tobacco or illicit drugs  C. Current medications and supplements D. Functional ability and status E.  Nutritional status F.  Physical activity G. Advance directives H. List of other physicians I.  Hospitalizations, surgeries, and ER visits in previous 12 months J.  Vitals K. Screenings to include hearing, vision, cognitive, depression L. Referrals and appointments - none  In addition, I have reviewed and discussed with patient certain preventive protocols, quality metrics, and best practice recommendations. A written personalized care plan for preventive services as well as general preventive health recommendations were provided to patient.  See attached scanned questionnaire for additional information.   Signed,   Tyron RussellSara Eulonda Andalon, RN Nurse Health Advisor  Patient Concerns:Intermittent R knee burning pain

## 2018-04-08 NOTE — Patient Instructions (Signed)
[name] , Thank you for taking time to come for your Medicare Wellness Visit. I appreciate your ongoing commitment to your health goals. Please review the following plan we discussed and let me know if I can assist you in the future.   Screening recommendations/referrals: Colonoscopy up to date, due 01/07/2023 Mammogram up to date, due 06/2018 Bone Density up to date Recommended yearly ophthalmology/optometry visit for glaucoma screening and checkup Recommended yearly dental visit for hygiene and checkup  Vaccinations: Influenza vaccine up to date, due 2019 fall season Pneumococcal vaccine up to date, due 2019 fall season Tdap vaccine up to date, due 11/08/2027 Shingles vaccine up to date, completed    Advanced directives: In chart  Conditions/risks identified: none  Next appointment: Dr. Renato Gailseed 04/11/2018 @ 11am             Tamara RussellSara Saunders, RN 04/11/2019 @ 10am   Preventive Care 65 Years and Older, Female Preventive care refers to lifestyle choices and visits with your health care provider that can promote health and wellness. What does preventive care include?  A yearly physical exam. This is also called an annual well check.  Dental exams once or twice a year.  Routine eye exams. Ask your health care provider how often you should have your eyes checked.  Personal lifestyle choices, including:  Daily care of your teeth and gums.  Regular physical activity.  Eating a healthy diet.  Avoiding tobacco and drug use.  Limiting alcohol use.  Practicing safe sex.  Taking low-dose aspirin every day.  Taking vitamin and mineral supplements as recommended by your health care provider. What happens during an annual well check? The services and screenings done by your health care provider during your annual well check will depend on your age, overall health, lifestyle risk factors, and family history of disease. Counseling  Your health care provider may ask you questions about  your:  Alcohol use.  Tobacco use.  Drug use.  Emotional well-being.  Home and relationship well-being.  Sexual activity.  Eating habits.  History of falls.  Memory and ability to understand (cognition).  Work and work Astronomerenvironment.  Reproductive health. Screening  You may have the following tests or measurements:  Height, weight, and BMI.  Blood pressure.  Lipid and cholesterol levels. These may be checked every 5 years, or more frequently if you are over 72 years old.  Skin check.  Lung cancer screening. You may have this screening every year starting at age 72 if you have a 30-pack-year history of smoking and currently smoke or have quit within the past 15 years.  Fecal occult blood test (FOBT) of the stool. You may have this test every year starting at age 72.  Flexible sigmoidoscopy or colonoscopy. You may have a sigmoidoscopy every 5 years or a colonoscopy every 10 years starting at age 72.  Hepatitis C blood test.  Hepatitis B blood test.  Sexually transmitted disease (STD) testing.  Diabetes screening. This is done by checking your blood sugar (glucose) after you have not eaten for a while (fasting). You may have this done every 1-3 years.  Bone density scan. This is done to screen for osteoporosis. You may have this done starting at age 72.  Mammogram. This may be done every 1-2 years. Talk to your health care provider about how often you should have regular mammograms. Talk with your health care provider about your test results, treatment options, and if necessary, the need for more tests. Vaccines  Your  health care provider may recommend certain vaccines, such as:  Influenza vaccine. This is recommended every year.  Tetanus, diphtheria, and acellular pertussis (Tdap, Td) vaccine. You may need a Td booster every 10 years.  Zoster vaccine. You may need this after age 75.  Pneumococcal 13-valent conjugate (PCV13) vaccine. One dose is recommended  after age 19.  Pneumococcal polysaccharide (PPSV23) vaccine. One dose is recommended after age 74. Talk to your health care provider about which screenings and vaccines you need and how often you need them. This information is not intended to replace advice given to you by your health care provider. Make sure you discuss any questions you have with your health care provider. Document Released: 11/12/2015 Document Revised: 07/05/2016 Document Reviewed: 08/17/2015 Elsevier Interactive Patient Education  2017 [employer].  Fall Prevention in the Home Falls can cause injuries. They can happen to people of all ages. There are many things you can do to make your home safe and to help prevent falls. What can I do on the outside of my home?  Regularly fix the edges of walkways and driveways and fix any cracks.  Remove anything that might make you trip as you walk through a door, such as a raised step or threshold.  Trim any bushes or trees on the path to your home.  Use bright outdoor lighting.  Clear any walking paths of anything that might make someone trip, such as rocks or tools.  Regularly check to see if handrails are loose or broken. Make sure that both sides of any steps have handrails.  Any raised decks and porches should have guardrails on the edges.  Have any leaves, snow, or ice cleared regularly.  Use sand or salt on walking paths during winter.  Clean up any spills in your garage right away. This includes oil or grease spills. What can I do in the bathroom?  Use night lights.  Install grab bars by the toilet and in the tub and shower. Do not use towel bars as grab bars.  Use non-skid mats or decals in the tub or shower.  If you need to sit down in the shower, use a plastic, non-slip stool.  Keep the floor dry. Clean up any water that spills on the floor as soon as it happens.  Remove soap buildup in the tub or shower regularly.  Attach bath mats securely with  double-sided non-slip rug tape.  Do not have throw rugs and other things on the floor that can make you trip. What can I do in the bedroom?  Use night lights.  Make sure that you have a light by your bed that is easy to reach.  Do not use any sheets or blankets that are too big for your bed. They should not hang down onto the floor.  Have a firm chair that has side arms. You can use this for support while you get dressed.  Do not have throw rugs and other things on the floor that can make you trip. What can I do in the kitchen?  Clean up any spills right away.  Avoid walking on wet floors.  Keep items that you use a lot in easy-to-reach places.  If you need to reach something above you, use a strong step stool that has a grab bar.  Keep electrical cords out of the way.  Do not use floor polish or wax that makes floors slippery. If you must use wax, use non-skid floor wax.  Do not  have throw rugs and other things on the floor that can make you trip. What can I do with my stairs?  Do not leave any items on the stairs.  Make sure that there are handrails on both sides of the stairs and use them. Fix handrails that are broken or loose. Make sure that handrails are as long as the stairways.  Check any carpeting to make sure that it is firmly attached to the stairs. Fix any carpet that is loose or worn.  Avoid having throw rugs at the top or bottom of the stairs. If you do have throw rugs, attach them to the floor with carpet tape.  Make sure that you have a light switch at the top of the stairs and the bottom of the stairs. If you do not have them, ask someone to add them for you. What else can I do to help prevent falls?  Wear shoes that:  Do not have high heels.  Have rubber bottoms.  Are comfortable and fit you well.  Are closed at the toe. Do not wear sandals.  If you use a stepladder:  Make sure that it is fully opened. Do not climb a closed stepladder.  Make  sure that both sides of the stepladder are locked into place.  Ask someone to hold it for you, if possible.  Clearly mark and make sure that you can see:  Any grab bars or handrails.  First and last steps.  Where the edge of each step is.  Use tools that help you move around (mobility aids) if they are needed. These include:  Canes.  Walkers.  Scooters.  Crutches.  Turn on the lights when you go into a dark area. Replace any light bulbs as soon as they burn out.  Set up your furniture so you have a clear path. Avoid moving your furniture around.  If any of your floors are uneven, fix them.  If there are any pets around you, be aware of where they are.  Review your medicines with your doctor. Some medicines can make you feel dizzy. This can increase your chance of falling. Ask your doctor what other things that you can do to help prevent falls. This information is not intended to replace advice given to you by your health care provider. Make sure you discuss any questions you have with your health care provider. Document Released: 08/12/2009 Document Revised: 03/23/2016 Document Reviewed: 11/20/2014 Elsevier Interactive Patient Education  2017 [employer].

## 2018-04-09 ENCOUNTER — Encounter: Payer: Self-pay | Admitting: Internal Medicine

## 2018-04-09 ENCOUNTER — Other Ambulatory Visit: Payer: Medicare Other

## 2018-04-09 LAB — HEMOGLOBIN A1C
Hgb A1c MFr Bld: 5.7 % of total Hgb — ABNORMAL HIGH (ref <5.7)
Mean Plasma Glucose: 117 (calc)
eAG (mmol/L): 6.5 (calc)

## 2018-04-09 LAB — CBC WITH DIFFERENTIAL/PLATELET
Basophils Absolute: 18 cells/uL (ref 0–200)
Basophils Relative: 0.3 %
Eosinophils Absolute: 180 cells/uL (ref 15–500)
Eosinophils Relative: 3 %
HCT: 37.6 % (ref 35.0–45.0)
Hemoglobin: 13.1 g/dL (ref 11.7–15.5)
Lymphs Abs: 3624 cells/uL (ref 850–3900)
MCH: 30.5 pg (ref 27.0–33.0)
MCHC: 34.8 g/dL (ref 32.0–36.0)
MCV: 87.4 fL (ref 80.0–100.0)
MPV: 9.9 fL (ref 7.5–12.5)
Monocytes Relative: 6.2 %
Neutro Abs: 1806 cells/uL (ref 1500–7800)
Neutrophils Relative %: 30.1 %
Platelets: 223 10*3/uL (ref 140–400)
RBC: 4.3 10*6/uL (ref 3.80–5.10)
RDW: 13 % (ref 11.0–15.0)
Total Lymphocyte: 60.4 %
WBC mixed population: 372 cells/uL (ref 200–950)
WBC: 6 10*3/uL (ref 3.8–10.8)

## 2018-04-09 LAB — COMPLETE METABOLIC PANEL WITH GFR
AG Ratio: 1.4 (calc) (ref 1.0–2.5)
ALT: 13 U/L (ref 6–29)
AST: 16 U/L (ref 10–35)
Albumin: 4.1 g/dL (ref 3.6–5.1)
Alkaline phosphatase (APISO): 77 U/L (ref 33–130)
BUN: 17 mg/dL (ref 7–25)
CO2: 31 mmol/L (ref 20–32)
Calcium: 9.6 mg/dL (ref 8.6–10.4)
Chloride: 103 mmol/L (ref 98–110)
Creat: 0.78 mg/dL (ref 0.60–0.93)
GFR, Est African American: 88 mL/min/{1.73_m2} (ref >=60)
GFR, Est Non African American: 76 mL/min/{1.73_m2} (ref >=60)
Globulin: 2.9 g/dL (calc) (ref 1.9–3.7)
Glucose, Bld: 94 mg/dL (ref 65–99)
Potassium: 4.3 mmol/L (ref 3.5–5.3)
Sodium: 140 mmol/L (ref 135–146)
Total Bilirubin: 0.4 mg/dL (ref 0.2–1.2)
Total Protein: 7 g/dL (ref 6.1–8.1)

## 2018-04-09 LAB — LIPID PANEL
Cholesterol: 187 mg/dL (ref <200)
HDL: 58 mg/dL (ref >50)
LDL Cholesterol (Calc): 110 mg/dL (calc) — ABNORMAL HIGH
Non-HDL Cholesterol (Calc): 129 mg/dL (calc) (ref <130)
Total CHOL/HDL Ratio: 3.2 (calc) (ref <5.0)
Triglycerides: 96 mg/dL (ref <150)

## 2018-04-11 ENCOUNTER — Encounter: Payer: Self-pay | Admitting: Internal Medicine

## 2018-04-11 ENCOUNTER — Ambulatory Visit: Payer: Medicare Other | Admitting: Internal Medicine

## 2018-04-11 ENCOUNTER — Other Ambulatory Visit: Payer: Self-pay | Admitting: Internal Medicine

## 2018-04-11 VITALS — BP 118/62 | HR 68 | Temp 98.6°F | Ht 61.0 in | Wt 116.0 lb

## 2018-04-11 DIAGNOSIS — E7800 Pure hypercholesterolemia, unspecified: Secondary | ICD-10-CM

## 2018-04-11 DIAGNOSIS — Z780 Asymptomatic menopausal state: Secondary | ICD-10-CM

## 2018-04-11 DIAGNOSIS — I10 Essential (primary) hypertension: Secondary | ICD-10-CM | POA: Diagnosis not present

## 2018-04-11 DIAGNOSIS — Z1159 Encounter for screening for other viral diseases: Secondary | ICD-10-CM

## 2018-04-11 DIAGNOSIS — R739 Hyperglycemia, unspecified: Secondary | ICD-10-CM

## 2018-04-11 DIAGNOSIS — M25561 Pain in right knee: Secondary | ICD-10-CM | POA: Diagnosis not present

## 2018-04-11 NOTE — Progress Notes (Signed)
Location:  [facility] clinic Provider:  Rosamaria Donn L. Renato Gails, D.O., C.M.D.  Code Status: DNR Goals of Care:  Advanced Directives 04/11/2018  Does Patient Have a Medical Advance Directive? Yes  Type of Estate agent of [place];Living will  Does patient want to make changes to medical advance directive? No - Patient declined  Copy of Healthcare Power of Attorney in Chart? Yes     Chief Complaint  Patient presents with  . Medical Management of Chronic Issues    follow-up    HPI: Patient is a 72 y.o. female seen today for medical management of chronic diseases.    She was getting a burning sensation under her knee cap and it felt warm (right).  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  Hasn't noticed a correlation with doing anything in particular.  Felt it after getting up to the restroom at night.  It's actually happening less and less often.   She can tell rain is coming and that's always been the case.  [facility] and is able to do all of her exercises.  Seems random when it decides to flare up.  In the beginning when it was more bothersome, she used aleve a couple of times.  Also put ice on it a few times.    LDL went up to 110. She'd been exercising less until 6 wks ago and she hasn't been to the more intense exercise class.  She was also eating ice cream.    hba1c 5.7 which is same as Dec '18.    Past Medical History:  Diagnosis Date  . Disorder of bone and cartilage, unspecified   . 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 Diverticulitis of colon (without mention of hemorrhage)(562.11)   . Essential hypertension, benign   . Ganglion of tendon sheath   . Lumbago   . Other abnormal blood chemistry   . Other and unspecified hyperlipidemia   . Premenopausal menorrhagia   . Sebaceous cyst   . Shortness of breath     Past Surgical History:  Procedure Laterality Date  . ROTATOR CUFF REPAIR Left 10/06/13    tendon and ligament repair    Allergies  Allergen Reactions  . Iodine   . Penicillins   . Shellfish Allergy   . Statins Other (See Comments)    High LFT's    Outpatient Encounter Medications as of 04/11/2018  Medication Sig  . aspirin 81 MG tablet Take 81 mg by mouth daily. Take 1 tablet daily.  . Biotin 1000 MCG tablet Take 1,000 mcg by mouth daily.  . calcium carbonate (OS-CAL) 600 MG TABS tablet Take 600 mg by mouth 2 (two) times daily with a meal. Take 1 tablet twice daily.  . cetirizine (ZYRTEC) 10 MG tablet Take 10 mg by mouth daily. Take 1 tablet once daily as needed for allergies.  . cholecalciferol (VITAMIN D) 400 UNITS TABS tablet Take 400 Units by mouth daily. Take 2 tablets once daily for bones.  . Glucosamine-Chondroit-Vit C-Mn (GLUCOSAMINE CHONDR 1500 COMPLX PO) Take by mouth daily.  Marland Kitchen guaiFENesin (MUCINEX) 600 MG 12 hr tablet Take by mouth 2 (two) times daily.  Marland Kitchen lisinopril-hydrochlorothiazide (PRINZIDE,ZESTORETIC) 10-12.5 MG tablet TAKE 1 TABLET DAILY  . Multiple Vitamins-Minerals (MULTIVITAMIN WITH MINERALS) tablet Take 1 tablet by mouth daily.  Marland Kitchen PREMARIN vaginal cream   . Probiotic Product (PROBIOTIC DAILY PO) Take by mouth. Insync: 1 by  mouth daily  . rosuvastatin (CRESTOR) 5 MG tablet TAKE ONE-HALF (1/2) TABLET AT BEDTIME ON MONDAY, WEDNESDAY, AND FRIDAY (AVOID GRAPEFRUIT PRODUCTS)  . vitamin B-12 (CYANOCOBALAMIN) 500 MCG tablet Take 500 mcg by mouth daily.  . vitamin C (ASCORBIC ACID) 500 MG tablet Take 500 mg by mouth daily.   No facility-administered encounter medications on file as of 04/11/2018.     Review of Systems:  Review of Systems  Constitutional: Negative for chills, fever and malaise/fatigue.  HENT: Negative for congestion and hearing loss.   Eyes: Negative for blurred vision.  Respiratory: Negative for shortness of breath.   Cardiovascular: Negative for chest pain, palpitations and leg swelling.  Gastrointestinal: Negative for abdominal pain,  blood in stool, constipation, heartburn and melena.  Genitourinary: Negative for dysuria.  Musculoskeletal: Positive for joint pain. Negative for falls.       Right knee burning  Skin: Negative for itching and rash.  Neurological: Negative for dizziness and loss of consciousness.  Endo/Heme/Allergies: Does not bruise/bleed easily.  Psychiatric/Behavioral: Negative for depression and memory loss. The patient is not nervous/anxious and does not have insomnia.     Health Maintenance  Topic Date Due  . INFLUENZA VACCINE  05/30/2018  . COLONOSCOPY  06/24/2019  . MAMMOGRAM  08/23/2019  . TETANUS/TDAP  11/08/2027  . DEXA SCAN  Completed  . Hepatitis C Screening  Completed  . PNA vac Low Risk Adult  Completed    Physical Exam: Vitals:   04/11/18 1440  BP: 118/62  Pulse: 68  Temp: 98.6 F (37 C)  TempSrc: Oral  SpO2: 94%  Weight: 116 lb (52.6 kg)  Height: 5\' 1"  (1.549 m)   Body mass index is 21.92 kg/m. Physical Exam  Constitutional: She is oriented to person, place, and time. She appears well-developed and well-nourished. No distress.  HENT:  Head: Normocephalic and atraumatic.  Cardiovascular: Normal rate, regular rhythm, normal heart sounds and intact distal pulses.  Pulmonary/Chest: Effort normal and breath sounds normal. No respiratory distress.  Abdominal: Bowel sounds are normal.  Musculoskeletal: Normal range of motion. She exhibits no edema, tenderness or deformity.  Slight warmth right vs left knee, but no other findings  Neurological: She is alert and oriented to person, place, and time.  Skin: Skin is warm and dry.  Psychiatric: She has a normal mood and affect.    Labs reviewed: Basic Metabolic Panel: Recent Labs    10/01/17 0936 04/08/18 0840  NA 141 140  K 4.0 4.3  CL 103 103  CO2 28 31  GLUCOSE 98 94  BUN 13 17  CREATININE 0.77 0.78  CALCIUM 9.5 9.6   Liver Function Tests: Recent Labs    04/08/18 0840  AST 16  ALT 13  BILITOT 0.4  PROT 7.0    No results for input(s): LIPASE, AMYLASE in the last 8760 hours. No results for input(s): AMMONIA in the last 8760 hours. CBC: Recent Labs    10/01/17 0936 04/08/18 0840  WBC 5.2 6.0  NEUTROABS 2,356 1,806  HGB 12.1 13.1  HCT 35.3 37.6  MCV 88.9 87.4  PLT 230 223   Lipid Panel: Recent Labs    10/01/17 0936 04/08/18 0840  CHOL 180 187  HDL 69 58  LDLCALC 92 110*  TRIG 95 96  CHOLHDL 2.6 3.2   Lab Results  Component Value Date   HGBA1C 5.7 (H) 04/08/2018   Assessment/Plan 1. Knee pain, right anterior -suspect OA vs pseudogout -discussed that imaging may or may not confirm this -  pt is not bothered enough by pain to pursue imaging and I did not think it was necessary at this time -discussed tylenol if more bothersome, but continued exercise program -if pain worsens or becomes persistent, interferes with activity or effusion develops, she'll be back to see me  2. Essential hypertension, benign -bp well controlled, cont same meds - CBC with Differential/Platelet; Future - COMPLETE METABOLIC PANEL WITH GFR; Future  3. Pure hypercholesterolemia -lipids trended up, educated on dietary changes and exercise-=-she's getting back to her exercise classes  - Lipid panel; Future  4. Hyperglycemia -hba1c also had trended up but she's getting back on target with her exercise and had been eating ice cream - Hemoglobin A1c; Future  5. Need for hepatitis C screening test -she decided she wants to be tested now "even if I have to pay for it" so will add to future labs before CPE - Hepatitis C Antibody; Future  6. Postmenopausal - due for f/u bone density so will order: - DG Bone Density; Future  Labs/tests ordered:   Orders Placed This Encounter  Procedures  . DG Bone Density    Standing Status:   Future    Standing Expiration Date:   06/12/2019    Order Specific Question:   Reason for Exam (SYMPTOM  OR DIAGNOSIS REQUIRED)    Answer:   postmenopausal, estrogen  deficiency, family history of osteoporosis in sister and mother    Order Specific Question:   Preferred imaging location?    Answer:   External    Comments:   [facility]  . Hemoglobin A1c    Standing Status:   Future    Standing Expiration Date:   04/12/2019  . Lipid panel    Standing Status:   Future    Standing Expiration Date:   04/12/2019  . CBC with Differential/Platelet    Standing Status:   Future    Standing Expiration Date:   04/12/2019  . COMPLETE METABOLIC PANEL WITH GFR    Standing Status:   Future    Standing Expiration Date:   04/12/2019  . Hepatitis C Antibody    Standing Status:   Future    Standing Expiration Date:   04/12/2019   Next appt:  6 mos for CPE, labs before  Kassie Keng L. Datha Kissinger, D.O. Geriatrics [facility] [address] [place], [place] [address] Cell Phone (Mon-Fri 8am-5pm):  [phone] On Call:  [phone] & follow prompts after 5pm & weekends Office Phone:  [phone] Office Fax:  [phone]

## 2018-05-23 ENCOUNTER — Telehealth: Payer: Self-pay

## 2018-05-23 NOTE — Telephone Encounter (Signed)
Patient was in office yesterday accompanying her husband to an appointment and made an inquiry about bone density order placed 04/11/18.  Patient states she has not heard anything from [facility]  I informed patient that I will fax order to [facility] @ [phone] requesting appointment.  [facility] for Bone Density was received and patient will be contacted  S.Chrae B/CMA

## 2018-06-06 ENCOUNTER — Encounter: Payer: Self-pay | Admitting: Internal Medicine

## 2018-06-06 LAB — HM DEXA SCAN: HM Dexa Scan: NORMAL

## 2018-07-22 ENCOUNTER — Ambulatory Visit (INDEPENDENT_AMBULATORY_CARE_PROVIDER_SITE_OTHER): Payer: Medicare Other | Admitting: *Deleted

## 2018-07-22 DIAGNOSIS — Z23 Encounter for immunization: Secondary | ICD-10-CM | POA: Diagnosis not present

## 2018-09-11 LAB — HM MAMMOGRAPHY

## 2018-09-12 ENCOUNTER — Encounter: Payer: Self-pay | Admitting: *Deleted

## 2018-10-06 ENCOUNTER — Other Ambulatory Visit: Payer: Self-pay | Admitting: Internal Medicine

## 2018-10-10 ENCOUNTER — Other Ambulatory Visit: Payer: Medicare Other

## 2018-10-10 DIAGNOSIS — I10 Essential (primary) hypertension: Secondary | ICD-10-CM

## 2018-10-10 DIAGNOSIS — E7800 Pure hypercholesterolemia, unspecified: Secondary | ICD-10-CM

## 2018-10-10 DIAGNOSIS — R739 Hyperglycemia, unspecified: Secondary | ICD-10-CM

## 2018-10-10 DIAGNOSIS — Z1159 Encounter for screening for other viral diseases: Secondary | ICD-10-CM

## 2018-10-11 LAB — COMPLETE METABOLIC PANEL WITH GFR
AG Ratio: 1.3 (calc) (ref 1.0–2.5)
ALT: 12 U/L (ref 6–29)
AST: 16 U/L (ref 10–35)
Albumin: 4.2 g/dL (ref 3.6–5.1)
Alkaline phosphatase (APISO): 75 U/L (ref 33–130)
BUN: 20 mg/dL (ref 7–25)
CO2: 30 mmol/L (ref 20–32)
Calcium: 9.7 mg/dL (ref 8.6–10.4)
Chloride: 103 mmol/L (ref 98–110)
Creat: 0.83 mg/dL (ref 0.60–0.93)
GFR, Est African American: 82 mL/min/{1.73_m2} (ref >=60)
GFR, Est Non African American: 70 mL/min/{1.73_m2} (ref >=60)
Globulin: 3.2 g/dL (calc) (ref 1.9–3.7)
Glucose, Bld: 91 mg/dL (ref 65–99)
Potassium: 4 mmol/L (ref 3.5–5.3)
Sodium: 141 mmol/L (ref 135–146)
Total Bilirubin: 0.4 mg/dL (ref 0.2–1.2)
Total Protein: 7.4 g/dL (ref 6.1–8.1)

## 2018-10-11 LAB — CBC WITH DIFFERENTIAL/PLATELET
Basophils Absolute: 19 cells/uL (ref 0–200)
Basophils Relative: 0.3 %
Eosinophils Absolute: 164 cells/uL (ref 15–500)
Eosinophils Relative: 2.6 %
HCT: 39 % (ref 35.0–45.0)
Hemoglobin: 13.1 g/dL (ref 11.7–15.5)
Lymphs Abs: 3182 cells/uL (ref 850–3900)
MCH: 30.2 pg (ref 27.0–33.0)
MCHC: 33.6 g/dL (ref 32.0–36.0)
MCV: 89.9 fL (ref 80.0–100.0)
MPV: 9.9 fL (ref 7.5–12.5)
Monocytes Relative: 6.5 %
Neutro Abs: 2526 cells/uL (ref 1500–7800)
Neutrophils Relative %: 40.1 %
Platelets: 240 10*3/uL (ref 140–400)
RBC: 4.34 10*6/uL (ref 3.80–5.10)
RDW: 12.5 % (ref 11.0–15.0)
Total Lymphocyte: 50.5 %
WBC mixed population: 410 cells/uL (ref 200–950)
WBC: 6.3 10*3/uL (ref 3.8–10.8)

## 2018-10-11 LAB — HEMOGLOBIN A1C
Hgb A1c MFr Bld: 5.8 % of total Hgb — ABNORMAL HIGH (ref <5.7)
Mean Plasma Glucose: 120 (calc)
eAG (mmol/L): 6.6 (calc)

## 2018-10-11 LAB — LIPID PANEL
Cholesterol: 190 mg/dL (ref <200)
HDL: 67 mg/dL (ref >50)
LDL Cholesterol (Calc): 104 mg/dL (calc) — ABNORMAL HIGH
Non-HDL Cholesterol (Calc): 123 mg/dL (calc) (ref <130)
Total CHOL/HDL Ratio: 2.8 (calc) (ref <5.0)
Triglycerides: 92 mg/dL (ref <150)

## 2018-10-11 LAB — HEPATITIS C ANTIBODY
Hepatitis C Ab: NONREACTIVE
SIGNAL TO CUT-OFF: 0.01 (ref <1.00)

## 2018-10-14 ENCOUNTER — Encounter: Payer: Self-pay | Admitting: Internal Medicine

## 2018-10-14 ENCOUNTER — Ambulatory Visit: Payer: Medicare Other | Admitting: Internal Medicine

## 2018-10-14 VITALS — BP 108/60 | HR 76 | Temp 98.5°F | Ht 61.0 in | Wt 114.0 lb

## 2018-10-14 DIAGNOSIS — I10 Essential (primary) hypertension: Secondary | ICD-10-CM | POA: Diagnosis not present

## 2018-10-14 DIAGNOSIS — R634 Abnormal weight loss: Secondary | ICD-10-CM

## 2018-10-14 DIAGNOSIS — R05 Cough: Secondary | ICD-10-CM

## 2018-10-14 DIAGNOSIS — E7800 Pure hypercholesterolemia, unspecified: Secondary | ICD-10-CM | POA: Diagnosis not present

## 2018-10-14 DIAGNOSIS — R739 Hyperglycemia, unspecified: Secondary | ICD-10-CM

## 2018-10-14 DIAGNOSIS — Z0000 Encounter for general adult medical examination without abnormal findings: Secondary | ICD-10-CM

## 2018-10-14 DIAGNOSIS — R053 Chronic cough: Secondary | ICD-10-CM

## 2018-10-14 DIAGNOSIS — K5790 Diverticulosis of intestine, part unspecified, without perforation or abscess without bleeding: Secondary | ICD-10-CM

## 2018-10-14 NOTE — Progress Notes (Signed)
Provider:  Gwenith Spitz. Renato Gails, D.O., C.M.D. Location:   [facility]  Place of Service:   clinic  Previous PCP: Kermit Balo, DO Patient Care Team: Kermit Balo, DO as PCP - General (Geriatric Medicine) Beverely Low, MD as Consulting Physician (Orthopedic Surgery) Mateo Flow, MD as Consulting Physician (Ophthalmology)  Extended Emergency Contact Information Primary Emergency Contact: Sanon,JOSEPH Address: [address]          [place]  [address] Home Phone: [phone] Relation: None  Code Status: DNR Goals of Care: Advanced Directive information Advanced Directives 04/11/2018  Does Patient Have a Medical Advance Directive? Yes  Type of Estate agent of [place];Living will  Does patient want to make changes to medical advance directive? No - Patient declined  Copy of Healthcare Power of Attorney in Chart? Yes   Chief Complaint  Patient presents with  . Establish Care    CPE, discuss labs    HPI: Patient is a 72 y.o. female seen today for an annual physical exam.  She has lost 2 lbs from June.  Some mornings she weighs 111, other mornings 114 at home.  She has clothes she can fit in this year that she could not wear last year.  Still getting 2-3 days per week of exercise.  No improvement in diet.  Says she noticed her hba1c and was eating york peppermint patties.    Spirits are good.  No bowel changes.  Had one flare-up of diverticulosis last month.  Had been eating paydays, but she had belly pain from eating them on the left side, but it cleared up.  No hematochezia or melena.  Is due for cscope next year.  Plans to see Dr. Elnoria Howard this time.  Has a little dry cough for a couple months.  Does have drainage down the back of her throat daily.  Different from her usual in her throat.  Marland Kitchen  Has not smoked for 35-40 years.  Her mother had stopped 40 some years before and got diagnosed with advanced metastatic disease.    Just had mammogram which was normal and  bone density done which was also normal.   Past Medical History:  Diagnosis Date  . Disorder of bone and cartilage, unspecified   . 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 Diverticulitis of colon (without mention of hemorrhage)(562.11)   . Essential hypertension, benign   . Ganglion of tendon sheath   . Lumbago   . Other abnormal blood chemistry   . Other and unspecified hyperlipidemia   . Premenopausal menorrhagia   . Sebaceous cyst   . Shortness of breath    Past Surgical History:  Procedure Laterality Date  . ROTATOR CUFF REPAIR Left 10/06/13   tendon and ligament repair    reports that she has quit smoking. She started smoking about 48 years ago. She has a 5.00 pack-year smoking history. She has never used smokeless tobacco. She reports that she does not drink alcohol or use drugs.  Functional Status Survey:    Family History  Problem Relation Age of Onset  . Lung cancer Mother   . Osteoporosis Mother   . Coronary artery disease Father     Health Maintenance  Topic Date Due  . COLONOSCOPY  06/24/2019  . MAMMOGRAM  09/11/2020  . TETANUS/TDAP  11/08/2027  . INFLUENZA VACCINE  Completed  . DEXA SCAN  Completed  . Hepatitis C Screening  Completed  . PNA vac Low Risk Adult  Completed    Allergies  Allergen Reactions  . Iodine   . Penicillins   . Shellfish Allergy   . Statins Other (See Comments)    High LFT's    Outpatient Encounter Medications as of 10/14/2018  Medication Sig  . aspirin 81 MG tablet Take 81 mg by mouth daily. Take 1 tablet daily.  . Biotin 1000 MCG tablet Take 1,000 mcg by mouth daily.  . calcium carbonate (OS-CAL) 600 MG TABS tablet Take 600 mg by mouth 2 (two) times daily with a meal. Take 1 tablet twice daily.  . cetirizine (ZYRTEC) 10 MG tablet Take 10 mg by mouth daily. Take 1 tablet once daily as needed for allergies.  . cholecalciferol (VITAMIN D) 400 UNITS TABS tablet Take 400 Units by mouth daily. Take 2  tablets once daily for bones.  . Glucosamine-Chondroit-Vit C-Mn (GLUCOSAMINE CHONDR 1500 COMPLX PO) Take by mouth daily.  Marland Kitchen guaiFENesin (MUCINEX) 600 MG 12 hr tablet Take by mouth 2 (two) times daily.  Marland Kitchen lisinopril-hydrochlorothiazide (PRINZIDE,ZESTORETIC) 10-12.5 MG tablet TAKE 1 TABLET DAILY  . Multiple Vitamins-Minerals (MULTIVITAMIN WITH MINERALS) tablet Take 1 tablet by mouth daily.  Marland Kitchen PREMARIN vaginal cream   . Probiotic Product (PROBIOTIC DAILY PO) Take by mouth. Insync: 1 by mouth daily  . rosuvastatin (CRESTOR) 5 MG tablet TAKE ONE-HALF (1/2) TABLET AT BEDTIME ON MONDAY, WEDNESDAY, AND FRIDAY (AVOID GRAPEFRUIT PRODUCTS)  . vitamin B-12 (CYANOCOBALAMIN) 500 MCG tablet Take 500 mcg by mouth daily.  . vitamin C (ASCORBIC ACID) 500 MG tablet Take 500 mg by mouth daily.   No facility-administered encounter medications on file as of 10/14/2018.     Review of Systems  Constitutional: Positive for weight loss. Negative for chills, fever and malaise/fatigue.       2 lbs since June and eating more candy, otherwise diet and exercise the same  HENT: Negative for hearing loss.   Eyes: Negative for blurred vision.  Respiratory: Positive for cough. Negative for hemoptysis, sputum production, shortness of breath and wheezing.        Some dry cough, seems related to postnasal drip, but it worries her  Cardiovascular: Negative for chest pain, palpitations and leg swelling.  Gastrointestinal: Negative for abdominal pain, blood in stool, constipation, diarrhea, heartburn, melena, nausea and vomiting.  Genitourinary: Negative for dysuria.  Musculoskeletal: Negative for falls and joint pain.       Joints good now, recovered from last shoulder surgery  Skin: Negative for itching and rash.  Neurological: Negative for dizziness, sensory change and loss of consciousness.  Endo/Heme/Allergies: Does not bruise/bleed easily.  Psychiatric/Behavioral: Negative for depression and memory loss. The patient is  not nervous/anxious and does not have insomnia.     Vitals:   10/14/18 1342  BP: 108/60  Pulse: 76  Temp: 98.5 F (36.9 C)  TempSrc: Oral  SpO2: 96%  Weight: 114 lb (51.7 kg)  Height: 5\' 1"  (1.549 m)   Body mass index is 21.54 kg/m. Physical Exam Constitutional:      General: She is not in acute distress.    Appearance: Normal appearance. She is normal weight. She is not ill-appearing or toxic-appearing.  HENT:     Head: Normocephalic and atraumatic.     Right Ear: Tympanic membrane, ear canal and external ear normal. There is no impacted cerumen.     Left Ear: Tympanic membrane, ear canal and external ear normal. There is no impacted cerumen.     Nose: Nose normal. No congestion.  Mouth/Throat:     Mouth: Mucous membranes are moist.     Pharynx: Oropharynx is clear. No oropharyngeal exudate or posterior oropharyngeal erythema.  Eyes:     General: No scleral icterus.       Right eye: No discharge.        Left eye: No discharge.     Extraocular Movements: Extraocular movements intact.     Conjunctiva/sclera: Conjunctivae normal.     Pupils: Pupils are equal, round, and reactive to light.  Cardiovascular:     Rate and Rhythm: Normal rate and regular rhythm.     Pulses: Normal pulses.     Heart sounds: Normal heart sounds.  Pulmonary:     Effort: Pulmonary effort is normal.     Breath sounds: Normal breath sounds.  Abdominal:     General: Abdomen is flat. Bowel sounds are normal. There is no distension.     Palpations: Abdomen is soft. There is no mass.     Tenderness: There is no abdominal tenderness. There is no guarding or rebound.     Hernia: No hernia is present.  Genitourinary:    Comments: Sees gyn for pelvic exam and breast exam Musculoskeletal: Normal range of motion.        General: No swelling, tenderness, deformity or signs of injury.     Comments: Slight increased warmth right vs left knee  Skin:    General: Skin is warm and dry.     Capillary  Refill: Capillary refill takes less than 2 seconds.  Neurological:     General: No focal deficit present.     Mental Status: She is alert and oriented to person, place, and time.     Cranial Nerves: No cranial nerve deficit.     Sensory: No sensory deficit.     Motor: No weakness.     Coordination: Coordination normal.     Gait: Gait normal.     Deep Tendon Reflexes: Reflexes normal.  Psychiatric:        Mood and Affect: Mood normal.        Behavior: Behavior normal.        Thought Content: Thought content normal.        Judgment: Judgment normal.     Labs reviewed: Basic Metabolic Panel: Recent Labs    04/08/18 0840 10/10/18 0840  NA 140 141  K 4.3 4.0  CL 103 103  CO2 31 30  GLUCOSE 94 91  BUN 17 20  CREATININE 0.78 0.83  CALCIUM 9.6 9.7   Liver Function Tests: Recent Labs    04/08/18 0840 10/10/18 0840  AST 16 16  ALT 13 12  BILITOT 0.4 0.4  PROT 7.0 7.4   No results for input(s): LIPASE, AMYLASE in the last 8760 hours. No results for input(s): AMMONIA in the last 8760 hours. CBC: Recent Labs    04/08/18 0840 10/10/18 0840  WBC 6.0 6.3  NEUTROABS 1,806 2,526  HGB 13.1 13.1  HCT 37.6 39.0  MCV 87.4 89.9  PLT 223 240   Cardiac Enzymes: No results for input(s): CKTOTAL, CKMB, CKMBINDEX, TROPONINI in the last 8760 hours. BNP: Invalid input(s): POCBNP Lab Results  Component Value Date   HGBA1C 5.8 (H) 10/10/2018   No results found for: TSH No results found for: VITAMINB12 No results found for: FOLATE No results found for: IRON, TIBC, FERRITIN  Imaging and Procedures Recently: Normal mammo and bone density  Assessment/Plan 1. Annual physical exam -performed today -up to date on health  maintenance -cscope next year--plans to see Dr. Elnoria Howard this time  2. Essential hypertension, benign -bp well controlled, cont same regimen and monitor - EKG 12-Lead  3. Weight loss, non-intentional -new concern and mild at this point with just 2 lbs down on  our scales--plans to calibrate or replace her home scale - is concerned about lung cancer with her dry cough and distant tobacco use, but does not meet criteria for screening CT -no dry cough during visit whatsoever -will check CXR first to r/o anything worrisome - DG Chest 2 View -due for cscope in coming year -I'm not too worried about 2 lbs since this can occur with changes in hydration and BMS, but she is to report if this persists of she develops any alarm symptoms which we reviewed today  4. Pure hypercholesterolemia -had trended up, counseling provided (york peppermint patties and paydays likely culprits)  5. Hyperglycemia -also was up due to those in #4, cut down on those, cont exercise  6. Diverticulosis -notes she had some abd pain that she attributed to too many nuts from the paydays, but after stopping them, no further issues, no fevers  7. Persistent dry cough - she notes this--it's been around for months, but is intermittent, prior smoker, but small amount many years ago, but has had family history and friend recently with dx so she is anxious about this--needs reassurance - DG Chest 2 View  Labs/tests ordered:   Orders Placed This Encounter  Procedures  . DG Chest 2 View    Order Specific Question:   Reason for Exam (SYMPTOM  OR DIAGNOSIS REQUIRED)    Answer:   persistent dry cough, weight loss    Order Specific Question:   Preferred imaging location?    Answer:   [facility]  . CBC with Differential/Platelet    Standing Status:   Future    Standing Expiration Date:   10/15/2019  . COMPLETE METABOLIC PANEL WITH GFR    Standing Status:   Future    Standing Expiration Date:   10/15/2019  . Lipid panel    Standing Status:   Future    Standing Expiration Date:   10/15/2019    Order Specific Question:   Has the patient fasted?    Answer:   Yes  . Hemoglobin A1c    Standing Status:   Future    Standing Expiration Date:   10/15/2019  . EKG 12-Lead      Graeme Menees L. Chauntae Hults, D.O. Geriatrics [facility] [address], [place] [address] Cell Phone (Mon-Fri 8am-5pm):  [phone] On Call:  [phone] & follow prompts after 5pm & weekends Office Phone:  [phone] Office Fax:  [phone]

## 2018-10-16 NOTE — Addendum Note (Signed)
Addended by: Kermit BaloEED, Romy Ipock L on: 10/16/2018 07:49 AM   Modules accepted: Level of Service

## 2018-10-24 ENCOUNTER — Ambulatory Visit
Admission: RE | Admit: 2018-10-24 | Discharge: 2018-10-24 | Disposition: A | Discharge status: Home / Self Care | Payer: Medicare Other | Source: Ambulatory Visit | Attending: Internal Medicine | Admitting: Internal Medicine

## 2018-10-28 ENCOUNTER — Ambulatory Visit (INDEPENDENT_AMBULATORY_CARE_PROVIDER_SITE_OTHER): Payer: Medicare Other | Admitting: Internal Medicine

## 2018-10-28 ENCOUNTER — Encounter: Payer: Self-pay | Admitting: Internal Medicine

## 2018-10-28 VITALS — BP 110/60 | HR 68 | Temp 98.3°F | Ht 61.0 in | Wt 115.0 lb

## 2018-10-28 DIAGNOSIS — R918 Other nonspecific abnormal finding of lung field: Secondary | ICD-10-CM

## 2018-10-28 DIAGNOSIS — R05 Cough: Secondary | ICD-10-CM

## 2018-10-28 DIAGNOSIS — R059 Cough, unspecified: Secondary | ICD-10-CM

## 2018-10-28 NOTE — Progress Notes (Signed)
Location:  [facility] clinic Provider: Evania Lyne L. Renato Gails, D.O., C.M.D.  Code Status: DNR Goals of Care:  Advanced Directives 04/11/2018  Does Patient Have a Medical Advance Directive? Yes  Type of Estate agent of [place];Living will  Does patient want to make changes to medical advance directive? No - Patient declined  Copy of Healthcare Power of Attorney in Chart? Yes     Chief Complaint  Patient presents with  . Acute Visit    discuss chest xray    HPI: Patient is a 72 y.o. female seen today for an acute visit for abnormal chest xray.  Mild opacity was noted in right apex that could be scarring, atelectasis or developing infiltrate not there 10 years ago.  She'd c/o some chronic cough and unexplained weight loss.  Had a friend with recent lung ca that made her worried so she wanted some type of imaging done to be sure she was ok.  This was what was noted. Weight was back up one lb.  She ate a rich pound cake on Christmas Eve.    Past Medical History:  Diagnosis Date  . Disorder of bone and cartilage, unspecified   . 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 Diverticulitis of colon (without mention of hemorrhage)(562.11)   . Essential hypertension, benign   . Ganglion of tendon sheath   . Lumbago   . Other abnormal blood chemistry   . Other and unspecified hyperlipidemia   . Premenopausal menorrhagia   . Sebaceous cyst   . Shortness of breath     Past Surgical History:  Procedure Laterality Date  . ROTATOR CUFF REPAIR Left 10/06/13   tendon and ligament repair    Allergies  Allergen Reactions  . Iodine   . Penicillins   . Shellfish Allergy   . Statins Other (See Comments)    High LFT's    Outpatient Encounter Medications as of 10/28/2018  Medication Sig  . aspirin 81 MG tablet Take 81 mg by mouth daily. Take 1 tablet daily.  . Biotin 1000 MCG tablet Take 1,000 mcg by mouth daily.  . cetirizine (ZYRTEC) 10 MG tablet Take  10 mg by mouth daily. Take 1 tablet once daily as needed for allergies.  . cholecalciferol (VITAMIN D) 400 UNITS TABS tablet Take 400 Units by mouth daily. Take 2 tablets once daily for bones.  . Glucosamine-Chondroit-Vit C-Mn (GLUCOSAMINE CHONDR 1500 COMPLX PO) Take by mouth daily.  Marland Kitchen guaiFENesin (MUCINEX) 600 MG 12 hr tablet Take by mouth 2 (two) times daily.  Marland Kitchen lisinopril-hydrochlorothiazide (PRINZIDE,ZESTORETIC) 10-12.5 MG tablet TAKE 1 TABLET DAILY  . Multiple Vitamins-Minerals (MULTIVITAMIN WITH MINERALS) tablet Take 1 tablet by mouth daily.  Marland Kitchen PREMARIN vaginal cream   . Probiotic Product (PROBIOTIC DAILY PO) Take by mouth. Insync: 1 by mouth daily  . rosuvastatin (CRESTOR) 5 MG tablet TAKE ONE-HALF (1/2) TABLET AT BEDTIME ON MONDAY, WEDNESDAY, AND FRIDAY (AVOID GRAPEFRUIT PRODUCTS)  . vitamin B-12 (CYANOCOBALAMIN) 500 MCG tablet Take 500 mcg by mouth daily.  . vitamin C (ASCORBIC ACID) 500 MG tablet Take 500 mg by mouth daily.  . [DISCONTINUED] calcium carbonate (OS-CAL) 600 MG TABS tablet Take 600 mg by mouth 2 (two) times daily with a meal. Take 1 tablet twice daily.   No facility-administered encounter medications on file as of 10/28/2018.     Review of Systems:  Review of Systems  Constitutional: Negative for chills, fever, malaise/fatigue and weight loss.  HENT: Negative for congestion.  Respiratory: Positive for cough. Negative for hemoptysis, sputum production, shortness of breath and wheezing.   Cardiovascular: Negative for chest pain, palpitations and leg swelling.  Gastrointestinal: Negative for abdominal pain, blood in stool, constipation, diarrhea, heartburn and melena.  Genitourinary: Negative for dysuria.  Musculoskeletal: Negative for falls, joint pain and myalgias.  Neurological: Negative for dizziness and loss of consciousness.  Endo/Heme/Allergies: Does not bruise/bleed easily.  Psychiatric/Behavioral: Negative for depression and memory loss. The patient is not  nervous/anxious and does not have insomnia.     Health Maintenance  Topic Date Due  . COLONOSCOPY  06/24/2019  . MAMMOGRAM  09/11/2020  . TETANUS/TDAP  11/08/2027  . INFLUENZA VACCINE  Completed  . DEXA SCAN  Completed  . Hepatitis C Screening  Completed  . PNA vac Low Risk Adult  Completed    Physical Exam: Vitals:   10/28/18 1334  BP: 110/60  Pulse: 68  Temp: 98.3 F (36.8 C)  TempSrc: Oral  SpO2: 96%  Weight: 115 lb (52.2 kg)  Height: 5\' 1"  (1.549 m)   Body mass index is 21.73 kg/m. Physical Exam Vitals signs reviewed.  Constitutional:      General: She is not in acute distress.    Appearance: Normal appearance. She is normal weight. She is not ill-appearing.  HENT:     Head: Normocephalic and atraumatic.  Neck:     Musculoskeletal: Neck supple.  Cardiovascular:     Rate and Rhythm: Normal rate and regular rhythm.     Pulses: Normal pulses.     Heart sounds: Normal heart sounds.  Pulmonary:     Effort: Pulmonary effort is normal. No respiratory distress.     Breath sounds: Normal breath sounds. No stridor. No wheezing, rhonchi or rales.  Musculoskeletal: Normal range of motion.  Lymphadenopathy:     Cervical: No cervical adenopathy.  Neurological:     General: No focal deficit present.     Mental Status: She is alert and oriented to person, place, and time.  Psychiatric:        Mood and Affect: Mood normal.        Behavior: Behavior normal.        Thought Content: Thought content normal.        Judgment: Judgment normal.     Labs reviewed: Basic Metabolic Panel: Recent Labs    04/08/18 0840 10/10/18 0840  NA 140 141  K 4.3 4.0  CL 103 103  CO2 31 30  GLUCOSE 94 91  BUN 17 20  CREATININE 0.78 0.83  CALCIUM 9.6 9.7   Liver Function Tests: Recent Labs    04/08/18 0840 10/10/18 0840  AST 16 16  ALT 13 12  BILITOT 0.4 0.4  PROT 7.0 7.4   No results for input(s): LIPASE, AMYLASE in the last 8760 hours. No results for input(s): AMMONIA  in the last 8760 hours. CBC: Recent Labs    04/08/18 0840 10/10/18 0840  WBC 6.0 6.3  NEUTROABS 1,806 2,526  HGB 13.1 13.1  HCT 37.6 39.0  MCV 87.4 89.9  PLT 223 240   Lipid Panel: Recent Labs    04/08/18 0840 10/10/18 0840  CHOL 187 190  HDL 58 67  LDLCALC 110* 104*  TRIG 96 92  CHOLHDL 3.2 2.8   Lab Results  Component Value Date   HGBA1C 5.8 (H) 10/10/2018    Procedures since last visit: Dg Chest 2 View  Result Date: 10/24/2018 CLINICAL DATA:  Chronic persistent cough for months.  No fever.  EXAM: CHEST - 2 VIEW COMPARISON:  Mar 26, 2008 FINDINGS: The heart, hila, and mediastinum are normal. The left lung is clear. Mild opacity in the right apex, not seen previously. No other acute abnormalities. IMPRESSION: Mild opacity suspected in the right apex could represent scarring, atelectasis, or developing infiltrate. This was not seen in 2009. Recommend clinical correlation and short-term follow-up chest x-ray to ensure resolution. Electronically Signed   By: Gerome Samavid  Williams III M.D   On: 10/24/2018 14:14    Assessment/Plan 1. Cough - dry, apparently infrequent, but abnormality seen on chest xray done for this and couple of lb weight loss, pt also worried due to friend with recent lung cancer diagnosis and her own distant smoking history (but did not qualify for screening CT based on regular criteria) so xray was initially done with " Mild opacity suspected in the right apex could represent scarring, atelectasis, or developing infiltrate. This was not seen in 2009." - CT Chest Wo Contrast; Future  2. Infiltrate of upper lobe of right lung present on imaging study -as above, she does not have signs of infection and distant smoking history, small amt weight loss and anxiety about current situation so CT ordered to clarify  Labs/tests ordered:   Orders Placed This Encounter  Procedures  . CT Chest Wo Contrast    Epic order Wt. 114 Ins. [employer]  No needs  Fh w pt  Pt aware  of 301 site     Standing Status:   Future    Standing Expiration Date:   12/28/2019    Order Specific Question:   ** REASON FOR EXAM (FREE TEXT)    Answer:   right upper lobe infiltrate on chest xray; history of smoking many years ago    Order Specific Question:   Preferred imaging location?    Answer:   [facility]    Order Specific Question:   Radiology Contrast Protocol - do NOT remove file path    Answer:   [email or web address]    Next appt:  04/09/2019  Evie Croston L. Alantra Popoca, D.O. Geriatrics [facility] [address] [place], [place] [address] Cell Phone (Mon-Fri 8am-5pm):  [phone] On Call:  [phone] & follow prompts after 5pm & weekends Office Phone:  [phone] Office Fax:  [phone]

## 2018-11-04 ENCOUNTER — Other Ambulatory Visit: Payer: Medicare Other

## 2018-11-07 ENCOUNTER — Other Ambulatory Visit: Payer: Medicare Other

## 2018-11-08 ENCOUNTER — Ambulatory Visit
Admission: RE | Admit: 2018-11-08 | Discharge: 2018-11-08 | Disposition: A | Discharge status: Home / Self Care | Payer: Medicare Other | Source: Ambulatory Visit | Attending: Internal Medicine | Admitting: Internal Medicine

## 2018-11-08 DIAGNOSIS — R05 Cough: Secondary | ICD-10-CM | POA: Diagnosis not present

## 2018-11-08 DIAGNOSIS — R059 Cough, unspecified: Secondary | ICD-10-CM

## 2018-11-21 ENCOUNTER — Encounter: Payer: Self-pay | Admitting: Internal Medicine

## 2018-11-21 ENCOUNTER — Ambulatory Visit: Payer: Medicare HMO | Admitting: Internal Medicine

## 2018-11-21 VITALS — BP 108/60 | HR 61 | Temp 98.3°F | Ht 61.0 in | Wt 115.0 lb

## 2018-11-21 DIAGNOSIS — I700 Atherosclerosis of aorta: Secondary | ICD-10-CM

## 2018-11-21 NOTE — Progress Notes (Signed)
[facility] Provider:  Cordai Rodrigue L. Renato Gailseed, D.O., C.M.D.  Code Status: DNR Goals of Care:  Advanced Directives 04/11/2018  Does Patient Have a Medical Advance Directive? Yes  Type of Estate agentAdvance Directive Healthcare Power of BogardAttorney;Living will  Does patient want to make changes to medical advance directive? No - Patient declined  Copy of Healthcare Power of Attorney in Chart? Yes     Chief Complaint  Patient presents with  . Follow-up    discuss CT scan results    HPI: Patient is a 73 y.o. female seen today for f/u on CT scan results.  Tamara Billowita had been concerned about cough and just a couple lb weight loss.  She was anxious about these new findings after a friend of hers got a lung cancer diagnosis.  Pt herself did smoke in the distant past.  Xray of chest done on 10/24/18 showed mild opacity in the right apex that could be scarring, atelectasis or developing infiltrate not seen in 2009.    Due to her concerns, we opted to obtain a CT chest.  It shows apical scarring bilaterally and aortic arch and thoracic aorta scattered calcified plaque.    No cough noted during visit.  Weight stable at 115 lbs.  Reviewed results with her.  No active complaints.  No further imaging required or recommended.    Past Medical History:  Diagnosis Date  . Disorder of bone and cartilage, unspecified   . 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 Diverticulitis of colon (without mention of hemorrhage)(562.11)   . Essential hypertension, benign   . Ganglion of tendon sheath   . Lumbago   . Other abnormal blood chemistry   . Other and unspecified hyperlipidemia   . Premenopausal menorrhagia   . Sebaceous cyst   . Shortness of breath     Past Surgical History:  Procedure Laterality Date  . ROTATOR CUFF REPAIR Left 10/06/13   tendon and ligament repair    Allergies  Allergen Reactions  . Iodine   . Penicillins   . Shellfish Allergy   . Statins Other (See  Comments)    High LFT's    Outpatient Encounter Medications as of 11/21/2018  Medication Sig  . aspirin 81 MG tablet Take 81 mg by mouth daily. Take 1 tablet daily.  . Biotin 1000 MCG tablet Take 1,000 mcg by mouth daily.  . cetirizine (ZYRTEC) 10 MG tablet Take 10 mg by mouth daily. Take 1 tablet once daily as needed for allergies.  . cholecalciferol (VITAMIN D) 400 UNITS TABS tablet Take 400 Units by mouth daily. Take 2 tablets once daily for bones.  . Glucosamine-Chondroit-Vit C-Mn (GLUCOSAMINE CHONDR 1500 COMPLX PO) Take by mouth daily.  Marland Kitchen. guaiFENesin (MUCINEX) 600 MG 12 hr tablet Take by mouth 2 (two) times daily.  Marland Kitchen. lisinopril-hydrochlorothiazide (PRINZIDE,ZESTORETIC) 10-12.5 MG tablet TAKE 1 TABLET DAILY  . Multiple Vitamins-Minerals (MULTIVITAMIN WITH MINERALS) tablet Take 1 tablet by mouth daily.  Marland Kitchen. PREMARIN vaginal cream   . Probiotic Product (PROBIOTIC DAILY PO) Take by mouth. Insync: 1 by mouth daily  . rosuvastatin (CRESTOR) 5 MG tablet TAKE ONE-HALF (1/2) TABLET AT BEDTIME ON MONDAY, WEDNESDAY, AND FRIDAY (AVOID GRAPEFRUIT PRODUCTS)  . vitamin B-12 (CYANOCOBALAMIN) 500 MCG tablet Take 500 mcg by mouth daily.  . vitamin C (ASCORBIC ACID) 500 MG tablet Take 500 mg by mouth daily.   No facility-administered encounter medications on file as of 11/21/2018.     Review of Systems:  Review of Systems  Constitutional: Negative for chills, fever, malaise/fatigue and weight loss.  Eyes: Negative for blurred vision.  Respiratory: Negative for cough and shortness of breath.   Cardiovascular: Negative for chest pain, palpitations and leg swelling.  Gastrointestinal: Negative for abdominal pain, blood in stool, constipation, diarrhea and melena.  Genitourinary: Negative for dysuria.  Neurological: Negative for loss of consciousness and headaches.  Endo/Heme/Allergies: Does not bruise/bleed easily.  Psychiatric/Behavioral: Negative for depression and memory loss. The patient is not  nervous/anxious and does not have insomnia.     Health Maintenance  Topic Date Due  . COLONOSCOPY  06/24/2019  . MAMMOGRAM  09/11/2020  . TETANUS/TDAP  11/08/2027  . INFLUENZA VACCINE  Completed  . DEXA SCAN  Completed  . Hepatitis C Screening  Completed  . PNA vac Low Risk Adult  Completed    Physical Exam: Vitals:   11/21/18 1033  BP: 108/60  Pulse: 61  Temp: 98.3 F (36.8 C)  TempSrc: Oral  SpO2: 96%  Weight: 115 lb (52.2 kg)  Height: 5\' 1"  (1.549 m)   Body mass index is 21.73 kg/m. Physical Exam Constitutional:      Appearance: Normal appearance.  HENT:     Head: Normocephalic and atraumatic.  Cardiovascular:     Rate and Rhythm: Normal rate and regular rhythm.  Pulmonary:     Effort: Pulmonary effort is normal. No respiratory distress.     Breath sounds: Normal breath sounds. No wheezing, rhonchi or rales.  Musculoskeletal: Normal range of motion.  Skin:    General: Skin is warm and dry.     Capillary Refill: Capillary refill takes less than 2 seconds.  Neurological:     General: No focal deficit present.     Mental Status: She is alert and oriented to person, place, and time.     Labs reviewed: Basic Metabolic Panel: Recent Labs    04/08/18 0840 10/10/18 0840  NA 140 141  K 4.3 4.0  CL 103 103  CO2 31 30  GLUCOSE 94 91  BUN 17 20  CREATININE 0.78 0.83  CALCIUM 9.6 9.7   Liver Function Tests: Recent Labs    04/08/18 0840 10/10/18 0840  AST 16 16  ALT 13 12  BILITOT 0.4 0.4  PROT 7.0 7.4   No results for input(s): LIPASE, AMYLASE in the last 8760 hours. No results for input(s): AMMONIA in the last 8760 hours. CBC: Recent Labs    04/08/18 0840 10/10/18 0840  WBC 6.0 6.3  NEUTROABS 1,806 2,526  HGB 13.1 13.1  HCT 37.6 39.0  MCV 87.4 89.9  PLT 223 240   Lipid Panel: Recent Labs    04/08/18 0840 10/10/18 0840  CHOL 187 190  HDL 58 67  LDLCALC 110* 104*  TRIG 96 92  CHOLHDL 3.2 2.8   Lab Results  Component Value Date    HGBA1C 5.8 (H) 10/10/2018    Procedures since last visit: Dg Chest 2 View  Result Date: 10/24/2018 CLINICAL DATA:  Chronic persistent cough for months.  No fever. EXAM: CHEST - 2 VIEW COMPARISON:  Mar 26, 2008 FINDINGS: The heart, hila, and mediastinum are normal. The left lung is clear. Mild opacity in the right apex, not seen previously. No other acute abnormalities. IMPRESSION: Mild opacity suspected in the right apex could represent scarring, atelectasis, or developing infiltrate. This was not seen in 2009. Recommend clinical correlation and short-term follow-up chest x-ray to ensure resolution. Electronically Signed   By: Gerome Samavid  Williams III  M.D   On: 10/24/2018 14:14   Ct Chest Wo Contrast  Result Date: 11/08/2018 CLINICAL DATA:  Dry cough, unexplained weight loss Family history lung cancer Abnormal chest xray. EXAM: CT CHEST WITHOUT CONTRAST TECHNIQUE: Multidetector CT imaging of the chest was performed following the standard protocol without IV contrast. COMPARISON:  None. FINDINGS: Cardiovascular: Heart size normal. No pericardial effusion. No thoracic aortic aneurysm. Scattered calcified plaque in the aortic arch and descending thoracic segment. Mediastinum/Nodes: No definite hilar or mediastinal adenopathy, sensitivity decreased without IV contrast. Lungs/Pleura: No effusion. No pneumothorax. Biapical pleuroparenchymal scarring. No focal infiltrate or pulmonary nodule. Upper Abdomen: No acute findings. Musculoskeletal: Mild spondylitic changes from lower cervical through the midthoracic spine. No fracture or worrisome bone lesion. IMPRESSION: 1.  No acute findings. 2.  Aortic Atherosclerosis (ICD10-170.0). Electronically Signed   By: Corlis Leak M.D.   On: 11/08/2018 19:56    Assessment/Plan 1. Atherosclerosis of aortic arch (HCC) -counseled that we will continue her statin to keep her LDL at goal, continue healthy diet and regular exercise program  Labs/tests ordered:  No new; keep appt  for labs, visit and awv in the summer Next appt:  04/09/2019   Tamara Davidson L. Cleotilde Spadaccini, D.O. Geriatrics [facility] [address], [place] [address] Cell Phone (Mon-Fri 8am-5pm):  [phone] On Call:  [phone] & follow prompts after 5pm & weekends Office Phone:  [phone] Office Fax:  [phone]

## 2018-12-23 ENCOUNTER — Encounter: Payer: Self-pay | Admitting: Internal Medicine

## 2019-01-21 ENCOUNTER — Other Ambulatory Visit: Payer: Self-pay | Admitting: Internal Medicine

## 2019-04-01 DIAGNOSIS — Z20828 Contact with and (suspected) exposure to other viral communicable diseases: Secondary | ICD-10-CM | POA: Diagnosis not present

## 2019-04-09 ENCOUNTER — Other Ambulatory Visit: Payer: Medicare Other

## 2019-04-09 ENCOUNTER — Ambulatory Visit (INDEPENDENT_AMBULATORY_CARE_PROVIDER_SITE_OTHER): Payer: Medicare HMO | Admitting: Family

## 2019-04-09 ENCOUNTER — Encounter: Payer: Self-pay | Admitting: Family

## 2019-04-09 ENCOUNTER — Other Ambulatory Visit: Payer: Self-pay

## 2019-04-09 VITALS — BP 122/72 | HR 60 | Temp 98.4°F | Ht 61.0 in | Wt 114.8 lb

## 2019-04-09 DIAGNOSIS — E7800 Pure hypercholesterolemia, unspecified: Secondary | ICD-10-CM

## 2019-04-09 DIAGNOSIS — R739 Hyperglycemia, unspecified: Secondary | ICD-10-CM | POA: Diagnosis not present

## 2019-04-09 DIAGNOSIS — R634 Abnormal weight loss: Secondary | ICD-10-CM | POA: Diagnosis not present

## 2019-04-09 DIAGNOSIS — I10 Essential (primary) hypertension: Secondary | ICD-10-CM

## 2019-04-09 DIAGNOSIS — Z0000 Encounter for general adult medical examination without abnormal findings: Secondary | ICD-10-CM

## 2019-04-09 NOTE — Patient Instructions (Signed)
Tamara Davidson , Thank you for taking time to come for your Medicare Wellness Visit. I appreciate your ongoing commitment to your health goals. Please review the following plan we discussed and let me know if I can assist you in the future.   Screening recommendations/referrals: Colonoscopy due 06/24/2019 Mammogram; Up to date  Bone Density: Up to date Recommended yearly ophthalmology/optometry visit for glaucoma screening and checkup Recommended yearly dental visit for hygiene and checkup  Vaccinations: Influenza vaccine: Up to date Pneumococcal vaccine : Up to date Tdap vaccine: Up to date due 11/08/2027 Shingles vaccine: Up to date   Advanced directives: Yes   Conditions/risks identified: advance age female >54 yrs,Hypertension,Hyperlipidemia,hx smoking  Next appointment:1 year    Preventive Care 18 Years and Older, Female Preventive care refers to lifestyle choices and visits with your health care provider that can promote health and wellness. What does preventive care include?  A yearly physical exam. This is also called an annual well check.  Dental exams once or twice a year.  Routine eye exams. Ask your health care provider how often you should have your eyes checked.  Personal lifestyle choices, including:  Daily care of your teeth and gums.  Regular physical activity.  Eating a healthy diet.  Avoiding tobacco and drug use.  Limiting alcohol use.  Practicing safe sex.  Taking low-dose aspirin every day.  Taking vitamin and mineral supplements as recommended by your health care provider. What happens during an annual well check? The services and screenings done by your health care provider during your annual well check will depend on your age, overall health, lifestyle risk factors, and family history of disease. Counseling  Your health care provider may ask you questions about your:  Alcohol use.  Tobacco use.  Drug use.  Emotional well-being.  Home  and relationship well-being.  Sexual activity.  Eating habits.  History of falls.  Memory and ability to understand (cognition).  Work and work Statistician.  Reproductive health. Screening  You may have the following tests or measurements:  Height, weight, and BMI.  Blood pressure.  Lipid and cholesterol levels. These may be checked every 5 years, or more frequently if you are over 62 years old.  Skin check.  Lung cancer screening. You may have this screening every year starting at age 14 if you have a 30-pack-year history of smoking and currently smoke or have quit within the past 15 years.  Fecal occult blood test (FOBT) of the stool. You may have this test every year starting at age 50.  Flexible sigmoidoscopy or colonoscopy. You may have a sigmoidoscopy every 5 years or a colonoscopy every 10 years starting at age 2.  Hepatitis C blood test.  Hepatitis B blood test.  Sexually transmitted disease (STD) testing.  Diabetes screening. This is done by checking your blood sugar (glucose) after you have not eaten for a while (fasting). You may have this done every 1-3 years.  Bone density scan. This is done to screen for osteoporosis. You may have this done starting at age 65.  Mammogram. This may be done every 1-2 years. Talk to your health care provider about how often you should have regular mammograms. Talk with your health care provider about your test results, treatment options, and if necessary, the need for more tests. Vaccines  Your health care provider may recommend certain vaccines, such as:  Influenza vaccine. This is recommended every year.  Tetanus, diphtheria, and acellular pertussis (Tdap, Td) vaccine. You may need  a Td booster every 10 years.  Zoster vaccine. You may need this after age 74.  Pneumococcal 13-valent conjugate (PCV13) vaccine. One dose is recommended after age 37.  Pneumococcal polysaccharide (PPSV23) vaccine. One dose is recommended  after age 30. Talk to your health care provider about which screenings and vaccines you need and how often you need them. This information is not intended to replace advice given to you by your health care provider. Make sure you discuss any questions you have with your health care provider. Document Released: 11/12/2015 Document Revised: 07/05/2016 Document Reviewed: 08/17/2015 Elsevier Interactive Patient Education  2017 [place] Prevention in the Home Falls can cause injuries. They can happen to people of all ages. There are many things you can do to make your home safe and to help prevent falls. What can I do on the outside of my home?  Regularly fix the edges of walkways and driveways and fix any cracks.  Remove anything that might make you trip as you walk through a door, such as a raised step or threshold.  Trim any bushes or trees on the path to your home.  Use bright outdoor lighting.  Clear any walking paths of anything that might make someone trip, such as rocks or tools.  Regularly check to see if handrails are loose or broken. Make sure that both sides of any steps have handrails.  Any raised decks and porches should have guardrails on the edges.  Have any leaves, snow, or ice cleared regularly.  Use sand or salt on walking paths during winter.  Clean up any spills in your garage right away. This includes oil or grease spills. What can I do in the bathroom?  Use night lights.  Install grab bars by the toilet and in the tub and shower. Do not use towel bars as grab bars.  Use non-skid mats or decals in the tub or shower.  If you need to sit down in the shower, use a plastic, non-slip stool.  Keep the floor dry. Clean up any water that spills on the floor as soon as it happens.  Remove soap buildup in the tub or shower regularly.  Attach bath mats securely with double-sided non-slip rug tape.  Do not have throw rugs and other things on the floor  that can make you trip. What can I do in the bedroom?  Use night lights.  Make sure that you have a light by your bed that is easy to reach.  Do not use any sheets or blankets that are too big for your bed. They should not hang down onto the floor.  Have a firm chair that has side arms. You can use this for support while you get dressed.  Do not have throw rugs and other things on the floor that can make you trip. What can I do in the kitchen?  Clean up any spills right away.  Avoid walking on wet floors.  Keep items that you use a lot in easy-to-reach places.  If you need to reach something above you, use a strong step stool that has a grab bar.  Keep electrical cords out of the way.  Do not use floor polish or wax that makes floors slippery. If you must use wax, use non-skid floor wax.  Do not have throw rugs and other things on the floor that can make you trip. What can I do with my stairs?  Do not leave any items on the  stairs.  Make sure that there are handrails on both sides of the stairs and use them. Fix handrails that are broken or loose. Make sure that handrails are as long as the stairways.  Check any carpeting to make sure that it is firmly attached to the stairs. Fix any carpet that is loose or worn.  Avoid having throw rugs at the top or bottom of the stairs. If you do have throw rugs, attach them to the floor with carpet tape.  Make sure that you have a light switch at the top of the stairs and the bottom of the stairs. If you do not have them, ask someone to add them for you. What else can I do to help prevent falls?  Wear shoes that:  Do not have high heels.  Have rubber bottoms.  Are comfortable and fit you well.  Are closed at the toe. Do not wear sandals.  If you use a stepladder:  Make sure that it is fully opened. Do not climb a closed stepladder.  Make sure that both sides of the stepladder are locked into place.  Ask someone to hold it  for you, if possible.  Clearly mark and make sure that you can see:  Any grab bars or handrails.  First and last steps.  Where the edge of each step is.  Use tools that help you move around (mobility aids) if they are needed. These include:  Canes.  Walkers.  Scooters.  Crutches.  Turn on the lights when you go into a dark area. Replace any light bulbs as soon as they burn out.  Set up your furniture so you have a clear path. Avoid moving your furniture around.  If any of your floors are uneven, fix them.  If there are any pets around you, be aware of where they are.  Review your medicines with your doctor. Some medicines can make you feel dizzy. This can increase your chance of falling. Ask your doctor what other things that you can do to help prevent falls. This information is not intended to replace advice given to you by your health care provider. Make sure you discuss any questions you have with your health care provider. Document Released: 08/12/2009 Document Revised: 03/23/2016 Document Reviewed: 11/20/2014 Elsevier Interactive Patient Education  2017 [employer].

## 2019-04-09 NOTE — Progress Notes (Signed)
Subjective:   Tamara Davidson is a 73 y.o. female who presents for Medicare Annual (Subsequent) preventive examination.  Review of Systems:   Cardiac Risk Factors include: advanced age (>2855men, 29>65 women);hypertension;dyslipidemia;smoking/ tobacco exposure     Objective:     Vitals: BP 122/72   Pulse 60   Temp 98.4 F (36.9 C) (Oral)   Ht 5\' 1"  (1.549 m)   Wt 114 lb 12.8 oz (52.1 kg)   SpO2 96%   BMI 21.69 kg/m   Body mass index is 21.69 kg/m.  Advanced Directives 04/09/2019 04/11/2018 04/08/2018 08/06/2017 03/30/2017 03/21/2017 08/25/2016  Does Patient Have a Medical Advance Directive? Yes Yes Yes Yes Yes Yes Yes  Type of Advance Directive Living will Healthcare Power of [place]orney;Living will Healthcare Power of [place]Attorney;Living will Healthcare Power of [place]orney Healthcare Power of [place]orney;Living will Healthcare Power of [place]orney;Living will -  Does patient want to make changes to medical advance directive? - No - Patient declined No - Patient declined - - No - Patient declined -  Copy of Healthcare Power of Attorney in Chart? - Yes Yes Yes Yes No - copy requested -    Tobacco Social History   Tobacco Use  Smoking Status Former Smoker  . Packs/day: 1.00  . Years: 5.00  . Pack years: 5.00  . Start date: 10/30/1969  Smokeless Tobacco Never Used     Counseling given: Not Answered   Clinical Intake:  Pre-visit preparation completed: No  Pain : No/denies pain     BMI - recorded: 21.69 Nutritional Status: BMI of 19-24  Normal Nutritional Risks: None Diabetes: No  How often do you need to have someone help you when you read instructions, pamphlets, or other written materials from your doctor or pharmacy?: 2 - Rarely What is the last grade level you completed in school?: college undergrade  Interpreter Needed?: No  Information entered by :: Garrison Michie FNP-C   Past Medical History:  Diagnosis Date  . Disorder of bone and cartilage, unspecified   . Disorders of  bursae and tendons in shoulder region, unspecified   . Diverticulitis of colon (without mention of hemorrhage)(562.11)   . Essential hypertension, benign   . Ganglion of tendon sheath   . Lumbago   . Other abnormal blood chemistry   . Other and unspecified hyperlipidemia   . Premenopausal menorrhagia   . Sebaceous cyst   . Shortness of breath    Past Surgical History:  Procedure Laterality Date  . ROTATOR CUFF REPAIR Left 10/06/13   tendon and ligament repair   Family History  Problem Relation Age of Onset  . Lung cancer Mother   . Osteoporosis Mother   . Coronary artery disease Father    Social History   Socioeconomic History  . Marital status: Married    Spouse name: Not on file  . Number of children: Not on file  . Years of education: Not on file  . Highest education level: Not on file  Occupational History  . Not on file  Social Needs  . Financial resource strain: Not hard at all  . Food insecurity:    Worry: Never true    Inability: Never true  . Transportation needs:    Medical: No    Non-medical: No  Tobacco Use  . Smoking status: Former Smoker    Packs/day: 1.00    Years: 5.00    Pack years: 5.00    Start date: 10/30/1969  . Smokeless tobacco: Never Used  Substance and  Sexual Activity  . Alcohol use: No    Alcohol/week: 0.0 standard drinks  . Drug use: No  . Sexual activity: Not on file  Lifestyle  . Physical activity:    Days per week: 3 days    Minutes per session: 40 min  . Stress: Not at all  Relationships  . Social connections:    Talks on phone: More than three times a week    Gets together: More than three times a week    Attends religious service: More than 4 times per year    Active member of club or organization: No    Attends meetings of clubs or organizations: Never    Relationship status: Married  Other Topics Concern  . Not on file  Social History Narrative   Married 23 years, with Broadus John for 29 years    Outpatient Encounter  Medications as of 04/09/2019  Medication Sig  . aspirin 81 MG tablet Take 81 mg by mouth daily. Take 1 tablet daily.  . Biotin 1000 MCG tablet Take 1,000 mcg by mouth daily.  . cetirizine (ZYRTEC) 10 MG tablet Take 10 mg by mouth daily. Take 1 tablet once daily as needed for allergies.  . cholecalciferol (VITAMIN D) 400 UNITS TABS tablet Take 400 Units by mouth daily. Take 2 tablets once daily for bones.  . Glucosamine-Chondroit-Vit C-Mn (GLUCOSAMINE CHONDR 1500 COMPLX PO) Take by mouth daily.  Marland Kitchen guaiFENesin (MUCINEX) 600 MG 12 hr tablet Take by mouth 2 (two) times daily.  Marland Kitchen lisinopril-hydrochlorothiazide (PRINZIDE,ZESTORETIC) 10-12.5 MG tablet TAKE 1 TABLET DAILY  . Multiple Vitamins-Minerals (MULTIVITAMIN WITH MINERALS) tablet Take 1 tablet by mouth daily.  Marland Kitchen PREMARIN vaginal cream   . Probiotic Product (PROBIOTIC DAILY PO) Take by mouth. Insync: 1 by mouth daily  . rosuvastatin (CRESTOR) 5 MG tablet TAKE ONE-HALF (1/2) TABLET AT BEDTIME ON MONDAY, WEDNESDAY, AND FRIDAY (AVOID GRAPEFRUIT PRODUCTS)  . vitamin B-12 (CYANOCOBALAMIN) 500 MCG tablet Take 500 mcg by mouth daily.  . vitamin C (ASCORBIC ACID) 500 MG tablet Take 500 mg by mouth daily.   No facility-administered encounter medications on file as of 04/09/2019.     Activities of Daily Living In your present state of health, do you have any difficulty performing the following activities: 04/09/2019  Vision? N  Difficulty concentrating or making decisions? N  Walking or climbing stairs? N  Dressing or bathing? N  Doing errands, shopping? N  Preparing Food and eating ? N  Using the Toilet? N  In the past six months, have you accidently leaked urine? N  Do you have problems with loss of bowel control? N  Managing your Medications? N  Managing your Finances? N  Housekeeping or managing your Housekeeping? N  Some recent data might be hidden    Patient Care Team: Gayland Curry, DO as PCP - General (Geriatric Medicine) Netta Cedars, MD as Consulting Physician (Orthopedic Surgery) Monna Fam, MD as Consulting Physician (Ophthalmology)    Assessment:   This is a routine wellness examination for Tamara Davidson.  Exercise Activities and Dietary recommendations Current Exercise Habits: Structured exercise class, Type of exercise: strength training/weights, Time (Minutes): 45, Frequency (Times/Week): 3, Weekly Exercise (Minutes/Week): 135, Intensity: Moderate, Exercise limited by: None identified  Goals    . Lose Belly Fat     Starting today pt will increase gym days to 4 days a week.        Fall Risk Fall Risk  04/09/2019 10/14/2018 04/11/2018 04/08/2018 10/04/2017  Falls in the  past year? 0 0 No Yes No  Number falls in past yr: 0 0 - 1 -  Injury with Fall? 0 0 - Yes -  Comment - - - R shoulder -   Is the patient's home free of loose throw rugs in walkways, pet beds, electrical cords, etc?   yes      Grab bars in the bathroom? yes      Handrails on the stairs?   No stairs in the house       Adequate lighting?   yes  Depression Screen PHQ 2/9 Scores 04/09/2019 10/14/2018 04/11/2018 04/08/2018  PHQ - 2 Score 0 0 0 0     Cognitive Function MMSE - Mini Mental State Exam 04/09/2019 04/08/2018 03/21/2017 02/10/2016 07/13/2014  Orientation to time 5 5 5 5 5   Orientation to Place 5 5 5 5 5   Registration 3 3 3 3 3   Attention/ Calculation 5 5 5 5 5   Recall 3 3 3 3 2   Language- name 2 objects 2 2 2 2 2   Language- repeat 1 1 1 1 1   Language- follow 3 step command 3 3 3 3 3   Language- read & follow direction 1 1 1 1 1   Write a sentence 1 1 1 1 1   Copy design 1 1 1 1 1   Total score 30 30 30 30 29         Immunization History  Administered Date(s) Administered  . Influenza Whole 07/28/2010, 07/11/2012  . Influenza, High Dose Seasonal PF 07/12/2017, 07/22/2018  . Influenza,inj,Quad PF,6+ Mos 07/13/2014, 08/12/2015, 06/15/2016  . Influenza-Unspecified 07/30/2013  . Pneumococcal Conjugate-13 10/08/2014  .  Pneumococcal Polysaccharide-23 12/06/2010  . Tdap 10/15/2007, 11/07/2017  . Zoster 01/25/2013  . Zoster Recombinat (Shingrix) 09/26/2017, 11/30/2017    Qualifies for Shingles Vaccine? Up to date   Screening Tests Health Maintenance  Topic Date Due  . INFLUENZA VACCINE  05/31/2019  . COLONOSCOPY  06/24/2019  . MAMMOGRAM  09/11/2020  . TETANUS/TDAP  11/08/2027  . DEXA SCAN  Completed  . Hepatitis C Screening  Completed  . PNA vac Low Risk Adult  Completed    Cancer Screenings: Lung: Low Dose CT Chest recommended if Age 30-80 years, 30 pack-year currently smoking OR have quit w/in 15years. Patient does not qualify. Breast:  Up to date on Mammogram? Yes   Up to date of Bone Density/Dexa? Yes Colorectal: due 06/24/2019  Additional Screenings: Hepatitis C Screening: Low risk   Plan:  - Colonoscopy due 06/24/2019    I have personally reviewed and noted the following in the patient's chart:   . Medical and social history . Use of alcohol, tobacco or illicit drugs  . Current medications and supplements . Functional ability and status . Nutritional status . Physical activity . Advanced directives . List of other physicians . Hospitalizations, surgeries, and ER visits in previous 12 months . Vitals . Screenings to include cognitive, depression, and falls . Referrals and appointments  In addition, I have reviewed and discussed with patient certain preventive protocols, quality metrics, and best practice recommendations. A written personalized care plan for preventive services as well as general preventive health recommendations were provided to patient.  Caesar Bookmaninah C Jamin Panther, NP  04/09/2019

## 2019-04-10 LAB — HEMOGLOBIN A1C
Hgb A1c MFr Bld: 5.7 % of total Hgb — ABNORMAL HIGH (ref <5.7)
Mean Plasma Glucose: 117 (calc)
eAG (mmol/L): 6.5 (calc)

## 2019-04-10 LAB — COMPLETE METABOLIC PANEL WITH GFR
AG Ratio: 1.6 (calc) (ref 1.0–2.5)
ALT: 8 U/L (ref 6–29)
AST: 13 U/L (ref 10–35)
Albumin: 4.1 g/dL (ref 3.6–5.1)
Alkaline phosphatase (APISO): 71 U/L (ref 37–153)
BUN: 14 mg/dL (ref 7–25)
CO2: 30 mmol/L (ref 20–32)
Calcium: 9.4 mg/dL (ref 8.6–10.4)
Chloride: 105 mmol/L (ref 98–110)
Creat: 0.89 mg/dL (ref 0.60–0.93)
GFR, Est African American: 75 mL/min/{1.73_m2} (ref >=60)
GFR, Est Non African American: 64 mL/min/{1.73_m2} (ref >=60)
Globulin: 2.6 g/dL (calc) (ref 1.9–3.7)
Glucose, Bld: 94 mg/dL (ref 65–99)
Potassium: 3.9 mmol/L (ref 3.5–5.3)
Sodium: 141 mmol/L (ref 135–146)
Total Bilirubin: 0.3 mg/dL (ref 0.2–1.2)
Total Protein: 6.7 g/dL (ref 6.1–8.1)

## 2019-04-10 LAB — CBC WITH DIFFERENTIAL/PLATELET
Absolute Monocytes: 339 cells/uL (ref 200–950)
Basophils Absolute: 32 cells/uL (ref 0–200)
Basophils Relative: 0.5 %
Eosinophils Absolute: 141 cells/uL (ref 15–500)
Eosinophils Relative: 2.2 %
HCT: 38.5 % (ref 35.0–45.0)
Hemoglobin: 13.1 g/dL (ref 11.7–15.5)
Lymphs Abs: 3386 cells/uL (ref 850–3900)
MCH: 30.8 pg (ref 27.0–33.0)
MCHC: 34 g/dL (ref 32.0–36.0)
MCV: 90.6 fL (ref 80.0–100.0)
MPV: 9.6 fL (ref 7.5–12.5)
Monocytes Relative: 5.3 %
Neutro Abs: 2502 cells/uL (ref 1500–7800)
Neutrophils Relative %: 39.1 %
Platelets: 233 10*3/uL (ref 140–400)
RBC: 4.25 10*6/uL (ref 3.80–5.10)
RDW: 13.4 % (ref 11.0–15.0)
Total Lymphocyte: 52.9 %
WBC: 6.4 10*3/uL (ref 3.8–10.8)

## 2019-04-10 LAB — LIPID PANEL
Cholesterol: 165 mg/dL (ref <200)
HDL: 59 mg/dL (ref >=50)
LDL Cholesterol (Calc): 86 mg/dL (calc)
Non-HDL Cholesterol (Calc): 106 mg/dL (calc) (ref <130)
Total CHOL/HDL Ratio: 2.8 (calc) (ref <5.0)
Triglycerides: 103 mg/dL (ref <150)

## 2019-04-11 ENCOUNTER — Ambulatory Visit: Payer: Self-pay

## 2019-04-14 ENCOUNTER — Encounter: Payer: Self-pay | Admitting: Internal Medicine

## 2019-04-14 ENCOUNTER — Ambulatory Visit (INDEPENDENT_AMBULATORY_CARE_PROVIDER_SITE_OTHER): Payer: Medicare HMO | Admitting: Internal Medicine

## 2019-04-14 ENCOUNTER — Other Ambulatory Visit: Payer: Self-pay

## 2019-04-14 ENCOUNTER — Encounter: Payer: Self-pay | Admitting: Family

## 2019-04-14 VITALS — BP 116/74 | HR 86 | Temp 98.0°F | Wt 114.0 lb

## 2019-04-14 DIAGNOSIS — Z1211 Encounter for screening for malignant neoplasm of colon: Secondary | ICD-10-CM | POA: Diagnosis not present

## 2019-04-14 DIAGNOSIS — E7800 Pure hypercholesterolemia, unspecified: Secondary | ICD-10-CM | POA: Diagnosis not present

## 2019-04-14 DIAGNOSIS — E538 Deficiency of other specified B group vitamins: Secondary | ICD-10-CM

## 2019-04-14 DIAGNOSIS — I10 Essential (primary) hypertension: Secondary | ICD-10-CM | POA: Diagnosis not present

## 2019-04-14 DIAGNOSIS — R739 Hyperglycemia, unspecified: Secondary | ICD-10-CM | POA: Diagnosis not present

## 2019-04-14 NOTE — Progress Notes (Signed)
Patient ID: Tamara Davidson, female   DOB: [birth year], 73 y.o.   MRN: [id number] This service is provided via telemedicine  No vital signs collected/recorded due to the encounter was a telemedicine visit.   Location of patient (ex: home, work):  HOME  Patient consents to a telephone visit:  YES  Location of the provider (ex: office, home):  OFFICE  Name of any referring provider:  Heidi Lemay, DO  Names of all persons participating in the telemedicine service and their role in the encounter:  PATIENT, Harmon, McGreg[place], [place], DO  Time spent on call:  6:18    Provider:  Eadie Repetto L. Mariea Clonts, D.O., C.M.D.  Goals of Care:  Advanced Directives 04/14/2019  Does Patient Have a Medical Advance Directive? No  Type of Paramedic of [place];Living will  Does patient want to make changes to medical advance directive? -  Copy of [place] in Chart? Yes - validated most recent copy scanned in chart (See row information)  Would patient like information on creating a medical advance directive? No - Patient declined   Chief Complaint  Patient presents with  . Medical Management of Chronic Issues    62mth follow-up    HPI: Patient is a 73 y.o. female seen today for medical management of chronic diseases.    Nothing is new.    On 6/2, they went for covid testing and both were negative.  They went to get peace of mind.  We reviewed her labs are improved.   She is doing gardening, cleaning out closets and doing things she put on the back shelf for a while. [facility]--she does intend to go to those--cardio exercise class.    She is eating more fruit.  She has been baking more.  She can't really explain it.  She's glad things are going in the right direction.    No new orthopedic complaints.    She has her cscope coming up in August.  Wants to go to Dr. Benson Norway off [address] b/c Wille Glaser goes there (her  husband).    Past Medical History:  Diagnosis Date  . Disorder of bone and cartilage, unspecified   . 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 Diverticulitis of colon (without mention of hemorrhage)(562.11)   . Essential hypertension, benign   . Ganglion of tendon sheath   . Lumbago   . Other abnormal blood chemistry   . Other and unspecified hyperlipidemia   . Premenopausal menorrhagia   . Sebaceous cyst   . Shortness of breath     Past Surgical History:  Procedure Laterality Date  . ROTATOR CUFF REPAIR Left 10/06/13   tendon and ligament repair    Allergies  Allergen Reactions  . Chocolate Diarrhea  . Iodine   . Penicillins   . Shellfish Allergy   . Statins Other (See Comments)    High LFT's  . Strawberry (Diagnostic) Itching    Outpatient Encounter Medications as of 04/14/2019  Medication Sig  . aspirin 81 MG tablet Take 81 mg by mouth daily.   . Biotin 1000 MCG tablet Take 1,000 mcg by mouth daily.  . calcium carbonate (CALCIUM 600) 1500 (600 Ca) MG TABS tablet Take 1 tablet by mouth 2 (two) times daily with a meal.  . cetirizine (ZYRTEC) 10 MG tablet Take 10 mg by mouth daily.   . cholecalciferol (VITAMIN D) 400 UNITS TABS tablet  Take 400 Units by mouth daily.   . Glucosamine-Chondroit-Vit C-Mn (GLUCOSAMINE CHONDR 1500 COMPLX PO) Take by mouth daily.  Marland Kitchen. guaiFENesin (MUCINEX) 600 MG 12 hr tablet Take 600 mg by mouth as needed.   Marland Kitchen. lisinopril-hydrochlorothiazide (PRINZIDE,ZESTORETIC) 10-12.5 MG tablet TAKE 1 TABLET DAILY  . Multiple Vitamins-Minerals (MULTIVITAMIN WITH MINERALS) tablet Take 1 tablet by mouth daily.  . Probiotic Product (PROBIOTIC DAILY PO) Take 1 tablet by mouth daily.   . rosuvastatin (CRESTOR) 5 MG tablet TAKE ONE-HALF (1/2) TABLET AT BEDTIME ON MONDAY, WEDNESDAY, AND FRIDAY (AVOID GRAPEFRUIT PRODUCTS)  . vitamin B-12 (CYANOCOBALAMIN) 500 MCG tablet Take 500 mcg by mouth daily.  . vitamin C (ASCORBIC ACID) 500 MG tablet  Take 500 mg by mouth daily.  . [DISCONTINUED] PREMARIN vaginal cream    No facility-administered encounter medications on file as of 04/14/2019.     Review of Systems:  ROS  Health Maintenance  Topic Date Due  . INFLUENZA VACCINE  05/31/2019  . COLONOSCOPY  06/24/2019  . MAMMOGRAM  09/11/2020  . TETANUS/TDAP  11/08/2027  . DEXA SCAN  Completed  . Hepatitis C Screening  Completed  . PNA vac Low Risk Adult  Completed    Physical Exam: Could not be performed as visit non face-to-face via phone   Labs reviewed: Basic Metabolic Panel: Recent Labs    10/10/18 0840 04/09/19 0842  NA 141 141  K 4.0 3.9  CL 103 105  CO2 30 30  GLUCOSE 91 94  BUN 20 14  CREATININE 0.83 0.89  CALCIUM 9.7 9.4   Liver Function Tests: Recent Labs    10/10/18 0840 04/09/19 0842  AST 16 13  ALT 12 8  BILITOT 0.4 0.3  PROT 7.4 6.7   No results for input(s): LIPASE, AMYLASE in the last 8760 hours. No results for input(s): AMMONIA in the last 8760 hours. CBC: Recent Labs    10/10/18 0840 04/09/19 0842  WBC 6.3 6.4  NEUTROABS 2,526 2,502  HGB 13.1 13.1  HCT 39.0 38.5  MCV 89.9 90.6  PLT 240 233   Lipid Panel: Recent Labs    10/10/18 0840 04/09/19 0842  CHOL 190 165  HDL 67 59  LDLCALC 104* 86  TRIG 92 103  CHOLHDL 2.8 2.8   Lab Results  Component Value Date   HGBA1C 5.7 (H) 04/09/2019   Assessment/Plan 1. Colon cancer screening - due in late august early sept with Dr. Elnoria HowardHung at her request  - Ambulatory referral to Gastroenterology  2. Hyperglycemia - improved this time, cont doing same routine - COMPLETE METABOLIC PANEL WITH GFR; Future - Hemoglobin A1c; Future  3. Pure hypercholesterolemia - improved also, cont diet and exercise--get back to [facility] outside and socially distant if possible - Lipid panel; Future  4. Essential hypertension, benign -bp at goal with current routine - CBC with Differential/Platelet; Future - COMPLETE METABOLIC PANEL WITH GFR;  Future  5. B12 deficiency - f/u lab - CBC with Differential/Platelet; Future - Vitamin B12; Future  Labs/tests ordered:   Lab Orders     CBC with Differential/Platelet     COMPLETE METABOLIC PANEL WITH GFR     Hemoglobin A1c     Lipid panel     Vitamin B12 GI referral  Next appt:  6 mos for CPE, fasting labs before  Non face-to-face time spent on televisit:  25 minutes reviewing records, labs, ordering tests and talking with patient  Evrett Hakim L. Jovin Fester, D.O. [facility]  [address], [place] [address] Cell Phone (Mon-Fri 8am-5pm):  [phone] On Call:  [phone] & follow prompts after 5pm & weekends Office Phone:  [phone] Office Fax:  [phone]

## 2019-05-12 DIAGNOSIS — Z1211 Encounter for screening for malignant neoplasm of colon: Secondary | ICD-10-CM | POA: Diagnosis not present

## 2019-05-12 DIAGNOSIS — I10 Essential (primary) hypertension: Secondary | ICD-10-CM | POA: Diagnosis not present

## 2019-05-12 DIAGNOSIS — E785 Hyperlipidemia, unspecified: Secondary | ICD-10-CM | POA: Diagnosis not present

## 2019-05-14 DIAGNOSIS — Z1159 Encounter for screening for other viral diseases: Secondary | ICD-10-CM | POA: Diagnosis not present

## 2019-05-27 ENCOUNTER — Encounter: Payer: Self-pay | Admitting: Gastroenterology

## 2019-06-03 DIAGNOSIS — K5730 Diverticulosis of large intestine without perforation or abscess without bleeding: Secondary | ICD-10-CM | POA: Diagnosis not present

## 2019-06-03 DIAGNOSIS — Z1211 Encounter for screening for malignant neoplasm of colon: Secondary | ICD-10-CM | POA: Diagnosis not present

## 2019-06-03 LAB — HM COLONOSCOPY

## 2019-07-01 ENCOUNTER — Other Ambulatory Visit: Payer: Self-pay | Admitting: Internal Medicine

## 2019-07-20 ENCOUNTER — Other Ambulatory Visit: Payer: Self-pay | Admitting: Internal Medicine

## 2019-07-21 ENCOUNTER — Ambulatory Visit (INDEPENDENT_AMBULATORY_CARE_PROVIDER_SITE_OTHER): Payer: Medicare HMO

## 2019-07-21 ENCOUNTER — Other Ambulatory Visit: Payer: Self-pay

## 2019-07-21 DIAGNOSIS — Z23 Encounter for immunization: Secondary | ICD-10-CM | POA: Diagnosis not present

## 2019-09-26 DIAGNOSIS — Z87891 Personal history of nicotine dependence: Secondary | ICD-10-CM | POA: Diagnosis not present

## 2019-09-26 DIAGNOSIS — I10 Essential (primary) hypertension: Secondary | ICD-10-CM | POA: Diagnosis not present

## 2019-09-26 DIAGNOSIS — E7800 Pure hypercholesterolemia, unspecified: Secondary | ICD-10-CM | POA: Diagnosis not present

## 2019-09-26 DIAGNOSIS — Z7982 Long term (current) use of aspirin: Secondary | ICD-10-CM | POA: Diagnosis not present

## 2019-09-26 DIAGNOSIS — Z880 Allergy status to penicillin: Secondary | ICD-10-CM | POA: Diagnosis not present

## 2019-09-26 DIAGNOSIS — Z91041 Radiographic dye allergy status: Secondary | ICD-10-CM | POA: Diagnosis not present

## 2019-10-03 DIAGNOSIS — H25013 Cortical age-related cataract, bilateral: Secondary | ICD-10-CM | POA: Diagnosis not present

## 2019-10-03 DIAGNOSIS — H2513 Age-related nuclear cataract, bilateral: Secondary | ICD-10-CM | POA: Diagnosis not present

## 2019-10-03 DIAGNOSIS — H43392 Other vitreous opacities, left eye: Secondary | ICD-10-CM | POA: Diagnosis not present

## 2019-10-03 DIAGNOSIS — H43812 Vitreous degeneration, left eye: Secondary | ICD-10-CM | POA: Diagnosis not present

## 2019-10-07 DIAGNOSIS — Z124 Encounter for screening for malignant neoplasm of cervix: Secondary | ICD-10-CM | POA: Diagnosis not present

## 2019-10-07 DIAGNOSIS — Z1231 Encounter for screening mammogram for malignant neoplasm of breast: Secondary | ICD-10-CM | POA: Diagnosis not present

## 2019-10-07 DIAGNOSIS — Z01419 Encounter for gynecological examination (general) (routine) without abnormal findings: Secondary | ICD-10-CM | POA: Diagnosis not present

## 2019-10-07 DIAGNOSIS — N8111 Cystocele, midline: Secondary | ICD-10-CM | POA: Diagnosis not present

## 2019-10-07 DIAGNOSIS — N816 Rectocele: Secondary | ICD-10-CM | POA: Diagnosis not present

## 2019-10-07 LAB — HM MAMMOGRAPHY

## 2019-10-13 ENCOUNTER — Other Ambulatory Visit: Payer: Self-pay

## 2019-10-13 ENCOUNTER — Other Ambulatory Visit: Payer: Medicare HMO

## 2019-10-13 DIAGNOSIS — E538 Deficiency of other specified B group vitamins: Secondary | ICD-10-CM | POA: Diagnosis not present

## 2019-10-13 DIAGNOSIS — I10 Essential (primary) hypertension: Secondary | ICD-10-CM | POA: Diagnosis not present

## 2019-10-13 DIAGNOSIS — R739 Hyperglycemia, unspecified: Secondary | ICD-10-CM | POA: Diagnosis not present

## 2019-10-13 DIAGNOSIS — E7800 Pure hypercholesterolemia, unspecified: Secondary | ICD-10-CM | POA: Diagnosis not present

## 2019-10-14 LAB — COMPLETE METABOLIC PANEL WITH GFR
AG Ratio: 1.4 (calc) (ref 1.0–2.5)
ALT: 11 U/L (ref 6–29)
AST: 15 U/L (ref 10–35)
Albumin: 4.2 g/dL (ref 3.6–5.1)
Alkaline phosphatase (APISO): 70 U/L (ref 37–153)
BUN: 18 mg/dL (ref 7–25)
CO2: 29 mmol/L (ref 20–32)
Calcium: 9.7 mg/dL (ref 8.6–10.4)
Chloride: 102 mmol/L (ref 98–110)
Creat: 0.82 mg/dL (ref 0.60–0.93)
GFR, Est African American: 82 mL/min/{1.73_m2} (ref >=60)
GFR, Est Non African American: 71 mL/min/{1.73_m2} (ref >=60)
Globulin: 3.1 g/dL (calc) (ref 1.9–3.7)
Glucose, Bld: 101 mg/dL — ABNORMAL HIGH (ref 65–99)
Potassium: 4 mmol/L (ref 3.5–5.3)
Sodium: 140 mmol/L (ref 135–146)
Total Bilirubin: 0.5 mg/dL (ref 0.2–1.2)
Total Protein: 7.3 g/dL (ref 6.1–8.1)

## 2019-10-14 LAB — CBC WITH DIFFERENTIAL/PLATELET
Absolute Monocytes: 410 cells/uL (ref 200–950)
Basophils Absolute: 20 cells/uL (ref 0–200)
Basophils Relative: 0.3 %
Eosinophils Absolute: 130 cells/uL (ref 15–500)
Eosinophils Relative: 2 %
HCT: 40 % (ref 35.0–45.0)
Hemoglobin: 13.5 g/dL (ref 11.7–15.5)
Lymphs Abs: 3458 cells/uL (ref 850–3900)
MCH: 30.3 pg (ref 27.0–33.0)
MCHC: 33.8 g/dL (ref 32.0–36.0)
MCV: 89.7 fL (ref 80.0–100.0)
MPV: 9.9 fL (ref 7.5–12.5)
Monocytes Relative: 6.3 %
Neutro Abs: 2483 cells/uL (ref 1500–7800)
Neutrophils Relative %: 38.2 %
Platelets: 222 10*3/uL (ref 140–400)
RBC: 4.46 10*6/uL (ref 3.80–5.10)
RDW: 12.8 % (ref 11.0–15.0)
Total Lymphocyte: 53.2 %
WBC: 6.5 10*3/uL (ref 3.8–10.8)

## 2019-10-14 LAB — LIPID PANEL
Cholesterol: 182 mg/dL (ref <200)
HDL: 64 mg/dL (ref >=50)
LDL Cholesterol (Calc): 102 mg/dL (calc) — ABNORMAL HIGH
Non-HDL Cholesterol (Calc): 118 mg/dL (calc) (ref <130)
Total CHOL/HDL Ratio: 2.8 (calc) (ref <5.0)
Triglycerides: 72 mg/dL (ref <150)

## 2019-10-14 LAB — VITAMIN B12: Vitamin B-12: 1618 pg/mL — ABNORMAL HIGH (ref 200–1100)

## 2019-10-14 LAB — HEMOGLOBIN A1C
Hgb A1c MFr Bld: 5.8 % of total Hgb — ABNORMAL HIGH (ref <5.7)
Mean Plasma Glucose: 120 (calc)
eAG (mmol/L): 6.6 (calc)

## 2019-10-14 NOTE — Progress Notes (Signed)
B12 level is good. Blood counts are normal. Bad cholesterol has trended up from 86 to 102 with goal less than 100. sugar average has also trended back up to 5.8 from 5.7 in prediabetic range. Liver, kidneys and electrolytes are all ok.

## 2019-10-16 ENCOUNTER — Encounter: Payer: Self-pay | Admitting: Internal Medicine

## 2019-10-16 ENCOUNTER — Other Ambulatory Visit: Payer: Self-pay

## 2019-10-16 ENCOUNTER — Ambulatory Visit: Payer: Medicare HMO | Admitting: Internal Medicine

## 2019-10-16 VITALS — BP 114/68 | HR 87 | Temp 98.8°F | Ht 61.0 in | Wt 115.6 lb

## 2019-10-16 DIAGNOSIS — I10 Essential (primary) hypertension: Secondary | ICD-10-CM

## 2019-10-16 DIAGNOSIS — E7800 Pure hypercholesterolemia, unspecified: Secondary | ICD-10-CM

## 2019-10-16 DIAGNOSIS — R739 Hyperglycemia, unspecified: Secondary | ICD-10-CM

## 2019-10-16 DIAGNOSIS — E538 Deficiency of other specified B group vitamins: Secondary | ICD-10-CM | POA: Diagnosis not present

## 2019-10-16 NOTE — Progress Notes (Signed)
[facility]  Provider: Dr. Bufford Spikesiffany Reed   Goals of Care:  Advanced Directives 04/14/2019  Does Patient Have a Medical Advance Directive? No  Type of Estate agentAdvance Directive Healthcare Power of A[place]Attorney;Living will  Does patient want to make changes to medical advance directive? -  Copy of Healthcare Power of Attorney in Chart? Yes - validated most recent copy scanned in chart (See row information)  Would patient like information on creating a medical advance directive? No - Patient declined     No chief complaint on file.   HPI: Patient is a 73 y.o. female seen today for medical management of chronic diseases.    Lab results reviewed with patient.   Covid has changed her diet, she is baking more while she is at home. Admits to eating more sweets while at home. She limits fats in her diet and tries not to snack. She will either bake or broil her meats when preparing food. Drinks water and hot tea daily.   She states she has been doing well. Her husband had a shoulder replacement in October and she has been assisting in his recovery.   She has been avoiding crowds and not celebrating holidays due to covid.   Has seen her dentist within the last 6 months. Denies any dental issues.  Last eye exam December 2020. Denies any changes in vision.   Gynecology exam December 2020, mammogram done.   Had colonoscopy by Dr. Elnoria HowardHung in August 2020. No follow up colonoscopy needed unless symptoms occur.     Past Medical History:  Diagnosis Date  . Disorder of bone and cartilage, unspecified   . 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 Diverticulitis of colon (without mention of hemorrhage)(562.11)   . Essential hypertension, benign   . Ganglion of tendon sheath   . Lumbago   . Other abnormal blood chemistry   . Other and unspecified hyperlipidemia   . Premenopausal menorrhagia   . Sebaceous cyst   . Shortness of breath     Past Surgical History:   Procedure Laterality Date  . ROTATOR CUFF REPAIR Left 10/06/13   tendon and ligament repair    Allergies  Allergen Reactions  . Chocolate Diarrhea  . Iodine   . Penicillins   . Shellfish Allergy   . Statins Other (See Comments)    High LFT's  . Strawberry (Diagnostic) Itching    Outpatient Encounter Medications as of 10/16/2019  Medication Sig  . aspirin 81 MG tablet Take 81 mg by mouth daily.   . Biotin 1000 MCG tablet Take 1,000 mcg by mouth daily.  . calcium carbonate (CALCIUM 600) 1500 (600 Ca) MG TABS tablet Take 1 tablet by mouth 2 (two) times daily with a meal.  . cetirizine (ZYRTEC) 10 MG tablet Take 10 mg by mouth daily.   . cholecalciferol (VITAMIN D) 400 UNITS TABS tablet Take 400 Units by mouth daily.   . Glucosamine-Chondroit-Vit C-Mn (GLUCOSAMINE CHONDR 1500 COMPLX PO) Take by mouth daily.  Marland Kitchen. guaiFENesin (MUCINEX) 600 MG 12 hr tablet Take 600 mg by mouth as needed.   Marland Kitchen. lisinopril-hydrochlorothiazide (ZESTORETIC) 10-12.5 MG tablet TAKE 1 TABLET DAILY  . Multiple Vitamins-Minerals (MULTIVITAMIN WITH MINERALS) tablet Take 1 tablet by mouth daily.  . Probiotic Product (PROBIOTIC DAILY PO) Take 1 tablet by mouth daily.   . rosuvastatin (CRESTOR) 5 MG tablet TAKE ONE-HALF (1/2) TABLET AT BEDTIME ON MONDAY, WEDNESDAY, AND FRIDAY (AVOID GRAPEFRUIT PRODUCTS)  . vitamin B-12 (  CYANOCOBALAMIN) 500 MCG tablet Take 500 mcg by mouth daily.  . vitamin C (ASCORBIC ACID) 500 MG tablet Take 500 mg by mouth daily.   No facility-administered encounter medications on file as of 10/16/2019.    Review of Systems:  Review of Systems  Constitutional: Negative for activity change, appetite change and fatigue.  HENT: Negative for dental problem, hearing loss and trouble swallowing.   Eyes: Negative for photophobia and visual disturbance.  Respiratory: Negative for cough, shortness of breath and wheezing.   Cardiovascular: Negative for chest pain and palpitations.  Gastrointestinal:  Negative for abdominal pain, constipation, diarrhea and nausea.  Endocrine: Negative for polydipsia, polyphagia and polyuria.  Genitourinary: Negative for dysuria, frequency, hematuria and vaginal bleeding.  Musculoskeletal: Negative.   Skin: Negative.   Neurological: Negative for dizziness, weakness and headaches.  Psychiatric/Behavioral: Negative for dysphoric mood and sleep disturbance. The patient is not nervous/anxious.     Health Maintenance  Topic Date Due  . COLONOSCOPY  06/24/2019  . MAMMOGRAM  10/06/2021  . TETANUS/TDAP  11/08/2027  . INFLUENZA VACCINE  Completed  . DEXA SCAN  Completed  . Hepatitis C Screening  Completed  . PNA vac Low Risk Adult  Completed    Physical Exam: There were no vitals filed for this visit. There is no height or weight on file to calculate BMI. Physical Exam Vitals reviewed.  Constitutional:      General: She is not in acute distress.    Appearance: Normal appearance. She is normal weight.  HENT:     Head: Normocephalic.  Cardiovascular:     Rate and Rhythm: Normal rate and regular rhythm.     Pulses: Normal pulses.     Heart sounds: Normal heart sounds. No murmur.  Pulmonary:     Effort: Pulmonary effort is normal. No respiratory distress.     Breath sounds: Normal breath sounds. No wheezing.  Abdominal:     General: Abdomen is flat. Bowel sounds are normal.     Palpations: Abdomen is soft.  Musculoskeletal:        General: Normal range of motion.     Right lower leg: No edema.     Left lower leg: No edema.  Skin:    General: Skin is warm and dry.     Capillary Refill: Capillary refill takes less than 2 seconds.  Neurological:     General: No focal deficit present.     Mental Status: She is alert and oriented to person, place, and time.  Psychiatric:        Mood and Affect: Mood normal.        Behavior: Behavior normal.        Thought Content: Thought content normal.        Judgment: Judgment normal.     Labs reviewed:  Basic Metabolic Panel: Recent Labs    04/09/19 0842 10/13/19 0839  NA 141 140  K 3.9 4.0  CL 105 102  CO2 30 29  GLUCOSE 94 101*  BUN 14 18  CREATININE 0.89 0.82  CALCIUM 9.4 9.7   Liver Function Tests: Recent Labs    04/09/19 0842 10/13/19 0839  AST 13 15  ALT 8 11  BILITOT 0.3 0.5  PROT 6.7 7.3   No results for input(s): LIPASE, AMYLASE in the last 8760 hours. No results for input(s): AMMONIA in the last 8760 hours. CBC: Recent Labs    04/09/19 0842 10/13/19 0839  WBC 6.4 6.5  NEUTROABS 2,502 2,483  HGB 13.1  13.5  HCT 38.5 40.0  MCV 90.6 89.7  PLT 233 222   Lipid Panel: Recent Labs    04/09/19 0842 10/13/19 0839  CHOL 165 182  HDL 59 64  LDLCALC 86 102*  TRIG 103 72  CHOLHDL 2.8 2.8   Lab Results  Component Value Date   HGBA1C 5.8 (H) 10/13/2019    Procedures since last visit: No results found.  Assessment/Plan 1. Hyperglycemia - she is baking more at home, and having dessert more than two times a week - recommend limiting sweets and carbs - hemoglobin A1C- future - complete metabolic panel with GFR- future  2. Pure hypercholesterolemia - LDL 102, not at goal of <100 - recommend low fat diet and avoiding fried foods - continue current crestor regimen - lipid panel- future   3. Essential hypertension, benign - bp at goal - continue current medication regimen - complete blood count with differential /platelets- future  4. B12 deficiency - stable at this time   Labs/tests ordered: complete blood count with differential/platelets, complete metabolic panel with GFR, hemoglobin A1C, lipid panel- future  Next appt:  04/09/2020

## 2019-12-08 ENCOUNTER — Ambulatory Visit: Payer: Medicare HMO

## 2020-01-16 ENCOUNTER — Other Ambulatory Visit: Payer: Self-pay | Admitting: Internal Medicine

## 2020-03-09 ENCOUNTER — Telehealth: Payer: Self-pay | Admitting: *Deleted

## 2020-03-09 NOTE — Telephone Encounter (Signed)
error 

## 2020-04-09 ENCOUNTER — Ambulatory Visit (INDEPENDENT_AMBULATORY_CARE_PROVIDER_SITE_OTHER): Payer: Medicare HMO | Admitting: Family

## 2020-04-09 ENCOUNTER — Encounter: Payer: Self-pay | Admitting: Family

## 2020-04-09 ENCOUNTER — Other Ambulatory Visit: Payer: Medicare HMO

## 2020-04-09 ENCOUNTER — Other Ambulatory Visit: Payer: Self-pay

## 2020-04-09 DIAGNOSIS — I10 Essential (primary) hypertension: Secondary | ICD-10-CM

## 2020-04-09 DIAGNOSIS — K5790 Diverticulosis of intestine, part unspecified, without perforation or abscess without bleeding: Secondary | ICD-10-CM

## 2020-04-09 DIAGNOSIS — E7800 Pure hypercholesterolemia, unspecified: Secondary | ICD-10-CM

## 2020-04-09 DIAGNOSIS — Z0000 Encounter for general adult medical examination without abnormal findings: Secondary | ICD-10-CM | POA: Diagnosis not present

## 2020-04-09 DIAGNOSIS — R739 Hyperglycemia, unspecified: Secondary | ICD-10-CM

## 2020-04-09 NOTE — Progress Notes (Signed)
    This service is provided via telemedicine  No vital signs collected/recorded due to the encounter was a telemedicine visit.   Location of patient (ex: home, work): Home.  Patient consents to a telephone visit: Yes.  Location of the provider (ex: office, home):  [facility].  Name of any referring provider: N/A  Names of all persons participating in the telemedicine service and their role in the encounter:  Patient, Derwood Becraft, RMA, Ngetich, Dinah, NP.    Time spent on call: 8 minutes spent on the phone with Medical Assistant.   

## 2020-04-09 NOTE — Patient Instructions (Addendum)
[name] , Thank you for taking time to come for your Medicare Wellness Visit. I appreciate your ongoing commitment to your health goals. Please review the following plan we discussed and let me know if I can assist you in the future.   Screening recommendations/referrals: Colonoscopy: Up to date  Mammogram  Up to date  Bone Density  Up to date  Recommended yearly ophthalmology/optometry visit for glaucoma screening and checkup Recommended yearly dental visit for hygiene and checkup  Vaccinations: Influenza vaccine  Up to date  Pneumococcal vaccine  Up to date  Tdap vaccine  Up to date  Shingles vaccine  Up to date     Advanced directives:Yes   Conditions/risks identified: Advance Age female > 61 yrs,Hypertension,Hx of smoking  Next appointment: 1 year    Preventive Care 9 Years and Older, Female Preventive care refers to lifestyle choices and visits with your health care provider that can promote health and wellness. What does preventive care include?  A yearly physical exam. This is also called an annual well check.  Dental exams once or twice a year.  Routine eye exams. Ask your health care provider how often you should have your eyes checked.  Personal lifestyle choices, including:  Daily care of your teeth and gums.  Regular physical activity.  Eating a healthy diet.  Avoiding tobacco and drug use.  Limiting alcohol use.  Practicing safe sex.  Taking low-dose aspirin every day.  Taking vitamin and mineral supplements as recommended by your health care provider. What happens during an annual well check? The services and screenings done by your health care provider during your annual well check will depend on your age, overall health, lifestyle risk factors, and family history of disease. Counseling  Your health care provider may ask you questions about your:  Alcohol use.  Tobacco use.  Drug use.  Emotional well-being.  Home and relationship  well-being.  Sexual activity.  Eating habits.  History of falls.  Memory and ability to understand (cognition).  Work and work Statistician.  Reproductive health. Screening  You may have the following tests or measurements:  Height, weight, and BMI.  Blood pressure.  Lipid and cholesterol levels. These may be checked every 5 years, or more frequently if you are over 44 years old.  Skin check.  Lung cancer screening. You may have this screening every year starting at age 50 if you have a 30-pack-year history of smoking and currently smoke or have quit within the past 15 years.  Fecal occult blood test (FOBT) of the stool. You may have this test every year starting at age 59.  Flexible sigmoidoscopy or colonoscopy. You may have a sigmoidoscopy every 5 years or a colonoscopy every 10 years starting at age 31.  Hepatitis C blood test.  Hepatitis B blood test.  Sexually transmitted disease (STD) testing.  Diabetes screening. This is done by checking your blood sugar (glucose) after you have not eaten for a while (fasting). You may have this done every 1-3 years.  Bone density scan. This is done to screen for osteoporosis. You may have this done starting at age 40.  Mammogram. This may be done every 1-2 years. Talk to your health care provider about how often you should have regular mammograms. Talk with your health care provider about your test results, treatment options, and if necessary, the need for more tests. Vaccines  Your health care provider may recommend certain vaccines, such as:  Influenza vaccine. This is recommended every  year.  Tetanus, diphtheria, and acellular pertussis (Tdap, Td) vaccine. You may need a Td booster every 10 years.  Zoster vaccine. You may need this after age 60.  Pneumococcal 13-valent conjugate (PCV13) vaccine. One dose is recommended after age 41.  Pneumococcal polysaccharide (PPSV23) vaccine. One dose is recommended after age  83. Talk to your health care provider about which screenings and vaccines you need and how often you need them. This information is not intended to replace advice given to you by your health care provider. Make sure you discuss any questions you have with your health care provider. Document Released: 11/12/2015 Document Revised: 07/05/2016 Document Reviewed: 08/17/2015 Elsevier Interactive Patient Education  2017 [place] Prevention in the Home Falls can cause injuries. They can happen to people of all ages. There are many things you can do to make your home safe and to help prevent falls. What can I do on the outside of my home?  Regularly fix the edges of walkways and driveways and fix any cracks.  Remove anything that might make you trip as you walk through a door, such as a raised step or threshold.  Trim any bushes or trees on the path to your home.  Use bright outdoor lighting.  Clear any walking paths of anything that might make someone trip, such as rocks or tools.  Regularly check to see if handrails are loose or broken. Make sure that both sides of any steps have handrails.  Any raised decks and porches should have guardrails on the edges.  Have any leaves, snow, or ice cleared regularly.  Use sand or salt on walking paths during winter.  Clean up any spills in your garage right away. This includes oil or grease spills. What can I do in the bathroom?  Use night lights.  Install grab bars by the toilet and in the tub and shower. Do not use towel bars as grab bars.  Use non-skid mats or decals in the tub or shower.  If you need to sit down in the shower, use a plastic, non-slip stool.  Keep the floor dry. Clean up any water that spills on the floor as soon as it happens.  Remove soap buildup in the tub or shower regularly.  Attach bath mats securely with double-sided non-slip rug tape.  Do not have throw rugs and other things on the floor that can make  you trip. What can I do in the bedroom?  Use night lights.  Make sure that you have a light by your bed that is easy to reach.  Do not use any sheets or blankets that are too big for your bed. They should not hang down onto the floor.  Have a firm chair that has side arms. You can use this for support while you get dressed.  Do not have throw rugs and other things on the floor that can make you trip. What can I do in the kitchen?  Clean up any spills right away.  Avoid walking on wet floors.  Keep items that you use a lot in easy-to-reach places.  If you need to reach something above you, use a strong step stool that has a grab bar.  Keep electrical cords out of the way.  Do not use floor polish or wax that makes floors slippery. If you must use wax, use non-skid floor wax.  Do not have throw rugs and other things on the floor that can make you trip. What can  I do with my stairs?  Do not leave any items on the stairs.  Make sure that there are handrails on both sides of the stairs and use them. Fix handrails that are broken or loose. Make sure that handrails are as long as the stairways.  Check any carpeting to make sure that it is firmly attached to the stairs. Fix any carpet that is loose or worn.  Avoid having throw rugs at the top or bottom of the stairs. If you do have throw rugs, attach them to the floor with carpet tape.  Make sure that you have a light switch at the top of the stairs and the bottom of the stairs. If you do not have them, ask someone to add them for you. What else can I do to help prevent falls?  Wear shoes that:  Do not have high heels.  Have rubber bottoms.  Are comfortable and fit you well.  Are closed at the toe. Do not wear sandals.  If you use a stepladder:  Make sure that it is fully opened. Do not climb a closed stepladder.  Make sure that both sides of the stepladder are locked into place.  Ask someone to hold it for you, if  possible.  Clearly mark and make sure that you can see:  Any grab bars or handrails.  First and last steps.  Where the edge of each step is.  Use tools that help you move around (mobility aids) if they are needed. These include:  Canes.  Walkers.  Scooters.  Crutches.  Turn on the lights when you go into a dark area. Replace any light bulbs as soon as they burn out.  Set up your furniture so you have a clear path. Avoid moving your furniture around.  If any of your floors are uneven, fix them.  If there are any pets around you, be aware of where they are.  Review your medicines with your doctor. Some medicines can make you feel dizzy. This can increase your chance of falling. Ask your doctor what other things that you can do to help prevent falls. This information is not intended to replace advice given to you by your health care provider. Make sure you discuss any questions you have with your health care provider. Document Released: 08/12/2009 Document Revised: 03/23/2016 Document Reviewed: 11/20/2014 Elsevier Interactive Patient Education  2017 [employer].

## 2020-04-09 NOTE — Progress Notes (Signed)
Subjective:   Tamara Davidson is a 74 y.o. female who presents for Medicare Annual (Subsequent) preventive examination.  Review of Systems:   Cardiac Risk Factors include: advanced age (>56men, >65 women);hypertension;smoking/ tobacco exposure;dyslipidemia     Objective:     Vitals: There were no vitals taken for this visit.  There is no height or weight on file to calculate BMI.  Advanced Directives 04/09/2020 04/14/2019 04/09/2019 04/11/2018 04/08/2018 08/06/2017 03/30/2017  Does Patient Have a Medical Advance Directive? Yes No Yes Yes Yes Yes Yes  Type of Advance Directive Living will;Healthcare Power of [employer] Power of [place];Living will Living will Healthcare Power of [place];Living will Healthcare Power of [place];Living will Healthcare Power of [employer] of [place];Living will  Does patient want to make changes to medical advance directive? No - Patient declined - - No - Patient declined No - Patient declined - -  Copy of Healthcare Power of Attorney in Chart? Yes - validated most recent copy scanned in chart (See row information) Yes - validated most recent copy scanned in chart (See row information) - Yes Yes Yes Yes  Would patient like information on creating a medical advance directive? - No - Patient declined - - - - -    Tobacco Social History   Tobacco Use  Smoking Status Former Smoker  . Packs/day: 1.00  . Years: 5.00  . Pack years: 5.00  . Start date: 10/30/1969  Smokeless Tobacco Never Used     Counseling given: Not Answered   Clinical Intake:  Pre-visit preparation completed: No  Pain : 0-10 Pain Score: 2  Pain Type: Acute pain Pain Location: Finger (Comment which one) Pain Orientation: Right Pain Radiating Towards: no Pain Descriptors / Indicators: Aching Pain Onset: Other (comment) (one month) Pain Frequency: Constant Pain Relieving Factors: Aleve Effect of Pain on Daily Activities: gripping sometimes  Pain Relieving  Factors: Aleve  BMI - recorded: 21.84  How often do you need to have someone help you when you read instructions, pamphlets, or other written materials from your doctor or pharmacy?: 1 - Never What is the last grade level you completed in school?: Bachelors Degree  Interpreter Needed?: No  Information entered by :: Melford Tullier FNP-C  Past Medical History:  Diagnosis Date  . Disorder of bone and cartilage, unspecified   . 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 Diverticulitis of colon (without mention of hemorrhage)(562.11)   . Essential hypertension, benign   . Ganglion of tendon sheath   . Lumbago   . Other abnormal blood chemistry   . Other and unspecified hyperlipidemia   . Premenopausal menorrhagia   . Sebaceous cyst   . Shortness of breath    Past Surgical History:  Procedure Laterality Date  . ROTATOR CUFF REPAIR Left 10/06/13   tendon and ligament repair   Family History  Problem Relation Age of Onset  . Lung cancer Mother   . Osteoporosis Mother   . Coronary artery disease Father    Social History   Socioeconomic History  . Marital status: Married    Spouse name: Not on file  . Number of children: Not on file  . Years of education: Not on file  . Highest education level: Not on file  Occupational History  . Not on file  Tobacco Use  . Smoking status: Former Smoker    Packs/day: 1.00    Years: 5.00    Pack years: 5.00    Start date:  10/30/1969  . Smokeless tobacco: Never Used  Vaping Use  . Vaping Use: Never used  Substance and Sexual Activity  . Alcohol use: No    Alcohol/week: 0.0 standard drinks  . Drug use: No  . Sexual activity: Not on file  Other Topics Concern  . Not on file  Social History Narrative   Married 23 years, with Broadus John for 29 years   Social Determinants of Radio broadcast assistant Strain:   . Difficulty of Paying Living Expenses:   Food Insecurity:   . Worried About Charity fundraiser in  the Last Year:   . Arboriculturist in the Last Year:   Transportation Needs:   . Film/video editor (Medical):   Marland Kitchen Lack of Transportation (Non-Medical):   Physical Activity:   . Days of Exercise per Week:   . Minutes of Exercise per Session:   Stress:   . Feeling of Stress :   Social Connections:   . Frequency of Communication with Friends and Family:   . Frequency of Social Gatherings with Friends and Family:   . Attends Religious Services:   . Active Member of Clubs or Organizations:   . Attends Archivist Meetings:   Marland Kitchen Marital Status:     Outpatient Encounter Medications as of 04/09/2020  Medication Sig  . aspirin 81 MG tablet Take 81 mg by mouth daily.   . Biotin 1000 MCG tablet Take 1,000 mcg by mouth daily.  . calcium carbonate (CALCIUM 600) 1500 (600 Ca) MG TABS tablet Take 1 tablet by mouth 2 (two) times daily with a meal.  . cholecalciferol (VITAMIN D) 400 UNITS TABS tablet Take 400 Units by mouth daily.   . fluticasone (FLONASE) 50 MCG/ACT nasal spray Place 2 sprays into both nostrils daily.  . Glucosamine-Chondroit-Vit C-Mn (GLUCOSAMINE CHONDR 1500 COMPLX PO) Take by mouth daily.  Marland Kitchen guaiFENesin (MUCINEX) 600 MG 12 hr tablet Take 600 mg by mouth daily.   Marland Kitchen lisinopril-hydrochlorothiazide (ZESTORETIC) 10-12.5 MG tablet TAKE 1 TABLET DAILY  . Multiple Vitamins-Minerals (MULTIVITAMIN WITH MINERALS) tablet Take 1 tablet by mouth daily.  . Probiotic Product (PROBIOTIC DAILY PO) Take 1 tablet by mouth daily.   . rosuvastatin (CRESTOR) 5 MG tablet TAKE ONE-HALF (1/2) TABLET AT BEDTIME ON MONDAY, WEDNESDAY, AND FRIDAY (AVOID GRAPEFRUIT PRODUCTS)  . vitamin B-12 (CYANOCOBALAMIN) 500 MCG tablet Take 500 mcg by mouth daily.  . vitamin C (ASCORBIC ACID) 500 MG tablet Take 500 mg by mouth daily.  . [DISCONTINUED] cetirizine (ZYRTEC) 10 MG tablet Take 10 mg by mouth daily.    No facility-administered encounter medications on file as of 04/09/2020.    Activities of Daily  Living In your present state of health, do you have any difficulty performing the following activities: 04/09/2020  Hearing? N  Vision? N  Difficulty concentrating or making decisions? N  Walking or climbing stairs? N  Dressing or bathing? N  Doing errands, shopping? N  Preparing Food and eating ? N  Using the Toilet? N  In the past six months, have you accidently leaked urine? N  Do you have problems with loss of bowel control? N  Managing your Medications? N  Managing your Finances? N  Housekeeping or managing your Housekeeping? N  Some recent data might be hidden    Patient Care Team: Gayland Curry, DO as PCP - General (Geriatric Medicine) Netta Cedars, MD as Consulting Physician (Orthopedic Surgery) Monna Fam, MD as Consulting Physician (Ophthalmology)  Assessment:   This is a routine wellness examination for Kaiyana.  Exercise Activities and Dietary recommendations Current Exercise Habits: Structured exercise class, Type of exercise: walking;yoga;Other - see comments ([place] chi), Time (Minutes): 45, Frequency (Times/Week): 2, Weekly Exercise (Minutes/Week): 90, Intensity: Moderate, Exercise limited by: None identified  Goals    . Lose Belly Fat     Starting today pt will increase gym days to 4 days a week.        Fall Risk Fall Risk  04/09/2020 10/16/2019 04/14/2019 04/09/2019 10/14/2018  Falls in the past year? 0 0 0 0 0  Number falls in past yr: 0 0 0 0 0  Injury with Fall? 0 - 0 0 0  Comment - - - - -   Is the patient's home Davidson of loose throw rugs in walkways, pet beds, electrical cords, etc?   no      Grab bars in the bathroom? yes      Handrails on the stairs?   no      Adequate lighting?   yes  Depression Screen PHQ 2/9 Scores 04/09/2020 10/16/2019 04/14/2019 04/09/2019  PHQ - 2 Score 0 0 0 0     Cognitive Function MMSE - Mini Mental State Exam 04/09/2019 04/08/2018 03/21/2017 02/10/2016 07/13/2014  Orientation to time 5 5 5 5 5   Orientation to  Place 5 5 5 5 5   Registration 3 3 3 3 3   Attention/ Calculation 5 5 5 5 5   Recall 3 3 3 3 2   Language- name 2 objects 2 2 2 2 2   Language- repeat 1 1 1 1 1   Language- follow 3 step command 3 3 3 3 3   Language- read & follow direction 1 1 1 1 1   Write a sentence 1 1 1 1 1   Copy design 1 1 1 1 1   Total score 30 30 30 30 29      6CIT Screen 04/09/2020  What Year? 0 points  What month? 0 points  What time? 0 points  Count back from 20 0 points  Months in reverse 0 points  Repeat phrase 2 points  Total Score 2    Immunization History  Administered Date(s) Administered  . Fluad Quad(high Dose 65+) 07/21/2019  . Influenza Whole 07/28/2010, 07/11/2012  . Influenza, High Dose Seasonal PF 07/12/2017, 07/22/2018  . Influenza,inj,Quad PF,6+ Mos 07/13/2014, 08/12/2015, 06/15/2016  . Influenza-Unspecified 07/30/2013  . PFIZER SARS-COV-2 Vaccination 11/21/2019, 12/12/2019  . Pneumococcal Conjugate-13 10/08/2014  . Pneumococcal Polysaccharide-23 12/06/2010  . Tdap 10/15/2007, 11/07/2017  . Zoster 01/25/2013  . Zoster Recombinat (Shingrix) 09/26/2017, 11/30/2017    Qualifies for Shingles Vaccine? Up to date   Screening Tests Health Maintenance  Topic Date Due  . INFLUENZA VACCINE  05/30/2020  . MAMMOGRAM  10/06/2021  . TETANUS/TDAP  11/08/2027  . COLONOSCOPY  06/02/2029  . DEXA SCAN  Completed  . COVID-19 Vaccine  Completed  . Hepatitis C Screening  Completed  . PNA vac Low Risk Adult  Completed    Cancer Screenings: Lung: Low Dose CT Chest recommended if Age 59-80 years, 30 pack-year currently smoking OR have quit w/in 15years. Patient does not qualify. Breast:  Up to date on Mammogram? Yes   Up to date of Bone Density/Dexa? Yes Colorectal:Up to date   Additional Screenings: Hepatitis C Screening:  Completed      Plan:   I have personally reviewed and noted the following in the patient's chart:   . Medical and social history . Use  of alcohol, tobacco or illicit drugs   . Current medications and supplements . Functional ability and status . Nutritional status . Physical activity . Advanced directives . List of other physicians . Hospitalizations, surgeries, and ER visits in previous 12 months . Vitals . Screenings to include cognitive, depression, and falls . Referrals and appointments  In addition, I have reviewed and discussed with patient certain preventive protocols, quality metrics, and best practice recommendations. A written personalized care plan for preventive services as well as general preventive health recommendations were provided to patient.     Caesar Bookman, NP  04/09/2020

## 2020-04-12 NOTE — Progress Notes (Signed)
Blood counts, kidneys and electrolytes are normal. Sugar average is in the normal range. Bad cholesterol is back down under goal of less than 100 (97)

## 2020-04-15 ENCOUNTER — Other Ambulatory Visit: Payer: Self-pay

## 2020-04-15 ENCOUNTER — Ambulatory Visit (INDEPENDENT_AMBULATORY_CARE_PROVIDER_SITE_OTHER): Payer: Medicare HMO | Admitting: Internal Medicine

## 2020-04-15 ENCOUNTER — Encounter: Payer: Self-pay | Admitting: Internal Medicine

## 2020-04-15 ENCOUNTER — Ambulatory Visit
Admission: RE | Admit: 2020-04-15 | Discharge: 2020-04-15 | Disposition: A | Discharge status: Home / Self Care | Payer: Medicare HMO | Source: Ambulatory Visit | Attending: Internal Medicine | Admitting: Internal Medicine

## 2020-04-15 VITALS — BP 110/62 | HR 87 | Temp 97.9°F | Ht 61.0 in | Wt 115.0 lb

## 2020-04-15 DIAGNOSIS — I10 Essential (primary) hypertension: Secondary | ICD-10-CM

## 2020-04-15 DIAGNOSIS — R2232 Localized swelling, mass and lump, left upper limb: Secondary | ICD-10-CM | POA: Diagnosis not present

## 2020-04-15 DIAGNOSIS — R739 Hyperglycemia, unspecified: Secondary | ICD-10-CM | POA: Diagnosis not present

## 2020-04-15 DIAGNOSIS — E7800 Pure hypercholesterolemia, unspecified: Secondary | ICD-10-CM

## 2020-04-15 DIAGNOSIS — I700 Atherosclerosis of aorta: Secondary | ICD-10-CM

## 2020-04-15 LAB — COMPLETE METABOLIC PANEL WITH GFR
AG Ratio: 1.4 (calc) (ref 1.0–2.5)
ALT: 10 U/L (ref 6–29)
AST: 14 U/L (ref 10–35)
Albumin: 4.3 g/dL (ref 3.6–5.1)
Alkaline phosphatase (APISO): 80 U/L (ref 37–153)
BUN: 18 mg/dL (ref 7–25)
CO2: 28 mmol/L (ref 20–32)
Calcium: 9.5 mg/dL (ref 8.6–10.4)
Chloride: 103 mmol/L (ref 98–110)
Creat: 0.87 mg/dL (ref 0.60–0.93)
GFR, Est African American: 76 mL/min/{1.73_m2} (ref >=60)
GFR, Est Non African American: 66 mL/min/{1.73_m2} (ref >=60)
Globulin: 3 g/dL (calc) (ref 1.9–3.7)
Glucose, Bld: 101 mg/dL — ABNORMAL HIGH (ref 65–99)
Potassium: 3.7 mmol/L (ref 3.5–5.3)
Sodium: 140 mmol/L (ref 135–146)
Total Bilirubin: 0.4 mg/dL (ref 0.2–1.2)
Total Protein: 7.3 g/dL (ref 6.1–8.1)

## 2020-04-15 LAB — LIPID PANEL
Cholesterol: 179 mg/dL (ref <200)
HDL: 65 mg/dL (ref >=50)
LDL Cholesterol (Calc): 97 mg/dL (calc)
Non-HDL Cholesterol (Calc): 114 mg/dL (calc) (ref <130)
Total CHOL/HDL Ratio: 2.8 (calc) (ref <5.0)
Triglycerides: 77 mg/dL (ref <150)

## 2020-04-15 LAB — TEST AUTHORIZATION

## 2020-04-15 LAB — CBC WITH DIFFERENTIAL/PLATELET
Absolute Monocytes: 350 cells/uL (ref 200–950)
Basophils Absolute: 32 cells/uL (ref 0–200)
Basophils Relative: 0.6 %
Eosinophils Absolute: 90 cells/uL (ref 15–500)
Eosinophils Relative: 1.7 %
HCT: 40.2 % (ref 35.0–45.0)
Hemoglobin: 13.6 g/dL (ref 11.7–15.5)
Lymphs Abs: 2645 cells/uL (ref 850–3900)
MCH: 30.6 pg (ref 27.0–33.0)
MCHC: 33.8 g/dL (ref 32.0–36.0)
MCV: 90.3 fL (ref 80.0–100.0)
MPV: 10 fL (ref 7.5–12.5)
Monocytes Relative: 6.6 %
Neutro Abs: 2184 cells/uL (ref 1500–7800)
Neutrophils Relative %: 41.2 %
Platelets: 232 10*3/uL (ref 140–400)
RBC: 4.45 10*6/uL (ref 3.80–5.10)
RDW: 13.4 % (ref 11.0–15.0)
Total Lymphocyte: 49.9 %
WBC: 5.3 10*3/uL (ref 3.8–10.8)

## 2020-04-15 LAB — HEMOGLOBIN A1C
Hgb A1c MFr Bld: 5.6 % of total Hgb (ref <5.7)
Mean Plasma Glucose: 114 (calc)
eAG (mmol/L): 6.3 (calc)

## 2020-04-15 LAB — URIC ACID: Uric Acid, Serum: 5.1 mg/dL (ref 2.5–7.0)

## 2020-04-15 NOTE — Progress Notes (Signed)
Location:  [facility] clinic Provider:  Thera Basden L. Renato Gails, D.O., C.M.D.  Code Status: DNR Goals of Care:  Advanced Directives 04/15/2020  Does Patient Have a Medical Advance Directive? Yes  Type of Advance Directive Living will;Out of facility DNR (pink MOST or yellow form)  Does patient want to make changes to medical advance directive? No - Patient declined  Copy of Healthcare Power of Attorney in Chart? Yes - validated most recent copy scanned in chart (See row information)  Would patient like information on creating a medical advance directive? -  Pre-existing out of facility DNR order (yellow form or pink MOST form) Pink MOST/Yellow Form most recent copy in chart - Physician notified to receive inpatient order   Chief Complaint  Patient presents with  . Medical Management of Chronic Issues    6 month follow up    HPI: Patient is a 74 y.o. female seen today for medical management of chronic diseases.    She is good overall.    Two fingers on left hand for the past 6 wks.  Joint in middle finger on left had enlargement of joint and lump on side, was painful in joint.  Little lump is almost flat.  If she presses on it, she feels discomfort and now it's happening to index finger.  Not red or warm.    They sail sept 25th for their next cruise and she needs covid test before that and asking if it can be done here as rapid test.    Reviewed labs which are good.  Cholesterol is back down under goal.  [facility] are [facility] [employer] [facility].  She also put in her veggie garden which keeps her moving.    Past Medical History:  Diagnosis Date  . Disorder of bone and cartilage, unspecified   . 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 Diverticulitis of colon (without mention of hemorrhage)(562.11)   . Essential hypertension, benign   . Ganglion of tendon sheath   . Lumbago   . Other abnormal blood chemistry   . Other and unspecified hyperlipidemia   .  Premenopausal menorrhagia   . Sebaceous cyst   . Shortness of breath     Past Surgical History:  Procedure Laterality Date  . ROTATOR CUFF REPAIR Left 10/06/13   tendon and ligament repair    Allergies  Allergen Reactions  . Chocolate Diarrhea  . Iodine   . Penicillins   . Shellfish Allergy   . Statins Other (See Comments)    High LFT's  . Strawberry (Diagnostic) Itching    Outpatient Encounter Medications as of 04/15/2020  Medication Sig  . aspirin 81 MG tablet Take 81 mg by mouth daily.   . Biotin 1000 MCG tablet Take 1,000 mcg by mouth daily.  . calcium carbonate (CALCIUM 600) 1500 (600 Ca) MG TABS tablet Take 1 tablet by mouth 2 (two) times daily with a meal.  . cholecalciferol (VITAMIN D) 400 UNITS TABS tablet Take 400 Units by mouth daily.   . fluticasone (FLONASE) 50 MCG/ACT nasal spray Place 2 sprays into both nostrils daily.  . Glucosamine-Chondroit-Vit C-Mn (GLUCOSAMINE CHONDR 1500 COMPLX PO) Take by mouth daily.  Marland Kitchen guaiFENesin (MUCINEX) 600 MG 12 hr tablet Take 600 mg by mouth daily.   Marland Kitchen lisinopril-hydrochlorothiazide (ZESTORETIC) 10-12.5 MG tablet TAKE 1 TABLET DAILY  . Multiple Vitamins-Minerals (MULTIVITAMIN WITH MINERALS) tablet Take 1 tablet by mouth daily.  . Probiotic Product (PROBIOTIC DAILY PO)  Take 1 tablet by mouth daily.   . rosuvastatin (CRESTOR) 5 MG tablet TAKE ONE-HALF (1/2) TABLET AT BEDTIME ON MONDAY, WEDNESDAY, AND FRIDAY (AVOID GRAPEFRUIT PRODUCTS)  . vitamin B-12 (CYANOCOBALAMIN) 500 MCG tablet Take 500 mcg by mouth daily.  . vitamin C (ASCORBIC ACID) 500 MG tablet Take 500 mg by mouth daily.   No facility-administered encounter medications on file as of 04/15/2020.    Review of Systems:  Review of Systems  Constitutional: Negative for chills, fever and malaise/fatigue.  HENT: Negative for congestion, hearing loss and sore throat.   Eyes: Negative for blurred vision.  Respiratory: Negative for cough and shortness of breath.   Cardiovascular:  Negative for chest pain, palpitations and leg swelling.  Gastrointestinal: Negative for abdominal pain, blood in stool, constipation, diarrhea and melena.  Genitourinary: Negative for dysuria.  Musculoskeletal: Positive for joint pain. Negative for back pain, falls and myalgias.  Skin: Negative for itching and rash.  Neurological: Negative for dizziness, tingling, sensory change and loss of consciousness.  Endo/Heme/Allergies: Does not bruise/bleed easily.  Psychiatric/Behavioral: Negative for depression and memory loss. The patient is not nervous/anxious and does not have insomnia.     Health Maintenance  Topic Date Due  . INFLUENZA VACCINE  05/30/2020  . MAMMOGRAM  10/06/2021  . TETANUS/TDAP  11/08/2027  . COLONOSCOPY  06/02/2029  . DEXA SCAN  Completed  . COVID-19 Vaccine  Completed  . Hepatitis C Screening  Completed  . PNA vac Low Risk Adult  Completed    Physical Exam: Vitals:   04/15/20 1107  BP: 110/62  Pulse: 87  Temp: 97.9 F (36.6 C)  TempSrc: Temporal  SpO2: 97%  Weight: 115 lb (52.2 kg)  Height: 5\' 1"  (1.549 m)   Body mass index is 21.73 kg/m. Physical Exam Vitals reviewed.  Constitutional:      General: She is not in acute distress.    Appearance: Normal appearance. She is normal weight. She is not toxic-appearing.  HENT:     Head: Normocephalic and atraumatic.  Cardiovascular:     Rate and Rhythm: Normal rate and regular rhythm.     Pulses: Normal pulses.     Heart sounds: Normal heart sounds. No murmur heard.  No friction rub. No gallop.   Pulmonary:     Effort: Pulmonary effort is normal.     Breath sounds: Normal breath sounds. No wheezing, rhonchi or rales.  Abdominal:     General: Bowel sounds are normal. There is no distension.  Musculoskeletal:        General: Tenderness present. Normal range of motion.     Comments: Of left 2nd and 3rd digits with cystic areas palpable on DIP of 3rd  Skin:    General: Skin is warm and dry.   Neurological:     General: No focal deficit present.     Mental Status: She is alert and oriented to person, place, and time.  Psychiatric:        Mood and Affect: Mood normal.        Behavior: Behavior normal.        Thought Content: Thought content normal.        Judgment: Judgment normal.     Labs reviewed: Basic Metabolic Panel: Recent Labs    10/13/19 0839 04/09/20 1033  NA 140 140  K 4.0 3.7  CL 102 103  CO2 29 28  GLUCOSE 101* 101*  BUN 18 18  CREATININE 0.82 0.87  CALCIUM 9.7 9.5   Liver  Function Tests: Recent Labs    10/13/19 0839 04/09/20 1033  AST 15 14  ALT 11 10  BILITOT 0.5 0.4  PROT 7.3 7.3   No results for input(s): LIPASE, AMYLASE in the last 8760 hours. No results for input(s): AMMONIA in the last 8760 hours. CBC: Recent Labs    10/13/19 0839 04/09/20 1033  WBC 6.5 5.3  NEUTROABS 2,483 2,184  HGB 13.5 13.6  HCT 40.0 40.2  MCV 89.7 90.3  PLT 222 232   Lipid Panel: Recent Labs    10/13/19 0839 04/09/20 1033  CHOL 182 179  HDL 64 65  LDLCALC 102* 97  TRIG 72 77  CHOLHDL 2.8 2.8   Lab Results  Component Value Date   HGBA1C 5.6 04/09/2020    Assessment/Plan 1. Nodule of finger of left hand -suspect osteoarthritis but r/o any rheumatoid nodules or erosions with imaging -tylenol, topicals if bothersome enough  - DG Hand Complete Left; Future - DG Hand Complete Right; Future - Uric Acid  2. Hyperglycemia - improved with return to gym, healthy eating habits - Hemoglobin A1c; Future  3. Essential hypertension, benign -bp at goal with current regimen--cont ace/hctz - CBC with Differential/Platelet; Future - COMPLETE METABOLIC PANEL WITH GFR; Future  4. Pure hypercholesterolemia -at goal with crestor 5mg  three days per week and her diet and exercise Lab Results  Component Value Date   LDLCALC 97 04/09/2020  - Lipid panel; Future - COMPLETE METABOLIC PANEL WITH GFR; Future  5.  Atherosclerosis of aortic arch (HCC) -cont  lipid and glucose mgt -may need to get LDL down even more due to this--will discuss with her at CPE  Labs/tests ordered:   Orders Placed This Encounter  Procedures  . DG Hand Complete Left    Standing Status:   Future    Number of Occurrences:   1    Standing Expiration Date:   04/15/2021    Order Specific Question:   Reason for Exam (SYMPTOM  OR DIAGNOSIS REQUIRED)    Answer:   new joint pain, nodules    Order Specific Question:   Preferred imaging location?    Answer:   [facility]    Order Specific Question:   Radiology Contrast Protocol - do NOT remove file path    Answer:   \\[email or web address]\DXFluoroContrastProtocols.pdf  . DG Hand Complete Right    Standing Status:   Future    Number of Occurrences:   1    Standing Expiration Date:   04/15/2021    Order Specific Question:   Reason for Exam (SYMPTOM  OR DIAGNOSIS REQUIRED)    Answer:   new joint nodules    Order Specific Question:   Preferred imaging location?    Answer:   [facility]    Order Specific Question:   Radiology Contrast Protocol - do NOT remove file path    Answer:   \\[email or web address]\[email or web address]\DXFluoroContrastProtocols.pdf  . Uric Acid  . CBC with Differential/Platelet    Standing Status:   Future    Standing Expiration Date:   04/15/2021  . Lipid panel    Standing Status:   Future    Standing Expiration Date:   04/15/2021    Order Specific Question:   Has the patient fasted?    Answer:   Yes  . Hemoglobin A1c    Standing Status:   Future    Standing Expiration Date:   04/15/2021  . COMPLETE METABOLIC PANEL WITH GFR    Standing Status:   Future  Standing Expiration Date:   04/15/2021    Next appt:  6 mos for CPE, fasting labs before  Brihany Butch L. Royalti Schauf, D.O. [facility] [address], [place] [address] Cell Phone (Mon-Fri 8am-5pm):  [phone] On Call:  [phone] & follow prompts after 5pm & weekends Office Phone:   [phone] Office Fax:  [phone]

## 2020-04-16 ENCOUNTER — Ambulatory Visit: Payer: Medicare HMO | Admitting: Internal Medicine

## 2020-04-16 NOTE — Progress Notes (Signed)
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   See also left hand xray.  Pt also has mychart.

## 2020-04-16 NOTE — Progress Notes (Signed)
Left hand xray also shows osteoarthritis at the base of her thumb, mild arthritis at the two joints of her index finger and spurs.  She has mild erosion at the joint on the 3rd finger (middle finger) closer to the tip of her finger (distal) and on the pinky side.    The good news with both of these is that it is osteoarthritis aka "run of the mill arthur" or degenerative joint disease from wear and tear.  Treatments are conservative with pain management, steroid injections when pain is severe and not relieved with tylenol or topicals, heat.   See other hand and sent in mychart also.

## 2020-04-19 DIAGNOSIS — I700 Atherosclerosis of aorta: Secondary | ICD-10-CM | POA: Insufficient documentation

## 2020-05-07 ENCOUNTER — Telehealth: Payer: Self-pay

## 2020-05-07 NOTE — Telephone Encounter (Signed)
Discussed Dr.reed's response with patient.

## 2020-05-07 NOTE — Telephone Encounter (Signed)
Yes, she may also use voltaren gel safely

## 2020-05-07 NOTE — Telephone Encounter (Signed)
Tamara Davidson called asking if it is ok for his wife to use Voltaren gel along with her current medications.  Please advise

## 2020-06-12 ENCOUNTER — Other Ambulatory Visit: Payer: Self-pay | Admitting: Internal Medicine

## 2020-06-14 NOTE — Telephone Encounter (Signed)
rx sent to pharmacy by e-script  

## 2020-07-20 ENCOUNTER — Ambulatory Visit (INDEPENDENT_AMBULATORY_CARE_PROVIDER_SITE_OTHER): Payer: Medicare HMO | Admitting: *Deleted

## 2020-07-20 ENCOUNTER — Other Ambulatory Visit: Payer: Self-pay

## 2020-07-20 DIAGNOSIS — Z23 Encounter for immunization: Secondary | ICD-10-CM | POA: Diagnosis not present

## 2020-10-12 LAB — HM MAMMOGRAPHY

## 2020-10-14 ENCOUNTER — Other Ambulatory Visit: Payer: Self-pay

## 2020-10-14 ENCOUNTER — Other Ambulatory Visit: Payer: Medicare HMO

## 2020-10-14 DIAGNOSIS — I10 Essential (primary) hypertension: Secondary | ICD-10-CM

## 2020-10-14 DIAGNOSIS — E7800 Pure hypercholesterolemia, unspecified: Secondary | ICD-10-CM

## 2020-10-14 DIAGNOSIS — R739 Hyperglycemia, unspecified: Secondary | ICD-10-CM

## 2020-10-15 LAB — COMPLETE METABOLIC PANEL WITH GFR
AG Ratio: 1.4 (calc) (ref 1.0–2.5)
ALT: 14 U/L (ref 6–29)
AST: 16 U/L (ref 10–35)
Albumin: 4.2 g/dL (ref 3.6–5.1)
Alkaline phosphatase (APISO): 81 U/L (ref 37–153)
BUN: 17 mg/dL (ref 7–25)
CO2: 29 mmol/L (ref 20–32)
Calcium: 9.6 mg/dL (ref 8.6–10.4)
Chloride: 103 mmol/L (ref 98–110)
Creat: 0.84 mg/dL (ref 0.60–0.93)
GFR, Est African American: 79 mL/min/{1.73_m2} (ref >=60)
GFR, Est Non African American: 68 mL/min/{1.73_m2} (ref >=60)
Globulin: 2.9 g/dL (calc) (ref 1.9–3.7)
Glucose, Bld: 96 mg/dL (ref 65–99)
Potassium: 4.2 mmol/L (ref 3.5–5.3)
Sodium: 139 mmol/L (ref 135–146)
Total Bilirubin: 0.5 mg/dL (ref 0.2–1.2)
Total Protein: 7.1 g/dL (ref 6.1–8.1)

## 2020-10-15 LAB — CBC WITH DIFFERENTIAL/PLATELET
Absolute Monocytes: 476 cells/uL (ref 200–950)
Basophils Absolute: 21 cells/uL (ref 0–200)
Basophils Relative: 0.3 %
Eosinophils Absolute: 90 cells/uL (ref 15–500)
Eosinophils Relative: 1.3 %
HCT: 41.2 % (ref 35.0–45.0)
Hemoglobin: 14.1 g/dL (ref 11.7–15.5)
Lymphs Abs: 3409 cells/uL (ref 850–3900)
MCH: 31.2 pg (ref 27.0–33.0)
MCHC: 34.2 g/dL (ref 32.0–36.0)
MCV: 91.2 fL (ref 80.0–100.0)
MPV: 9.7 fL (ref 7.5–12.5)
Monocytes Relative: 6.9 %
Neutro Abs: 2905 cells/uL (ref 1500–7800)
Neutrophils Relative %: 42.1 %
Platelets: 233 10*3/uL (ref 140–400)
RBC: 4.52 10*6/uL (ref 3.80–5.10)
RDW: 12.7 % (ref 11.0–15.0)
Total Lymphocyte: 49.4 %
WBC: 6.9 10*3/uL (ref 3.8–10.8)

## 2020-10-15 LAB — HEMOGLOBIN A1C
Hgb A1c MFr Bld: 6 % of total Hgb — ABNORMAL HIGH (ref <5.7)
Mean Plasma Glucose: 126 mg/dL
eAG (mmol/L): 7 mmol/L

## 2020-10-15 LAB — LIPID PANEL
Cholesterol: 203 mg/dL — ABNORMAL HIGH (ref <200)
HDL: 67 mg/dL (ref >=50)
LDL Cholesterol (Calc): 113 mg/dL (calc) — ABNORMAL HIGH
Non-HDL Cholesterol (Calc): 136 mg/dL (calc) — ABNORMAL HIGH (ref <130)
Total CHOL/HDL Ratio: 3 (calc) (ref <5.0)
Triglycerides: 122 mg/dL (ref <150)

## 2020-10-15 NOTE — Progress Notes (Signed)
Blood counts, kidneys, liver and electrolytes are normal. Sugar average went up to 6.   Bad cholesterol is up to 113 with goal for her under 100.   We'll discuss at her visit.

## 2020-10-18 ENCOUNTER — Encounter: Payer: Self-pay | Admitting: Internal Medicine

## 2020-10-18 ENCOUNTER — Other Ambulatory Visit: Payer: Self-pay

## 2020-10-18 ENCOUNTER — Ambulatory Visit (INDEPENDENT_AMBULATORY_CARE_PROVIDER_SITE_OTHER): Payer: Medicare HMO | Admitting: Internal Medicine

## 2020-10-18 VITALS — BP 110/58 | HR 86 | Temp 97.7°F | Ht 61.0 in | Wt 116.4 lb

## 2020-10-18 DIAGNOSIS — I10 Essential (primary) hypertension: Secondary | ICD-10-CM | POA: Diagnosis not present

## 2020-10-18 DIAGNOSIS — Z0000 Encounter for general adult medical examination without abnormal findings: Secondary | ICD-10-CM | POA: Diagnosis not present

## 2020-10-18 DIAGNOSIS — M21619 Bunion of unspecified foot: Secondary | ICD-10-CM

## 2020-10-18 DIAGNOSIS — R739 Hyperglycemia, unspecified: Secondary | ICD-10-CM

## 2020-10-18 DIAGNOSIS — E7800 Pure hypercholesterolemia, unspecified: Secondary | ICD-10-CM | POA: Diagnosis not present

## 2020-10-18 NOTE — Progress Notes (Signed)
Provider:  Gwenith Spitz. Renato Gails, D.O., C.M.D. Location:   [facility]   Place of Service:   clinic  Previous PCP: Kermit Balo, DO Patient Care Team: Kermit Balo, DO as PCP - General (Geriatric Medicine) Beverely Low, MD as Consulting Physician (Orthopedic Surgery) Mateo Flow, MD as Consulting Physician (Ophthalmology)  Extended Emergency Contact Information Primary Emergency Contact: Schuitema,JOSEPH Address: [address]          [place]  [address] Home Phone: [phone] Relation: Spouse  Code Status: DNR Goals of Care: Advanced Directive information Advanced Directives 10/18/2020  Does Patient Have a Medical Advance Directive? Yes  Type of Advance Directive Out of facility DNR (pink MOST or yellow form)  Does patient want to make changes to medical advance directive? No - Patient declined  Copy of Healthcare Power of Attorney in Chart? -  Would patient like information on creating a medical advance directive? -  Pre-existing out of facility DNR order (yellow form or pink MOST form) -   Chief Complaint  Patient presents with  . Annual Exam    CPE    HPI: Patient is a 74 y.o. female seen today for an annual physical exam.  Had her ob/gyn appt 12/14 and mammogram.  Has annual f/u.    She admits to being naughty with her eating habits and her exercise regimen.  She's had weeks where she missed her exercise due to appts.  Doesn't normally eat bread with dinner (usually only once a day), but had them on the cruise--2-3 at a time, and a bread pudding with vanilla sauce.  She also baked sweet potato pie, apple pie and ate biscottis.    Past Medical History:  Diagnosis Date  . Disorder of bone and cartilage, unspecified   . 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 Diverticulitis of colon (without mention of hemorrhage)(562.11)   . Essential hypertension, benign   . Ganglion of tendon sheath   . Lumbago   . Other abnormal blood chemistry   .  Other and unspecified hyperlipidemia   . Premenopausal menorrhagia   . Sebaceous cyst   . Shortness of breath    Past Surgical History:  Procedure Laterality Date  . ROTATOR CUFF REPAIR Left 10/06/13   tendon and ligament repair    reports that she has quit smoking. She started smoking about 51 years ago. She has a 5.00 pack-year smoking history. She has never used smokeless tobacco. She reports that she does not drink alcohol and does not use drugs.  Functional Status Survey:    Family History  Problem Relation Age of Onset  . Lung cancer Mother   . Osteoporosis Mother   . Coronary artery disease Father     Health Maintenance  Topic Date Due  . MAMMOGRAM  10/12/2022  . TETANUS/TDAP  11/08/2027  . COLONOSCOPY  06/02/2029  . INFLUENZA VACCINE  Completed  . DEXA SCAN  Completed  . COVID-19 Vaccine  Completed  . Hepatitis C Screening  Completed  . PNA vac Low Risk Adult  Completed    Allergies  Allergen Reactions  . Chocolate Diarrhea  . Iodine   . Penicillins   . Shellfish Allergy   . Statins Other (See Comments)    High LFT's  . Strawberry (Diagnostic) Itching    Outpatient Encounter Medications as of 10/18/2020  Medication Sig  . aspirin 81 MG tablet Take 81 mg by mouth daily.   . Biotin 1000 MCG tablet Take 1,000  mcg by mouth daily.  . calcium carbonate (OSCAL) 1500 (600 Ca) MG TABS tablet Take 1 tablet by mouth 2 (two) times daily with a meal.  . cholecalciferol (VITAMIN D) 400 UNITS TABS tablet Take 400 Units by mouth daily.   . fluticasone (FLONASE) 50 MCG/ACT nasal spray Place 2 sprays into both nostrils daily.  . Glucosamine-Chondroit-Vit C-Mn (GLUCOSAMINE CHONDR 1500 COMPLX PO) Take by mouth daily.  Marland Kitchen guaiFENesin (MUCINEX) 600 MG 12 hr tablet Take 600 mg by mouth daily.   Marland Kitchen lisinopril-hydrochlorothiazide (ZESTORETIC) 10-12.5 MG tablet TAKE 1 TABLET DAILY  . Multiple Vitamins-Minerals (MULTIVITAMIN WITH MINERALS) tablet Take 1 tablet by mouth daily.  .  Probiotic Product (PROBIOTIC DAILY PO) Take 1 tablet by mouth daily.   . rosuvastatin (CRESTOR) 5 MG tablet TAKE ONE-HALF (1/2) TABLET AT BEDTIME ON MONDAY, WEDNESDAY, AND FRIDAY (AVOID GRAPEFRUIT PRODUCTS)  . vitamin B-12 (CYANOCOBALAMIN) 500 MCG tablet Take 500 mcg by mouth daily.  . vitamin C (ASCORBIC ACID) 500 MG tablet Take 500 mg by mouth daily.   No facility-administered encounter medications on file as of 10/18/2020.    Review of Systems  Constitutional: Negative for chills, fever and malaise/fatigue.  HENT: Negative for congestion, hearing loss and sore throat.   Eyes: Negative for blurred vision.  Respiratory: Negative for cough and shortness of breath.   Cardiovascular: Negative for chest pain, palpitations and leg swelling.  Gastrointestinal: Negative for abdominal pain, blood in stool, constipation, diarrhea and melena.  Genitourinary: Negative for dysuria.  Musculoskeletal: Positive for joint pain. Negative for falls.  Skin: Negative for itching and rash.  Neurological: Negative for dizziness and loss of consciousness.  Endo/Heme/Allergies: Does not bruise/bleed easily.  Psychiatric/Behavioral: Negative for depression and memory loss. The patient is not nervous/anxious and does not have insomnia.     Vitals:   10/18/20 1336  BP: (!) 110/58  Pulse: 86  Temp: 97.7 F (36.5 C)  TempSrc: Temporal  SpO2: 99%  Weight: 116 lb 6.4 oz (52.8 kg)  Height: 5\' 1"  (1.549 m)   Body mass index is 21.99 kg/m. Physical Exam Vitals reviewed.  Constitutional:      Appearance: Normal appearance.  HENT:     Head: Normocephalic and atraumatic.     Right Ear: External ear normal.     Left Ear: External ear normal.     Nose: Nose normal.     Mouth/Throat:     Pharynx: Oropharynx is clear.  Eyes:     Extraocular Movements: Extraocular movements intact.     Conjunctiva/sclera: Conjunctivae normal.     Pupils: Pupils are equal, round, and reactive to light.  Cardiovascular:      Rate and Rhythm: Normal rate and regular rhythm.     Pulses: Normal pulses.     Heart sounds: Normal heart sounds.  Pulmonary:     Effort: Pulmonary effort is normal.     Breath sounds: Normal breath sounds. No wheezing, rhonchi or rales.  Abdominal:     General: Bowel sounds are normal. There is no distension.     Palpations: Abdomen is soft.     Tenderness: There is no abdominal tenderness. There is no guarding or rebound.  Musculoskeletal:        General: Normal range of motion.     Cervical back: Neck supple.     Right lower leg: No edema.     Left lower leg: No edema.  Lymphadenopathy:     Cervical: No cervical adenopathy.  Skin:    General:  Skin is warm and dry.     Capillary Refill: Capillary refill takes less than 2 seconds.  Neurological:     General: No focal deficit present.     Mental Status: She is alert and oriented to person, place, and time.     Cranial Nerves: No cranial nerve deficit.     Sensory: No sensory deficit.     Motor: No weakness.     Coordination: Coordination normal.     Gait: Gait normal.     Deep Tendon Reflexes: Reflexes normal.  Psychiatric:        Mood and Affect: Mood normal.        Behavior: Behavior normal.        Thought Content: Thought content normal.        Judgment: Judgment normal.     Labs reviewed: Basic Metabolic Panel: Recent Labs    04/09/20 1033 10/14/20 0903  NA 140 139  K 3.7 4.2  CL 103 103  CO2 28 29  GLUCOSE 101* 96  BUN 18 17  CREATININE 0.87 0.84  CALCIUM 9.5 9.6   Liver Function Tests: Recent Labs    04/09/20 1033 10/14/20 0903  AST 14 16  ALT 10 14  BILITOT 0.4 0.5  PROT 7.3 7.1   No results for input(s): LIPASE, AMYLASE in the last 8760 hours. No results for input(s): AMMONIA in the last 8760 hours. CBC: Recent Labs    04/09/20 1033 10/14/20 0903  WBC 5.3 6.9  NEUTROABS 2,184 2,905  HGB 13.6 14.1  HCT 40.2 41.2  MCV 90.3 91.2  PLT 232 233   Cardiac Enzymes: No results for input(s):  CKTOTAL, CKMB, CKMBINDEX, TROPONINI in the last 8760 hours. BNP: Invalid input(s): POCBNP Lab Results  Component Value Date   HGBA1C 6.0 (H) 10/14/2020   No results found for: TSH Lab Results  Component Value Date   VITAMINB12 1,618 (H) 10/13/2019   No results found for: FOLATE No results found for: IRON, TIBC, FERRITIN  Imaging and Procedures Recently: Need copy of mammogram that she just had thru gyn  Assessment/Plan 1. Annual physical exam -performed today and had pelvic and breast exam with gyn plus mammogram - Lipid panel; Future - Hemoglobin A1c; Future - BASIC METABOLIC PANEL WITH GFR; Future  2. Pure hypercholesterolemia - reduced sweets and starchy carbs and f/u in 6 mos; keep crestor the same for now b/c I know she can get her LDL under 100 w/o med changes - Lipid panel; Future  3. Hyperglycemia - reduce sweets and starchy carbs, f/u in 6 mos - Hemoglobin A1c; Future  4. Essential hypertension, benign - bp at goal with current regimen, no changes needed - BASIC METABOLIC PANEL WITH GFR; Future  5. Bunion -plans on surgery for her this year to manage this  Labs/tests ordered:  Wessley Emert L. Labrian Torregrossa, D.O. Geriatrics [facility] [address] [place], [place] [address] Cell Phone (Mon-Fri 8am-5pm):  [phone] On Call:  [phone] & follow prompts after 5pm & weekends Office Phone:  [phone] Office Fax:  [phone]

## 2020-10-18 NOTE — Patient Instructions (Signed)
Cut down on your carbs again and sweets to lower your bad cholesterol and sugar.

## 2020-12-20 ENCOUNTER — Encounter: Payer: Self-pay | Admitting: Internal Medicine

## 2020-12-22 ENCOUNTER — Other Ambulatory Visit: Payer: Self-pay | Admitting: Internal Medicine

## 2021-04-12 ENCOUNTER — Other Ambulatory Visit: Payer: Self-pay

## 2021-04-12 ENCOUNTER — Encounter: Payer: Self-pay | Admitting: Family

## 2021-04-12 ENCOUNTER — Ambulatory Visit (INDEPENDENT_AMBULATORY_CARE_PROVIDER_SITE_OTHER): Payer: Medicare HMO | Admitting: Family

## 2021-04-12 DIAGNOSIS — Z0000 Encounter for general adult medical examination without abnormal findings: Secondary | ICD-10-CM | POA: Diagnosis not present

## 2021-04-12 NOTE — Patient Instructions (Signed)
Tamara Davidson , Thank you for taking time to come for your Medicare Wellness Visit. I appreciate your ongoing commitment to your health goals. Please review the following plan we discussed and let me know if I can assist you in the future.  Screening recommendations/referrals: Colonoscopy : Up to date  Mammogram Due December,2022  Bone Density : Up to date  Recommended yearly ophthalmology/optometry visit for glaucoma screening and checkup Recommended yearly dental visit for hygiene and checkup  Vaccinations: Influenza vaccine : Up to date  Pneumococcal vaccine : Up to date  Tdap vaccine : Up to date  Shingles vaccine : Up to date   Advanced directives: yes  Conditions/risks identified: Advance age female > 75 yrs,Hypertension,Hx of smoking   Next appointment: 1 year    Preventive Care 75 Years and Older, Female Preventive care refers to lifestyle choices and visits with your health care provider that can promote health and wellness. What does preventive care include? A yearly physical exam. This is also called an annual well check. Dental exams once or twice a year. Routine eye exams. Ask your health care provider how often you should have your eyes checked. Personal lifestyle choices, including: Daily care of your teeth and gums. Regular physical activity. Eating a healthy diet. Avoiding tobacco and drug use. Limiting alcohol use. Practicing safe sex. Taking low-dose aspirin every day. Taking vitamin and mineral supplements as recommended by your health care provider. What happens during an annual well check? The services and screenings done by your health care provider during your annual well check will depend on your age, overall health, lifestyle risk factors, and family history of disease. Counseling  Your health care provider may ask you questions about your: Alcohol use. Tobacco use. Drug use. Emotional well-being. Home and relationship well-being. Sexual  activity. Eating habits. History of falls. Memory and ability to understand (cognition). Work and work Astronomer. Reproductive health. Screening  You may have the following tests or measurements: Height, weight, and BMI. Blood pressure. Lipid and cholesterol levels. These may be checked every 5 years, or more frequently if you are over 3 years old. Skin check. Lung cancer screening. You may have this screening every year starting at age 65 if you have a 30-pack-year history of smoking and currently smoke or have quit within the past 15 years. Fecal occult blood test (FOBT) of the stool. You may have this test every year starting at age 46. Flexible sigmoidoscopy or colonoscopy. You may have a sigmoidoscopy every 5 years or a colonoscopy every 10 years starting at age 74. Hepatitis C blood test. Hepatitis B blood test. Sexually transmitted disease (STD) testing. Diabetes screening. This is done by checking your blood sugar (glucose) after you have not eaten for a while (fasting). You may have this done every 1-3 years. Bone density scan. This is done to screen for osteoporosis. You may have this done starting at age 45. Mammogram. This may be done every 1-2 years. Talk to your health care provider about how often you should have regular mammograms. Talk with your health care provider about your test results, treatment options, and if necessary, the need for more tests. Vaccines  Your health care provider may recommend certain vaccines, such as: Influenza vaccine. This is recommended every year. Tetanus, diphtheria, and acellular pertussis (Tdap, Td) vaccine. You may need a Td booster every 10 years. Zoster vaccine. You may need this after age 42. Pneumococcal 13-valent conjugate (PCV13) vaccine. One dose is recommended after age 28. Pneumococcal polysaccharide (  PPSV23) vaccine. One dose is recommended after age 79. Talk to your health care provider about which screenings and vaccines  you need and how often you need them. This information is not intended to replace advice given to you by your health care provider. Make sure you discuss any questions you have with your health care provider. Document Released: 11/12/2015 Document Revised: 07/05/2016 Document Reviewed: 08/17/2015 Elsevier Interactive Patient Education  2017 [employer] Prevention in the Home Falls can cause injuries. They can happen to people of all ages. There are many things you can do to make your home safe and to help prevent falls. What can I do on the outside of my home? Regularly fix the edges of walkways and driveways and fix any cracks. Remove anything that might make you trip as you walk through a door, such as a raised step or threshold. Trim any bushes or trees on the path to your home. Use bright outdoor lighting. Clear any walking paths of anything that might make someone trip, such as rocks or tools. Regularly check to see if handrails are loose or broken. Make sure that both sides of any steps have handrails. Any raised decks and porches should have guardrails on the edges. Have any leaves, snow, or ice cleared regularly. Use sand or salt on walking paths during winter. Clean up any spills in your garage right away. This includes oil or grease spills. What can I do in the bathroom? Use night lights. Install grab bars by the toilet and in the tub and shower. Do not use towel bars as grab bars. Use non-skid mats or decals in the tub or shower. If you need to sit down in the shower, use a plastic, non-slip stool. Keep the floor dry. Clean up any water that spills on the floor as soon as it happens. Remove soap buildup in the tub or shower regularly. Attach bath mats securely with double-sided non-slip rug tape. Do not have throw rugs and other things on the floor that can make you trip. What can I do in the bedroom? Use night lights. Make sure that you have a light by your bed that  is easy to reach. Do not use any sheets or blankets that are too big for your bed. They should not hang down onto the floor. Have a firm chair that has side arms. You can use this for support while you get dressed. Do not have throw rugs and other things on the floor that can make you trip. What can I do in the kitchen? Clean up any spills right away. Avoid walking on wet floors. Keep items that you use a lot in easy-to-reach places. If you need to reach something above you, use a strong step stool that has a grab bar. Keep electrical cords out of the way. Do not use floor polish or wax that makes floors slippery. If you must use wax, use non-skid floor wax. Do not have throw rugs and other things on the floor that can make you trip. What can I do with my stairs? Do not leave any items on the stairs. Make sure that there are handrails on both sides of the stairs and use them. Fix handrails that are broken or loose. Make sure that handrails are as long as the stairways. Check any carpeting to make sure that it is firmly attached to the stairs. Fix any carpet that is loose or worn. Avoid having throw rugs at the top or  bottom of the stairs. If you do have throw rugs, attach them to the floor with carpet tape. Make sure that you have a light switch at the top of the stairs and the bottom of the stairs. If you do not have them, ask someone to add them for you. What else can I do to help prevent falls? Wear shoes that: Do not have high heels. Have rubber bottoms. Are comfortable and fit you well. Are closed at the toe. Do not wear sandals. If you use a stepladder: Make sure that it is fully opened. Do not climb a closed stepladder. Make sure that both sides of the stepladder are locked into place. Ask someone to hold it for you, if possible. Clearly mark and make sure that you can see: Any grab bars or handrails. First and last steps. Where the edge of each step is. Use tools that help you  move around (mobility aids) if they are needed. These include: Canes. Walkers. Scooters. Crutches. Turn on the lights when you go into a dark area. Replace any light bulbs as soon as they burn out. Set up your furniture so you have a clear path. Avoid moving your furniture around. If any of your floors are uneven, fix them. If there are any pets around you, be aware of where they are. Review your medicines with your doctor. Some medicines can make you feel dizzy. This can increase your chance of falling. Ask your doctor what other things that you can do to help prevent falls. This information is not intended to replace advice given to you by your health care provider. Make sure you discuss any questions you have with your health care provider. Document Released: 08/12/2009 Document Revised: 03/23/2016 Document Reviewed: 11/20/2014 Elsevier Interactive Patient Education  2017 [employer].

## 2021-04-12 NOTE — Progress Notes (Signed)
This service is provided via telemedicine  No vital signs collected/recorded due to the encounter was a telemedicine visit.   Location of patient (ex: home, work): Home.  Patient consents to a telephone visit:  Yes  Location of the provider (ex: office, home): [employer].  Name of any referring provider: [name], NP   Names of all persons participating in the telemedicine service and their role in the encounter: Patient, Meda Klinefelter, RMA, Tayva Easterday, Carilyn Goodpasture, NP.    Time spent on call: 8 minutes spent on the phone with Medical Assistant.     Subjective:   Tamara Davidson is a 75 y.o. female who presents for Medicare Annual (Subsequent) preventive examination.  Review of Systems     Cardiac Risk Factors include: advanced age (>61men, >38 women);hypertension;smoking/ tobacco exposure     Objective:    Today's Vitals   04/12/21 1323  PainSc: 1    There is no height or weight on file to calculate BMI.  Advanced Directives 04/12/2021 10/18/2020 04/15/2020 04/09/2020 04/14/2019 04/09/2019 04/11/2018  Does Patient Have a Medical Advance Directive? Yes Yes Yes Yes No Yes Yes  Type of Estate agent of [place];Living will Out of facility DNR (pink MOST or yellow form) Living will;Out of facility DNR (pink MOST or yellow form) Living will;Healthcare Power of [employer] of [place];Living will Living will Healthcare Power of [place];Living will  Does patient want to make changes to medical advance directive? No - Patient declined No - Patient declined No - Patient declined No - Patient declined - - No - Patient declined  Copy of Healthcare Power of Attorney in Chart? Yes - validated most recent copy scanned in chart (See row information) - Yes - validated most recent copy scanned in chart (See row information) Yes - validated most recent copy scanned in chart (See row information) Yes - validated most recent copy scanned in chart (See  row information) - Yes  Would patient like information on creating a medical advance directive? - - - - No - Patient declined - -  Pre-existing out of facility DNR order (yellow form or pink MOST form) - - Pink MOST/Yellow Form most recent copy in chart - Physician notified to receive inpatient order - - - -    Current Medications (verified) Outpatient Encounter Medications as of 04/12/2021  Medication Sig   aspirin 81 MG tablet Take 81 mg by mouth daily.    B Complex Vitamins (B COMPLEX PO) Take 1 tablet by mouth daily.   calcium carbonate (OSCAL) 1500 (600 Ca) MG TABS tablet Take 1 tablet by mouth 2 (two) times daily with a meal.   cholecalciferol (VITAMIN D) 400 UNITS TABS tablet Take 400 Units by mouth daily.    fluticasone (FLONASE) 50 MCG/ACT nasal spray Place 2 sprays into both nostrils daily.   Glucosamine-Chondroit-Vit C-Mn (GLUCOSAMINE CHONDR 1500 COMPLX PO) Take by mouth daily.   guaiFENesin (MUCINEX) 600 MG 12 hr tablet Take 600 mg by mouth daily.    lisinopril-hydrochlorothiazide (ZESTORETIC) 10-12.5 MG tablet TAKE 1 TABLET DAILY   Loratadine (CLARITIN) 10 MG CAPS Take 1 capsule by mouth as needed.   Multiple Vitamins-Minerals (MULTIVITAMIN WITH MINERALS) tablet Take 1 tablet by mouth daily.   Probiotic Product (PROBIOTIC DAILY PO) Take 1 tablet by mouth daily.    rosuvastatin (CRESTOR) 5 MG tablet TAKE ONE-HALF (1/2) TABLET AT BEDTIME ON MONDAY, WEDNESDAY, AND FRIDAY (AVOID GRAPEFRUIT PRODUCTS)   vitamin C (ASCORBIC ACID) 500 MG tablet  Take 500 mg by mouth daily.   [DISCONTINUED] Biotin 1000 MCG tablet Take 1,000 mcg by mouth daily.   [DISCONTINUED] vitamin B-12 (CYANOCOBALAMIN) 500 MCG tablet Take 500 mcg by mouth daily.   No facility-administered encounter medications on file as of 04/12/2021.    Allergies (verified) Chocolate, Iodine, Penicillins, Shellfish allergy, Statins, and Strawberry (diagnostic)   History: Past Medical History:  Diagnosis Date   Disorder of bone  and cartilage, unspecified    Disorders of bursae and tendons in shoulder region, unspecified    Diverticulitis of colon (without mention of hemorrhage)(562.11)    Essential hypertension, benign    Ganglion of tendon sheath    Lumbago    Other abnormal blood chemistry    Other and unspecified hyperlipidemia    Premenopausal menorrhagia    Sebaceous cyst    Shortness of breath    Past Surgical History:  Procedure Laterality Date   ROTATOR CUFF REPAIR Left 10/06/13   tendon and ligament repair   Family History  Problem Relation Age of Onset   Lung cancer Mother    Osteoporosis Mother    Coronary artery disease Father    Social History   Socioeconomic History   Marital status: Married    Spouse name: Not on file   Number of children: Not on file   Years of education: Not on file   Highest education level: Not on file  Occupational History   Not on file  Tobacco Use   Smoking status: Former    Packs/day: 1.00    Years: 5.00    Pack years: 5.00    Types: Cigarettes    Start date: 10/30/1969   Smokeless tobacco: Never  Vaping Use   Vaping Use: Never used  Substance and Sexual Activity   Alcohol use: No    Alcohol/week: 0.0 standard drinks   Drug use: No   Sexual activity: Not on file  Other Topics Concern   Not on file  Social History Narrative   Married 23 years, with Jomarie Longs for 29 years   Social Determinants of Corporate investment banker Strain: Not on file  Food Insecurity: Not on file  Transportation Needs: Not on file  Physical Activity: Not on file  Stress: Not on file  Social Connections: Not on file    Tobacco Counseling Counseling given: Not Answered   Clinical Intake:  Pre-visit preparation completed: No  Pain : 0-10 Pain Score: 1  Pain Type: Chronic pain Pain Location: Hand Pain Orientation: Left, Right Pain Radiating Towards: no Pain Descriptors / Indicators: Aching Pain Onset: More than a month ago Pain Frequency: Intermittent Pain  Relieving Factors: exercise,tylenol Effect of Pain on Daily Activities: No  Pain Relieving Factors: exercise,tylenol  BMI - recorded: 21.99 Nutritional Status: BMI of 19-24  Normal Nutritional Risks: None Diabetes: No  How often do you need to have someone help you when you read instructions, pamphlets, or other written materials from your doctor or pharmacy?: 1 - Never What is the last grade level you completed in school?: College  Diabetic?No   Interpreter Needed?: No  Information entered by :: Fawna Cranmer,FNP-C   Activities of Daily Living In your present state of health, do you have any difficulty performing the following activities: 04/12/2021  Hearing? N  Vision? N  Difficulty concentrating or making decisions? N  Walking or climbing stairs? N  Dressing or bathing? N  Doing errands, shopping? N  Preparing Food and eating ? N  Using the Toilet? N  In the past six months, have you accidently leaked urine? N  Do you have problems with loss of bowel control? N  Managing your Medications? N  Managing your Finances? N  Housekeeping or managing your Housekeeping? N  Some recent data might be hidden    Patient Care Team: Octavia HeirFargo, Amy E, NP as PCP - General (Adult Health Nurse Practitioner) Beverely LowNorris, Steve, MD as Consulting Physician (Orthopedic Surgery) Mateo FlowHecker, Kathryn, MD as Consulting Physician (Ophthalmology)  Indicate any recent Medical Services you may have received from other than [facility] providers in the past year (date may be approximate).     Assessment:   This is a routine wellness examination for Ava.  Hearing/Vision screen Hearing Screening - Comments:: No Hearing Concerns. Patient doesn't wear hearing aids.  Vision Screening - Comments:: No Vision Concerns. Patient wears reading glasses.   Dietary issues and exercise activities discussed: Current Exercise Habits: Structured exercise class, Type of exercise: yoga, Time (Minutes): 45, Frequency  (Times/Week): 2, Weekly Exercise (Minutes/Week): 90, Intensity: Mild, Exercise limited by: None identified   Goals Addressed             This Visit's Progress    Lose Belly Fat   Not on track    Starting today pt will increase gym days to 4 days a week.         Depression Screen PHQ 2/9 Scores 04/12/2021 10/18/2020 04/15/2020 04/09/2020 10/16/2019 04/14/2019 04/09/2019  PHQ - 2 Score 0 0 0 0 0 0 0    Fall Risk Fall Risk  04/12/2021 10/18/2020 04/15/2020 04/09/2020 10/16/2019  Falls in the past year? 0 0 0 0 0  Number falls in past yr: 0 0 0 0 0  Injury with Fall? 0 0 0 0 -  Comment - - - - -    FALL RISK PREVENTION PERTAINING TO THE HOME:  Any stairs in or around the home? No  If so, are there any without handrails? No  Home free of loose throw rugs in walkways, pet beds, electrical cords, etc? No  Adequate lighting in your home to reduce risk of falls? Yes   ASSISTIVE DEVICES UTILIZED TO PREVENT FALLS:  Life alert? No  Use of a cane, walker or w/c? No  Grab bars in the bathroom? Yes  Shower chair or bench in shower? No  Elevated toilet seat or a handicapped toilet? Yes   TIMED UP AND GO:  Was the test performed? No .  Length of time to ambulate 10 feet: N/A sec.   Gait steady and fast without use of assistive device  Cognitive Function: MMSE - Mini Mental State Exam 04/09/2019 04/08/2018 03/21/2017 02/10/2016 07/13/2014  Orientation to time 5 5 5 5 5   Orientation to Place 5 5 5 5 5   Registration 3 3 3 3 3   Attention/ Calculation 5 5 5 5 5   Recall 3 3 3 3 2   Language- name 2 objects 2 2 2 2 2   Language- repeat 1 1 1 1 1   Language- follow 3 step command 3 3 3 3 3   Language- read & follow direction 1 1 1 1 1   Write a sentence 1 1 1 1 1   Copy design 1 1 1 1 1   Total score 30 30 30 30 29      6CIT Screen 04/12/2021 04/09/2020  What Year? 0 points 0 points  What month? 0 points 0 points  What time? 0 points 0 points  Count back from 20 0 points 0 points  Months in  reverse 0 points 0 points  Repeat phrase 0 points 2 points  Total Score 0 2    Immunizations Immunization History  Administered Date(s) Administered   Fluad Quad(high Dose 65+) 07/21/2019, 07/20/2020   Influenza Whole 07/28/2010, 07/11/2012   Influenza, High Dose Seasonal PF 07/12/2017, 07/22/2018   Influenza,inj,Quad PF,6+ Mos 07/13/2014, 08/12/2015, 06/15/2016   Influenza-Unspecified 07/30/2013   PFIZER(Purple Top)SARS-COV-2 Vaccination 11/21/2019, 12/12/2019, 06/30/2020   Pneumococcal Conjugate-13 10/08/2014   Pneumococcal Polysaccharide-23 12/06/2010   Tdap 10/15/2007, 11/07/2017   Zoster Recombinat (Shingrix) 09/26/2017, 11/30/2017   Zoster, Live 01/25/2013    TDAP status: Up to date  Flu Vaccine status: Up to date  Pneumococcal vaccine status: Up to date  Covid-19 vaccine status: Completed vaccines  Qualifies for Shingles Vaccine? Yes   Zostavax completed Yes   Shingrix Completed?: Yes  Screening Tests Health Maintenance  Topic Date Due   COVID-19 Vaccine (4 - Booster for Pfizer series) 10/30/2020   INFLUENZA VACCINE  05/30/2021   TETANUS/TDAP  11/08/2027   COLONOSCOPY (Pts 45-89yrs Insurance coverage will need to be confirmed)  06/02/2029   DEXA SCAN  Completed   Hepatitis C Screening  Completed   PNA vac Low Risk Adult  Completed   Zoster Vaccines- Shingrix  Completed   HPV VACCINES  Aged Out    Health Maintenance  Health Maintenance Due  Topic Date Due   COVID-19 Vaccine (4 - Booster for Pfizer series) 10/30/2020    Colorectal cancer screening: No longer required.   Mammogram status: Completed 10/12/2020. Repeat every year  Bone Density status: Completed 06/06/2018. Results reflect: Bone density results: NORMAL. Repeat every 5 years.  Lung Cancer Screening: (Low Dose CT Chest recommended if Age 22-80 years, 30 pack-year currently smoking OR have quit w/in 15years.) does not qualify.   Lung Cancer Screening Referral: No   Additional  Screening:  Hepatitis C Screening: does not qualify; Completed yes 10/10/2018  Vision Screening: Recommended annual ophthalmology exams for early detection of glaucoma and other disorders of the eye. Is the patient up to date with their annual eye exam?  Yes  Who is the provider or what is the name of the office in which the patient attends annual eye exams? Dr.weaver  If pt is not established with a provider, would they like to be referred to a provider to establish care? No .   Dental Screening: Recommended annual dental exams for proper oral hygiene  Community Resource Referral / Chronic Care Management: CRR required this visit?  No   CCM required this visit?  No      Plan:    - will bring COVID-19 vaccine card states had second COVID-19 booster vaccine.  I have personally reviewed and noted the following in the patient's chart:   Medical and social history Use of alcohol, tobacco or illicit drugs  Current medications and supplements including opioid prescriptions.  Functional ability and status Nutritional status Physical activity Advanced directives List of other physicians Hospitalizations, surgeries, and ER visits in previous 12 months Vitals Screenings to include cognitive, depression, and falls Referrals and appointments  In addition, I have reviewed and discussed with patient certain preventive protocols, quality metrics, and best practice recommendations. A written personalized care plan for preventive services as well as general preventive health recommendations were provided to patient.     Caesar Bookman, NP   04/12/2021   Nurse Notes: will bring her COVID-19 second booster vaccine on upcoming visit to be updated.

## 2021-04-13 ENCOUNTER — Other Ambulatory Visit: Payer: Self-pay

## 2021-04-13 DIAGNOSIS — R739 Hyperglycemia, unspecified: Secondary | ICD-10-CM

## 2021-04-13 DIAGNOSIS — I10 Essential (primary) hypertension: Secondary | ICD-10-CM

## 2021-04-13 DIAGNOSIS — E7800 Pure hypercholesterolemia, unspecified: Secondary | ICD-10-CM

## 2021-04-14 ENCOUNTER — Other Ambulatory Visit: Payer: Medicare HMO

## 2021-04-14 ENCOUNTER — Other Ambulatory Visit: Payer: Self-pay

## 2021-04-14 DIAGNOSIS — R739 Hyperglycemia, unspecified: Secondary | ICD-10-CM

## 2021-04-14 DIAGNOSIS — E7800 Pure hypercholesterolemia, unspecified: Secondary | ICD-10-CM

## 2021-04-14 DIAGNOSIS — I10 Essential (primary) hypertension: Secondary | ICD-10-CM

## 2021-04-15 LAB — LIPID PANEL
Cholesterol: 215 mg/dL — ABNORMAL HIGH (ref <200)
HDL: 69 mg/dL (ref >=50)
LDL Cholesterol (Calc): 128 mg/dL (calc) — ABNORMAL HIGH
Non-HDL Cholesterol (Calc): 146 mg/dL (calc) — ABNORMAL HIGH (ref <130)
Total CHOL/HDL Ratio: 3.1 (calc) (ref <5.0)
Triglycerides: 82 mg/dL (ref <150)

## 2021-04-15 LAB — BASIC METABOLIC PANEL WITH GFR
BUN: 25 mg/dL (ref 7–25)
CO2: 29 mmol/L (ref 20–32)
Calcium: 10 mg/dL (ref 8.6–10.4)
Chloride: 101 mmol/L (ref 98–110)
Creat: 0.87 mg/dL (ref 0.60–0.93)
GFR, Est African American: 76 mL/min/{1.73_m2} (ref >=60)
GFR, Est Non African American: 65 mL/min/{1.73_m2} (ref >=60)
Glucose, Bld: 96 mg/dL (ref 65–99)
Potassium: 3.8 mmol/L (ref 3.5–5.3)
Sodium: 138 mmol/L (ref 135–146)

## 2021-04-15 LAB — HEMOGLOBIN A1C
Hgb A1c MFr Bld: 5.8 % of total Hgb — ABNORMAL HIGH (ref <5.7)
Mean Plasma Glucose: 120 mg/dL
eAG (mmol/L): 6.6 mmol/L

## 2021-04-18 ENCOUNTER — Ambulatory Visit: Payer: Medicare HMO | Admitting: Internal Medicine

## 2021-04-19 ENCOUNTER — Ambulatory Visit: Payer: Medicare HMO | Admitting: Orthopedic Surgery

## 2021-04-21 ENCOUNTER — Encounter: Payer: Self-pay | Admitting: Orthopedic Surgery

## 2021-04-21 ENCOUNTER — Ambulatory Visit (INDEPENDENT_AMBULATORY_CARE_PROVIDER_SITE_OTHER): Payer: Medicare HMO | Admitting: Orthopedic Surgery

## 2021-04-21 ENCOUNTER — Other Ambulatory Visit: Payer: Self-pay

## 2021-04-21 VITALS — BP 120/68 | HR 83 | Temp 96.9°F | Wt 117.4 lb

## 2021-04-21 DIAGNOSIS — R739 Hyperglycemia, unspecified: Secondary | ICD-10-CM | POA: Diagnosis not present

## 2021-04-21 DIAGNOSIS — I700 Atherosclerosis of aorta: Secondary | ICD-10-CM | POA: Diagnosis not present

## 2021-04-21 DIAGNOSIS — Z1231 Encounter for screening mammogram for malignant neoplasm of breast: Secondary | ICD-10-CM

## 2021-04-21 DIAGNOSIS — E7800 Pure hypercholesterolemia, unspecified: Secondary | ICD-10-CM | POA: Diagnosis not present

## 2021-04-21 DIAGNOSIS — I10 Essential (primary) hypertension: Secondary | ICD-10-CM | POA: Diagnosis not present

## 2021-04-21 NOTE — Progress Notes (Signed)
Careteam: Patient Care Team: Tamara Heir, NP as PCP - General (Adult Health Nurse Practitioner) Beverely Low, MD as Consulting Physician (Orthopedic Surgery) Mateo Flow, MD as Consulting Physician (Ophthalmology)  Seen by: Hazle Nordmann, AGNP-C  PLACE OF SERVICE:  [facility]  Advanced Directive information    Allergies  Allergen Reactions   Chocolate Diarrhea   Iodine    Penicillins    Shellfish Allergy    Statins Other (See Comments)    High LFT's   Strawberry (Diagnostic) Itching    Chief Complaint  Patient presents with   Medical Management of Chronic Issues    Patient presents today for HLD and HLD.     HPI: Patient is a 75 y.o. female seen today for medical management of chronic conditions.   Labs reviewed with patient. She is upset her LDL was elevated. She admits to eating a lot of cheese throughout the day. Cooks with olive oil. Does not consume fried foods. Since she is sensitive to statin medication, she would like to try dietary changes and recheck lipid panel.   Reports hearing a "whine" in left ear. History of allergies with ear fullness. Asking for ears to be examined today. Denies ear pain, drainage, dizziness or nausea.   Exercises at the local [employer] twice weekly. Participant in [employer] program.   Mammogram due in December 2022, she plans to continue having test done. Denies changes to breasts.   No recent falls or injuries.   Plans to attend sons wedding later this summer in [place].   Up to date on covid vaccine 2nd booster.  Review of Systems:  Review of Systems  Constitutional:  Negative for chills, fever, malaise/fatigue and weight loss.  HENT:  Positive for congestion and tinnitus. Negative for ear pain, hearing loss and sinus pain.   Eyes:  Negative for blurred vision.  Respiratory:  Negative for cough, shortness of breath and wheezing.   Cardiovascular:  Negative for chest pain and leg swelling.  Gastrointestinal:  Negative  for abdominal pain, blood in stool, constipation, diarrhea, heartburn, nausea and vomiting.  Genitourinary:  Negative for dysuria, frequency and hematuria.  Musculoskeletal:  Negative for falls and joint pain.  Skin: Negative.   Neurological:  Negative for dizziness, weakness and headaches.  Psychiatric/Behavioral:  Negative for depression. The patient is not nervous/anxious and does not have insomnia.    Past Medical History:  Diagnosis Date   Disorder of bone and cartilage, unspecified    Disorders of bursae and tendons in shoulder region, unspecified    Diverticulitis of colon (without mention of hemorrhage)(562.11)    Essential hypertension, benign    Ganglion of tendon sheath    Lumbago    Other abnormal blood chemistry    Other and unspecified hyperlipidemia    Premenopausal menorrhagia    Sebaceous cyst    Shortness of breath    Past Surgical History:  Procedure Laterality Date   ROTATOR CUFF REPAIR Left 10/06/13   tendon and ligament repair   Social History:   reports that she has quit smoking. She started smoking about 51 years ago. She has a 5.00 pack-year smoking history. She has never used smokeless tobacco. She reports that she does not drink alcohol and does not use drugs.  Family History  Problem Relation Age of Onset   Lung cancer Mother    Osteoporosis Mother    Coronary artery disease Father     Medications: Patient's Medications  New Prescriptions   No medications  on file  Previous Medications   ASPIRIN 81 MG TABLET    Take 81 mg by mouth daily.    B COMPLEX VITAMINS (B COMPLEX PO)    Take 1 tablet by mouth daily.   CALCIUM CARBONATE (OSCAL) 1500 (600 CA) MG TABS TABLET    Take 1 tablet by mouth 2 (two) times daily with a meal.   CHOLECALCIFEROL (VITAMIN D) 400 UNITS TABS TABLET    Take 400 Units by mouth daily.    FLUTICASONE (FLONASE) 50 MCG/ACT NASAL SPRAY    Place 2 sprays into both nostrils daily.   GLUCOSAMINE-CHONDROIT-VIT C-MN (GLUCOSAMINE  CHONDR 1500 COMPLX PO)    Take by mouth daily.   GUAIFENESIN (MUCINEX) 600 MG 12 HR TABLET    Take 600 mg by mouth daily.    LISINOPRIL-HYDROCHLOROTHIAZIDE (ZESTORETIC) 10-12.5 MG TABLET    TAKE 1 TABLET DAILY   LORATADINE 10 MG CAPS    Take 1 capsule by mouth as needed.   MULTIPLE VITAMINS-MINERALS (MULTIVITAMIN WITH MINERALS) TABLET    Take 1 tablet by mouth daily.   PROBIOTIC PRODUCT (PROBIOTIC DAILY PO)    Take 1 tablet by mouth daily.    ROSUVASTATIN (CRESTOR) 5 MG TABLET    TAKE ONE-HALF (1/2) TABLET AT BEDTIME ON MONDAY, WEDNESDAY, AND FRIDAY (AVOID GRAPEFRUIT PRODUCTS)   VITAMIN C (ASCORBIC ACID) 500 MG TABLET    Take 500 mg by mouth daily.  Modified Medications   No medications on file  Discontinued Medications   No medications on file    Physical Exam:  Vitals:   04/21/21 1112  BP: 120/68  Pulse: 83  Temp: (!) 96.9 F (36.1 C)  SpO2: 98%  Weight: 117 lb 6.4 oz (53.3 kg)   Body mass index is 22.18 kg/m. Wt Readings from Last 3 Encounters:  04/21/21 117 lb 6.4 oz (53.3 kg)  10/18/20 116 lb 6.4 oz (52.8 kg)  04/15/20 115 lb (52.2 kg)    Physical Exam Vitals reviewed.  Constitutional:      General: She is not in acute distress. HENT:     Head: Normocephalic.     Right Ear: Tympanic membrane normal. There is no impacted cerumen.     Left Ear: Tympanic membrane normal. There is no impacted cerumen.     Nose: Nose normal.     Mouth/Throat:     Mouth: Mucous membranes are moist.  Eyes:     General:        Right eye: No discharge.        Left eye: No discharge.  Cardiovascular:     Rate and Rhythm: Normal rate and regular rhythm.     Pulses: Normal pulses.     Heart sounds: Normal heart sounds.  Pulmonary:     Effort: Pulmonary effort is normal. No respiratory distress.     Breath sounds: Normal breath sounds. No wheezing.  Abdominal:     General: Bowel sounds are normal. There is no distension.     Palpations: Abdomen is soft.     Tenderness: There is no  abdominal tenderness.  Musculoskeletal:     Cervical back: Normal range of motion.     Right lower leg: No edema.     Left lower leg: No edema.  Lymphadenopathy:     Cervical: No cervical adenopathy.  Skin:    General: Skin is warm and dry.  Neurological:     General: No focal deficit present.     Mental Status: She is alert and oriented  to person, place, and time.  Psychiatric:        Mood and Affect: Mood normal.        Behavior: Behavior normal.    Labs reviewed: Basic Metabolic Panel: Recent Labs    10/14/20 0903 04/14/21 0916  NA 139 138  K 4.2 3.8  CL 103 101  CO2 29 29  GLUCOSE 96 96  BUN 17 25  CREATININE 0.84 0.87  CALCIUM 9.6 10.0   Liver Function Tests: Recent Labs    10/14/20 0903  AST 16  ALT 14  BILITOT 0.5  PROT 7.1   No results for input(s): LIPASE, AMYLASE in the last 8760 hours. No results for input(s): AMMONIA in the last 8760 hours. CBC: Recent Labs    10/14/20 0903  WBC 6.9  NEUTROABS 2,905  HGB 14.1  HCT 41.2  MCV 91.2  PLT 233   Lipid Panel: Recent Labs    10/14/20 0903 04/14/21 0916  CHOL 203* 215*  HDL 67 69  LDLCALC 113* 128*  TRIG 122 82  CHOLHDL 3.0 3.1   TSH: No results for input(s): TSH in the last 8760 hours. A1C: Lab Results  Component Value Date   HGBA1C 5.8 (H) 04/14/2021     Assessment/Plan 1. Pure hypercholesterolemia - LDL 128 04/14/2021, goal < 100 - cont crestor 2.5 mg daily - recommend reducing cheese consumption, and following low fat dairy products - Hepatic Function Panel; Future - Lipid Panel; Future  2. Hyperglycemia - A1c- 5.8 04/14/2021 - cont to limit carbs and sugars in diet - cont asa 81 mg daily - cont lisinopril-HCTZ 10-12.5 mg daily - Hemoglobin A1c; Future  3. Essential hypertension, benign - controlled - BUN/creat 25/0.87 04/14/2021 - cont lisinopril-HCTZ 10-12.5 mg daily - CBC with Differential/Platelet; Future - Basic Metabolic Panel; Future  4. Atherosclerosis of  aortic arch (HCC) - cont aspirin and statin  5. Screening mammogram for breast cancer - denies changes to breasts  - MM Digital Screening; Future  Total time: 31 minutes. Greater than 50% of total time spent doing patient education on diet modifications regarding elevated cholesterol and diabetes management.    Next appt: Visit date not found  Babs Dabbs Scherry Ran  [facility] & Adult Medicine [phone]

## 2021-04-21 NOTE — Patient Instructions (Addendum)
May try Xyzal for allergies  Eat less cheese, and promote low fat dairy products

## 2021-09-06 ENCOUNTER — Other Ambulatory Visit: Payer: Self-pay | Admitting: *Deleted

## 2021-09-06 NOTE — Telephone Encounter (Signed)
Patient requested refill.  ?Pended Rx and sent to [facility] for approval due to HIGH ALERT Warning.  ?

## 2021-09-07 MED ORDER — ROSUVASTATIN CALCIUM 5 MG PO TABS: ORAL_TABLET | ORAL | 1 refills | Status: DC

## 2021-10-25 ENCOUNTER — Other Ambulatory Visit: Payer: Self-pay

## 2021-10-25 ENCOUNTER — Other Ambulatory Visit: Payer: Medicare HMO

## 2021-10-25 DIAGNOSIS — I10 Essential (primary) hypertension: Secondary | ICD-10-CM

## 2021-10-25 DIAGNOSIS — R739 Hyperglycemia, unspecified: Secondary | ICD-10-CM

## 2021-10-25 DIAGNOSIS — E7800 Pure hypercholesterolemia, unspecified: Secondary | ICD-10-CM

## 2021-10-26 LAB — CBC WITH DIFFERENTIAL/PLATELET
Absolute Monocytes: 406 cells/uL (ref 200–950)
Basophils Absolute: 42 cells/uL (ref 0–200)
Basophils Relative: 0.6 %
Eosinophils Absolute: 119 cells/uL (ref 15–500)
Eosinophils Relative: 1.7 %
HCT: 41.7 % (ref 35.0–45.0)
Hemoglobin: 14.1 g/dL (ref 11.7–15.5)
Lymphs Abs: 3619 cells/uL (ref 850–3900)
MCH: 30.8 pg (ref 27.0–33.0)
MCHC: 33.8 g/dL (ref 32.0–36.0)
MCV: 91 fL (ref 80.0–100.0)
MPV: 10.2 fL (ref 7.5–12.5)
Monocytes Relative: 5.8 %
Neutro Abs: 2814 cells/uL (ref 1500–7800)
Neutrophils Relative %: 40.2 %
Platelets: 212 10*3/uL (ref 140–400)
RBC: 4.58 10*6/uL (ref 3.80–5.10)
RDW: 12.9 % (ref 11.0–15.0)
Total Lymphocyte: 51.7 %
WBC: 7 10*3/uL (ref 3.8–10.8)

## 2021-10-26 LAB — HEPATIC FUNCTION PANEL
AG Ratio: 1.3 (calc) (ref 1.0–2.5)
ALT: 15 U/L (ref 6–29)
AST: 17 U/L (ref 10–35)
Albumin: 4.1 g/dL (ref 3.6–5.1)
Alkaline phosphatase (APISO): 78 U/L (ref 37–153)
Bilirubin, Direct: 0.1 mg/dL (ref 0.0–0.2)
Globulin: 3.2 g/dL (calc) (ref 1.9–3.7)
Indirect Bilirubin: 0.3 mg/dL (calc) (ref 0.2–1.2)
Total Bilirubin: 0.4 mg/dL (ref 0.2–1.2)
Total Protein: 7.3 g/dL (ref 6.1–8.1)

## 2021-10-26 LAB — LIPID PANEL
Cholesterol: 207 mg/dL — ABNORMAL HIGH (ref <200)
HDL: 67 mg/dL (ref >=50)
LDL Cholesterol (Calc): 117 mg/dL (calc) — ABNORMAL HIGH
Non-HDL Cholesterol (Calc): 140 mg/dL (calc) — ABNORMAL HIGH (ref <130)
Total CHOL/HDL Ratio: 3.1 (calc) (ref <5.0)
Triglycerides: 118 mg/dL (ref <150)

## 2021-10-26 LAB — HEMOGLOBIN A1C
Hgb A1c MFr Bld: 5.8 % of total Hgb — ABNORMAL HIGH (ref <5.7)
Mean Plasma Glucose: 120 mg/dL
eAG (mmol/L): 6.6 mmol/L

## 2021-10-26 LAB — BASIC METABOLIC PANEL
BUN: 17 mg/dL (ref 7–25)
CO2: 28 mmol/L (ref 20–32)
Calcium: 9.2 mg/dL (ref 8.6–10.4)
Chloride: 101 mmol/L (ref 98–110)
Creat: 0.84 mg/dL (ref 0.60–1.00)
Glucose, Bld: 92 mg/dL (ref 65–99)
Potassium: 3.9 mmol/L (ref 3.5–5.3)
Sodium: 139 mmol/L (ref 135–146)

## 2021-10-27 ENCOUNTER — Ambulatory Visit (INDEPENDENT_AMBULATORY_CARE_PROVIDER_SITE_OTHER): Payer: Medicare HMO | Admitting: Orthopedic Surgery

## 2021-10-27 ENCOUNTER — Other Ambulatory Visit: Payer: Self-pay

## 2021-10-27 ENCOUNTER — Encounter: Payer: Self-pay | Admitting: Orthopedic Surgery

## 2021-10-27 VITALS — BP 118/56 | HR 83 | Temp 96.8°F | Ht 61.0 in | Wt 114.8 lb

## 2021-10-27 DIAGNOSIS — R739 Hyperglycemia, unspecified: Secondary | ICD-10-CM | POA: Diagnosis not present

## 2021-10-27 DIAGNOSIS — F4321 Adjustment disorder with depressed mood: Secondary | ICD-10-CM

## 2021-10-27 DIAGNOSIS — I10 Essential (primary) hypertension: Secondary | ICD-10-CM

## 2021-10-27 DIAGNOSIS — I700 Atherosclerosis of aorta: Secondary | ICD-10-CM | POA: Diagnosis not present

## 2021-10-27 DIAGNOSIS — J302 Other seasonal allergic rhinitis: Secondary | ICD-10-CM

## 2021-10-27 DIAGNOSIS — M542 Cervicalgia: Secondary | ICD-10-CM

## 2021-10-27 DIAGNOSIS — Z1231 Encounter for screening mammogram for malignant neoplasm of breast: Secondary | ICD-10-CM

## 2021-10-27 DIAGNOSIS — E7800 Pure hypercholesterolemia, unspecified: Secondary | ICD-10-CM | POA: Diagnosis not present

## 2021-10-27 NOTE — Patient Instructions (Signed)
May try voltaren gel as needed for neck pain  May try to wean off Xyzal- continue Flonase- can also try local honey- 1 tsp daily

## 2021-10-27 NOTE — Progress Notes (Signed)
Careteam: Patient Care Team: Tamara Heir, NP as PCP - General (Adult Health Nurse Practitioner) Tamara Low, MD as Consulting Physician (Orthopedic Surgery) Tamara Flow, MD as Consulting Physician (Ophthalmology)  Seen by: Tamara Davidson, AGNP-C  PLACE OF SERVICE:  [facility]  Advanced Directive information    Allergies  Allergen Reactions   Chocolate Diarrhea   Iodine    Penicillins    Shellfish Allergy    Statins Other (See Comments)    High LFT's   Strawberry (Diagnostic) Itching    Chief Complaint  Patient presents with   Medical Management of Chronic Issues    Patinet presents today for a 6 month follow-up.   Quality Metric Gaps    Flu, COVID booster     HPI: Patient is a 75 y.o. female seen today for medical management of chronic conditions.   Labs discussed with patient.   Reports having covid 05/2021. Denies prolonged symptoms. Received covid bivariant booster 10/03/2021 at [facility].   Cholesterol improved. Continues to take Crestor daily. Follows Davidson fat diet.   Seasonal allergies have not subsided. Continues to take Xyzal and Flonase daily.   Prediabetes discussed. A1c unchanged from last visit. Follows diet Davidson in carbs and sugars.   Continues to be active and exercises a few times weekly. Reports some joint pain in the morning. Described as "stiffness" but subsides by evening.   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 Reports feeling sad at first, but coping well.   Neck pain improved since adjusting bed incline.   Mammogram scheduled 11/09/2021.   Annual eye exam scheduled 12/21/2020.   Flu vaccine 08/30/2021 at [facility].    Review of Systems:  Review of Systems  Constitutional:  Negative for chills, fever, malaise/fatigue and weight loss.  HENT: Negative.    Eyes:  Negative for discharge and redness.  Respiratory:  Negative for cough, shortness of breath and wheezing.   Cardiovascular:  Negative for chest  pain and leg swelling.  Gastrointestinal:  Negative for abdominal pain, constipation, diarrhea, heartburn, nausea and vomiting.  Genitourinary:  Negative for dysuria and frequency.  Musculoskeletal:  Positive for joint pain and neck pain. Negative for falls and myalgias.  Skin: Negative.   Neurological:  Negative for dizziness, weakness and headaches.  Endo/Heme/Allergies:  Positive for environmental allergies.  Psychiatric/Behavioral:  Negative for depression. The patient is not nervous/anxious.    Past Medical History:  Diagnosis Date   Disorder of bone and cartilage, unspecified    Disorders of bursae and tendons in shoulder region, unspecified    Diverticulitis of colon (without mention of hemorrhage)(562.11)    Essential hypertension, benign    Ganglion of tendon sheath    Lumbago    Other abnormal blood chemistry    Other and unspecified hyperlipidemia    Premenopausal menorrhagia    Sebaceous cyst    Shortness of breath    Past Surgical History:  Procedure Laterality Date   ROTATOR CUFF REPAIR Left 10/06/13   tendon and ligament repair   Social History:   reports that she has quit smoking. Her smoking use included cigarettes. She started smoking about 52 years ago. She has a 5.00 pack-year smoking history. She has never used smokeless tobacco. She reports that she does not drink alcohol and does not use drugs.  Family History  Problem Relation Age of Onset   Lung cancer Mother    Osteoporosis Mother    Coronary artery disease Father  Medications: Patient's Medications  New Prescriptions   No medications on file  Previous Medications   ASPIRIN 81 MG TABLET    Take 81 mg by mouth daily.    B COMPLEX VITAMINS (B COMPLEX PO)    Take 1 tablet by mouth daily.   CALCIUM CARBONATE (OSCAL) 1500 (600 CA) MG TABS TABLET    Take 1 tablet by mouth 2 (two) times daily with a meal.   CHOLECALCIFEROL (VITAMIN D) 400 UNITS TABS TABLET    Take 400 Units by mouth daily.     FLUTICASONE (FLONASE) 50 MCG/ACT NASAL SPRAY    Place 2 sprays into both nostrils daily.   GLUCOSAMINE-CHONDROIT-VIT C-MN (GLUCOSAMINE CHONDR 1500 COMPLX PO)    Take by mouth daily.   GUAIFENESIN (MUCINEX) 600 MG 12 HR TABLET    Take 600 mg by mouth daily.    LISINOPRIL-HYDROCHLOROTHIAZIDE (ZESTORETIC) 10-12.5 MG TABLET    TAKE 1 TABLET DAILY   LORATADINE 10 MG CAPS    Take 1 capsule by mouth as needed.   MULTIPLE VITAMINS-MINERALS (MULTIVITAMIN WITH MINERALS) TABLET    Take 1 tablet by mouth daily.   PROBIOTIC PRODUCT (PROBIOTIC DAILY PO)    Take 1 tablet by mouth daily.    ROSUVASTATIN (CRESTOR) 5 MG TABLET    TAKE ONE-HALF (1/2) TABLET AT BEDTIME ON MONDAY, WEDNESDAY, AND FRIDAY (AVOID GRAPEFRUIT PRODUCTS)   VITAMIN C (ASCORBIC ACID) 500 MG TABLET    Take 500 mg by mouth daily.  Modified Medications   No medications on file  Discontinued Medications   No medications on file    Physical Exam:  Vitals:   10/27/21 1053  BP: (!) 118/56  Pulse: 83  Temp: (!) 96.8 F (36 C)  SpO2: 98%  Weight: 114 lb 12.8 oz (52.1 kg)  Height: 5\' 1"  (1.549 m)   Body mass index is 21.69 kg/m. Wt Readings from Last 3 Encounters:  10/27/21 114 lb 12.8 oz (52.1 kg)  04/21/21 117 lb 6.4 oz (53.3 kg)  10/18/20 116 lb 6.4 oz (52.8 kg)    Physical Exam Vitals reviewed.  Constitutional:      General: She is not in acute distress. HENT:     Head: Normocephalic.  Eyes:     General:        Right eye: No discharge.        Left eye: No discharge.  Cardiovascular:     Rate and Rhythm: Normal rate and regular rhythm.     Pulses: Normal pulses.     Heart sounds: Normal heart sounds. No murmur heard. Pulmonary:     Effort: Pulmonary effort is normal. No respiratory distress.     Breath sounds: Normal breath sounds. No wheezing.  Abdominal:     General: Bowel sounds are normal. There is no distension.     Palpations: Abdomen is soft.     Tenderness: There is no abdominal tenderness.   Musculoskeletal:     Right lower leg: No edema.     Left lower leg: No edema.  Skin:    General: Skin is warm and dry.     Capillary Refill: Capillary refill takes less than 2 seconds.  Neurological:     General: No focal deficit present.     Mental Status: She is alert and oriented to person, place, and time.  Psychiatric:        Mood and Affect: Mood normal.        Behavior: Behavior normal.    Labs reviewed: Basic  Metabolic Panel: Recent Labs    04/14/21 0916 10/25/21 0913  NA 138 139  K 3.8 3.9  CL 101 101  CO2 29 28  GLUCOSE 96 92  BUN 25 17  CREATININE 0.87 0.84  CALCIUM 10.0 9.2   Liver Function Tests: Recent Labs    10/25/21 0913  AST 17  ALT 15  BILITOT 0.4  PROT 7.3   No results for input(s): LIPASE, AMYLASE in the last 8760 hours. No results for input(s): AMMONIA in the last 8760 hours. CBC: Recent Labs    10/25/21 0913  WBC 7.0  NEUTROABS 2,814  HGB 14.1  HCT 41.7  MCV 91.0  PLT 212   Lipid Panel: Recent Labs    04/14/21 0916 10/25/21 0913  CHOL 215* 207*  HDL 69 67  LDLCALC 128* 117*  TRIG 82 118  CHOLHDL 3.1 3.1   TSH: No results for input(s): TSH in the last 8760 hours. A1C: Lab Results  Component Value Date   HGBA1C 5.8 (H) 10/25/2021     Assessment/Plan 1. Pure hypercholesterolemia - LDL 117, improved, goal < 845 - cont Crestor   2. Hyperglycemia - A1c 5.8 10/25/2021 - diet controlled - cont to limit carbs and sugars in diet - eye exam 12/21/2020  3. Essential hypertension, benign - controlled - cont lisinopril-HCTZ  4. Atherosclerosis of aortic arch (HCC) - cont asa and statin  5. Screening mammogram for breast cancer - scheduled 11/09/2021  6. Neck pain - improved   7. Seasonal allergies - cont Xyzal and Flonase  8. 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 Greater then 50% of total time spent doing patient education regarding health  maintenance and symptom management.    Next appt:Visit date not found  Jaquavious Mercer Coletta Memos, [facility]  [facility] & Adult Medicine [phone]

## 2021-11-25 IMAGING — CR DG HAND COMPLETE 3+V*R*
3 series · 3 of 3 positions shown · non-contrast
Comparison: Left hand radiograph dated 04/15/2020.

CLINICAL DATA: 74-year-old female with chronic right hand pain.

EXAM:
RIGHT HAND - COMPLETE 3+ VIEW

[x hand pa right]
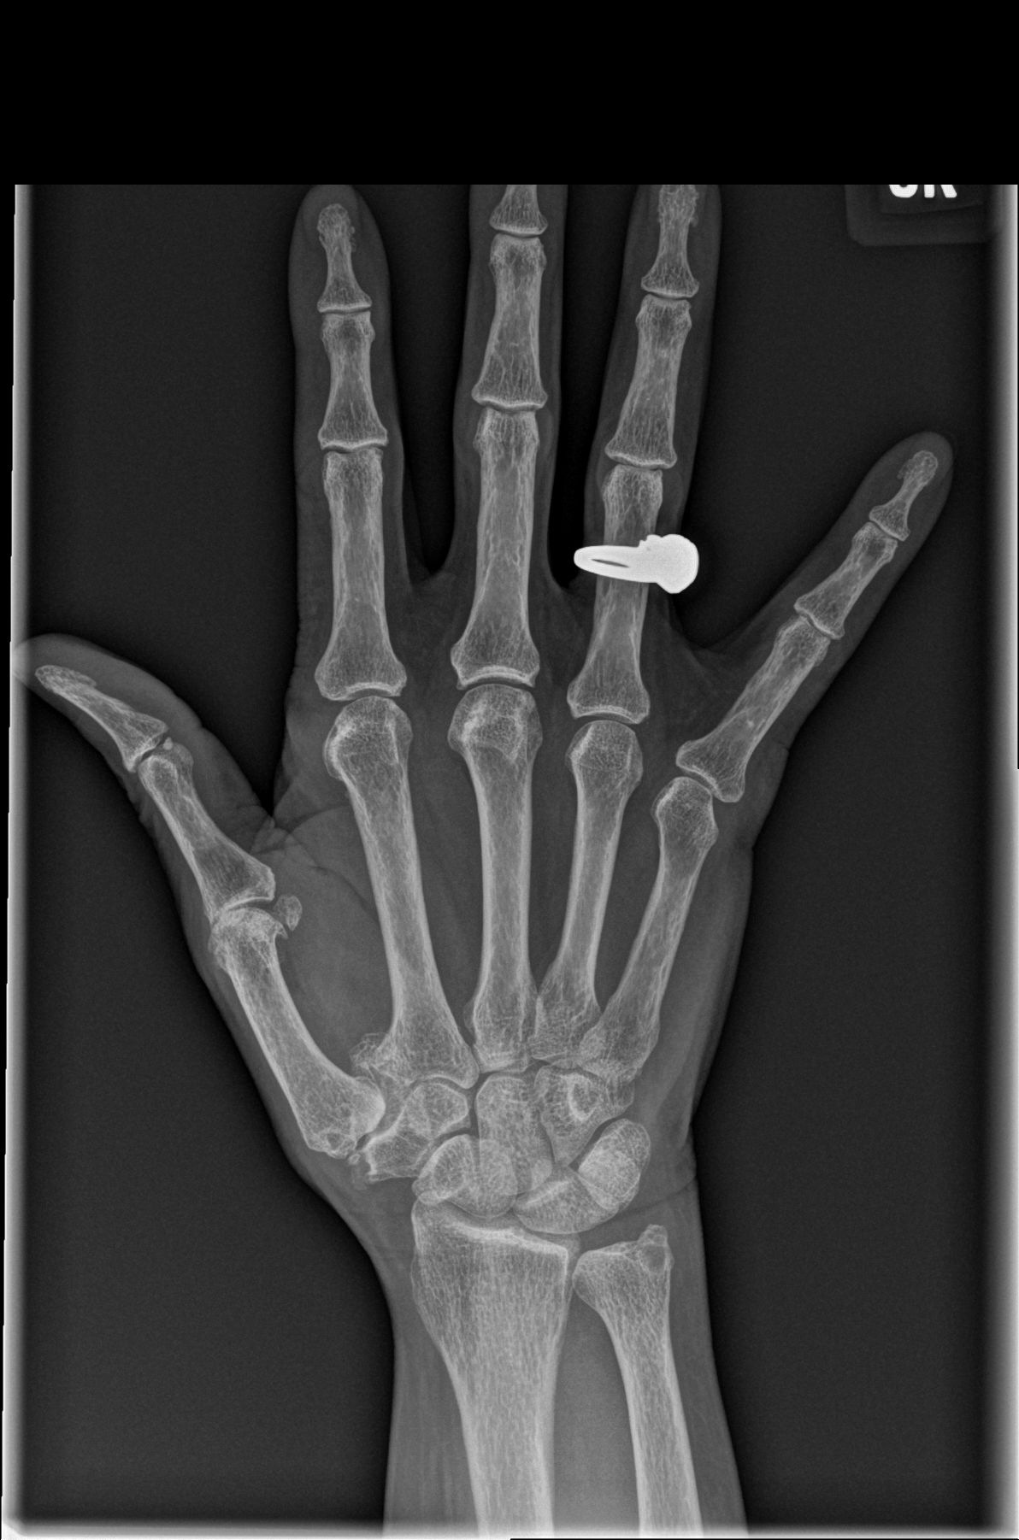

[x hand obl right]
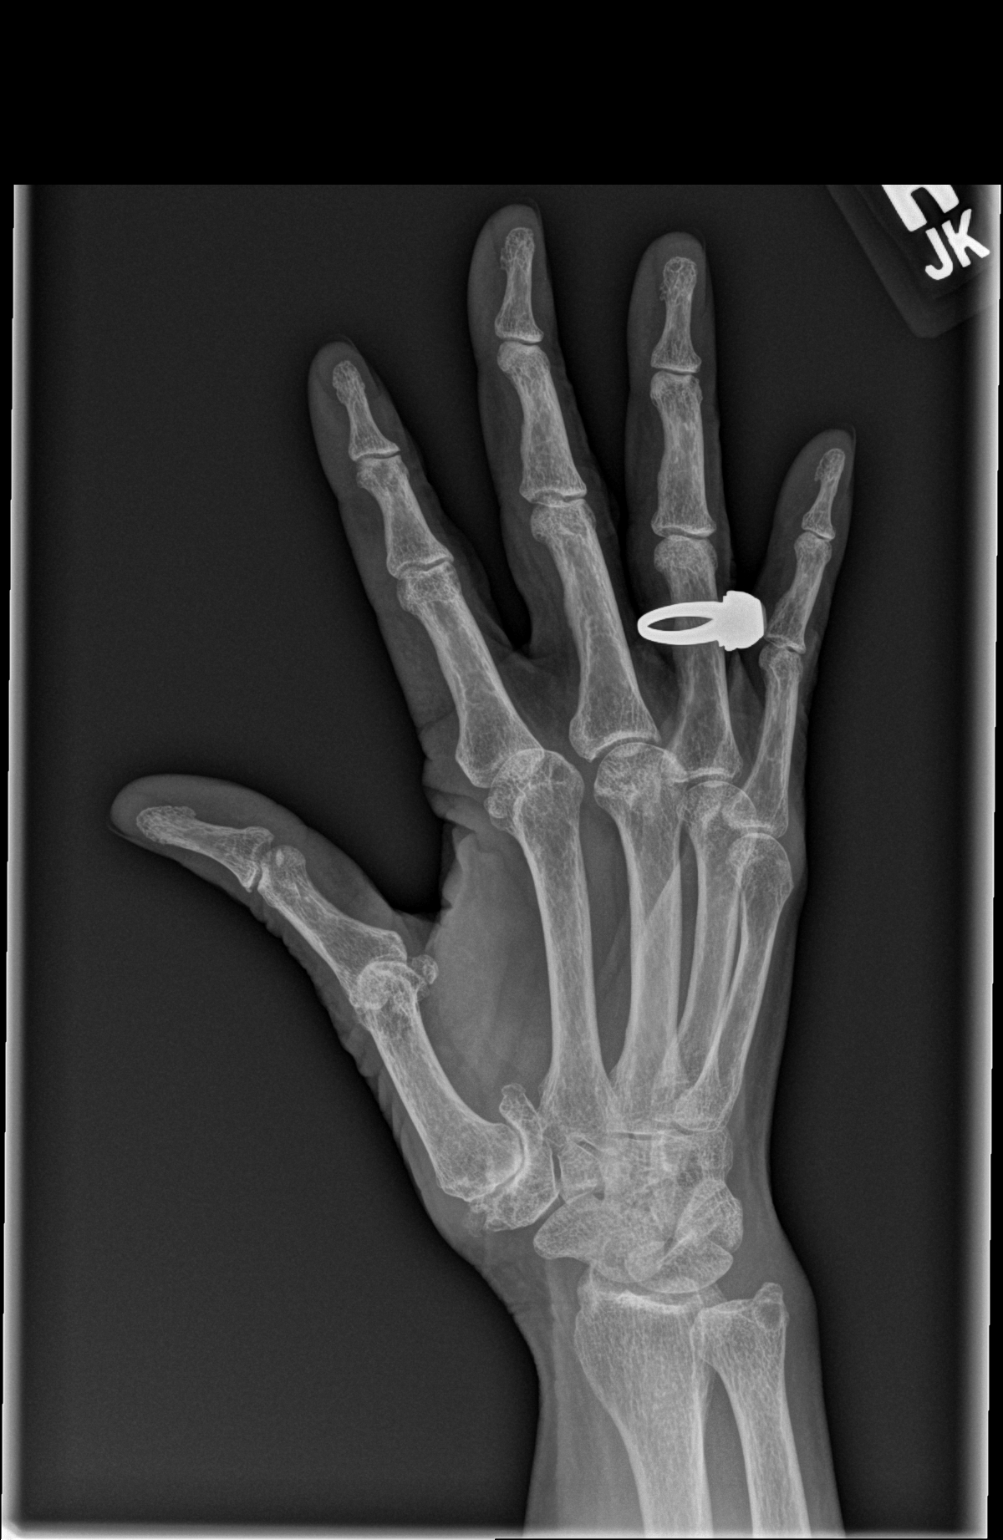

[x hand lat right]
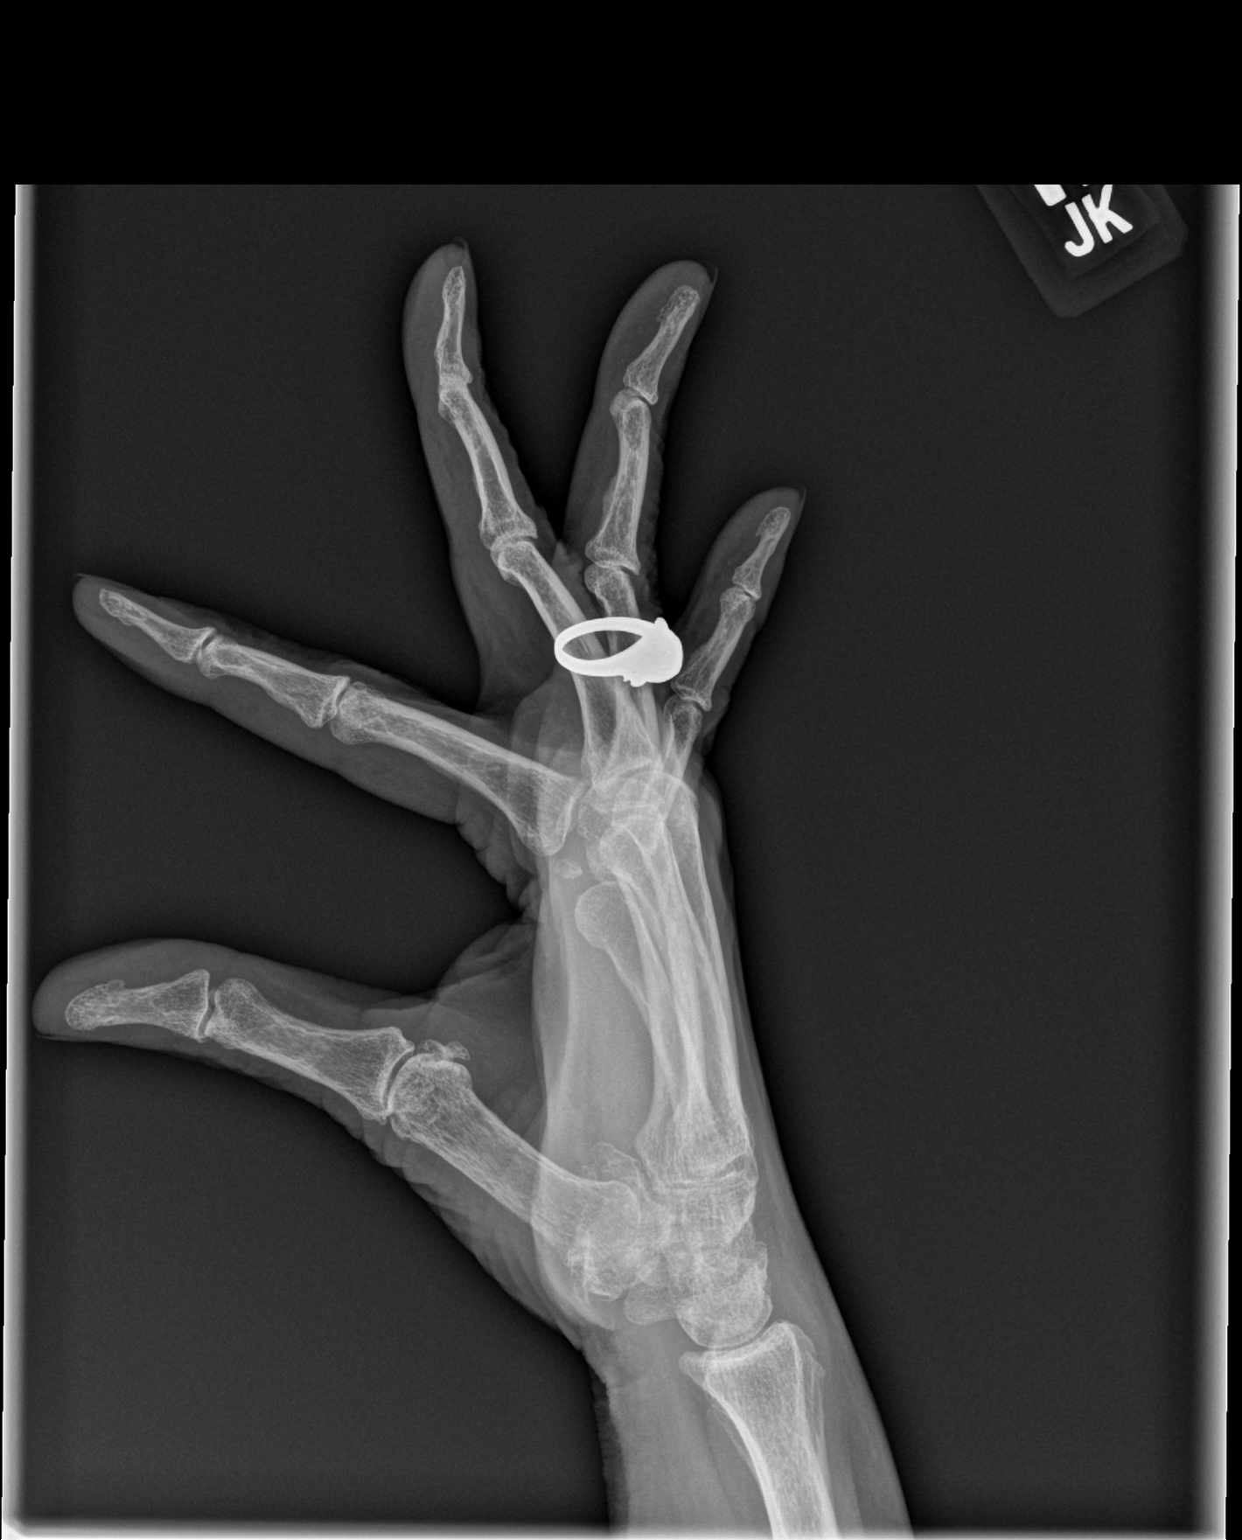

[3 of 3 positions shown; findings below may reference images not displayed]

FINDINGS: There is no acute fracture or dislocation. The bones are osteopenic.
There is osteoarthritic changes of the base of the thumb. Moderate
arthritic changes of the first MCP joint. There is small cystic
changes of the ulnar styloid. The soft tissues are unremarkable.
IMPRESSION: 1. No acute fracture or dislocation.
2. Osteoarthritic changes of the base of the thumb and first MCP
joint.

## 2021-12-28 ENCOUNTER — Other Ambulatory Visit: Payer: Self-pay | Admitting: *Deleted

## 2021-12-28 MED ORDER — LISINOPRIL-HYDROCHLOROTHIAZIDE 10-12.5 MG PO TABS: 1.0000 | ORAL_TABLET | Freq: Every day | ORAL | 3 refills | Status: DC

## 2021-12-28 NOTE — Telephone Encounter (Signed)
Patient requested refill Requested to be sent to [facility].  

## 2022-03-08 ENCOUNTER — Other Ambulatory Visit: Payer: Self-pay | Admitting: Orthopedic Surgery

## 2022-03-08 DIAGNOSIS — R739 Hyperglycemia, unspecified: Secondary | ICD-10-CM

## 2022-03-08 DIAGNOSIS — I10 Essential (primary) hypertension: Secondary | ICD-10-CM

## 2022-03-08 DIAGNOSIS — E7800 Pure hypercholesterolemia, unspecified: Secondary | ICD-10-CM

## 2022-04-20 ENCOUNTER — Encounter: Payer: Medicare HMO | Admitting: Family

## 2022-04-21 ENCOUNTER — Ambulatory Visit (INDEPENDENT_AMBULATORY_CARE_PROVIDER_SITE_OTHER): Payer: Medicare HMO | Admitting: Family

## 2022-04-21 ENCOUNTER — Encounter: Payer: Self-pay | Admitting: Family

## 2022-04-21 DIAGNOSIS — Z0000 Encounter for general adult medical examination without abnormal findings: Secondary | ICD-10-CM

## 2022-05-08 ENCOUNTER — Other Ambulatory Visit: Payer: Medicare HMO

## 2022-05-08 DIAGNOSIS — I10 Essential (primary) hypertension: Secondary | ICD-10-CM

## 2022-05-08 DIAGNOSIS — R739 Hyperglycemia, unspecified: Secondary | ICD-10-CM

## 2022-05-08 DIAGNOSIS — E7800 Pure hypercholesterolemia, unspecified: Secondary | ICD-10-CM

## 2022-05-09 LAB — CBC WITH DIFFERENTIAL/PLATELET
Absolute Monocytes: 449 cells/uL (ref 200–950)
Basophils Absolute: 20 cells/uL (ref 0–200)
Basophils Relative: 0.3 %
Eosinophils Absolute: 111 cells/uL (ref 15–500)
Eosinophils Relative: 1.7 %
HCT: 41.5 % (ref 35.0–45.0)
Hemoglobin: 13.9 g/dL (ref 11.7–15.5)
Lymphs Abs: 3179 cells/uL (ref 850–3900)
MCH: 30.5 pg (ref 27.0–33.0)
MCHC: 33.5 g/dL (ref 32.0–36.0)
MCV: 91 fL (ref 80.0–100.0)
MPV: 9.7 fL (ref 7.5–12.5)
Monocytes Relative: 6.9 %
Neutro Abs: 2743 cells/uL (ref 1500–7800)
Neutrophils Relative %: 42.2 %
Platelets: 240 10*3/uL (ref 140–400)
RBC: 4.56 10*6/uL (ref 3.80–5.10)
RDW: 12.8 % (ref 11.0–15.0)
Total Lymphocyte: 48.9 %
WBC: 6.5 10*3/uL (ref 3.8–10.8)

## 2022-05-09 LAB — COMPREHENSIVE METABOLIC PANEL
AG Ratio: 1.4 (calc) (ref 1.0–2.5)
ALT: 12 U/L (ref 6–29)
AST: 15 U/L (ref 10–35)
Albumin: 4.3 g/dL (ref 3.6–5.1)
Alkaline phosphatase (APISO): 85 U/L (ref 37–153)
BUN: 15 mg/dL (ref 7–25)
CO2: 27 mmol/L (ref 20–32)
Calcium: 9.5 mg/dL (ref 8.6–10.4)
Chloride: 103 mmol/L (ref 98–110)
Creat: 0.88 mg/dL (ref 0.60–1.00)
Globulin: 3 g/dL (calc) (ref 1.9–3.7)
Glucose, Bld: 100 mg/dL — ABNORMAL HIGH (ref 65–99)
Potassium: 4.1 mmol/L (ref 3.5–5.3)
Sodium: 140 mmol/L (ref 135–146)
Total Bilirubin: 0.4 mg/dL (ref 0.2–1.2)
Total Protein: 7.3 g/dL (ref 6.1–8.1)

## 2022-05-09 LAB — HEMOGLOBIN A1C
Hgb A1c MFr Bld: 5.6 % of total Hgb (ref <5.7)
Mean Plasma Glucose: 114 mg/dL
eAG (mmol/L): 6.3 mmol/L

## 2022-05-09 LAB — LIPID PANEL
Cholesterol: 179 mg/dL (ref <200)
HDL: 60 mg/dL (ref >=50)
LDL Cholesterol (Calc): 97 mg/dL (calc)
Non-HDL Cholesterol (Calc): 119 mg/dL (calc) (ref <130)
Total CHOL/HDL Ratio: 3 (calc) (ref <5.0)
Triglycerides: 120 mg/dL (ref <150)

## 2022-05-11 ENCOUNTER — Encounter: Payer: Self-pay | Admitting: Orthopedic Surgery

## 2022-05-11 ENCOUNTER — Ambulatory Visit (INDEPENDENT_AMBULATORY_CARE_PROVIDER_SITE_OTHER): Payer: Medicare HMO | Admitting: Orthopedic Surgery

## 2022-05-11 VITALS — BP 110/60 | HR 72 | Temp 97.1°F | Resp 16 | Ht 61.0 in | Wt 118.0 lb

## 2022-05-11 DIAGNOSIS — J302 Other seasonal allergic rhinitis: Secondary | ICD-10-CM | POA: Diagnosis not present

## 2022-05-11 DIAGNOSIS — Q6689 Other  specified congenital deformities of feet: Secondary | ICD-10-CM

## 2022-05-11 DIAGNOSIS — I10 Essential (primary) hypertension: Secondary | ICD-10-CM

## 2022-05-11 DIAGNOSIS — E7800 Pure hypercholesterolemia, unspecified: Secondary | ICD-10-CM | POA: Diagnosis not present

## 2022-05-11 DIAGNOSIS — I700 Atherosclerosis of aorta: Secondary | ICD-10-CM

## 2022-05-11 DIAGNOSIS — R739 Hyperglycemia, unspecified: Secondary | ICD-10-CM

## 2022-05-11 NOTE — Progress Notes (Signed)
Careteam: Patient Care Team: Tamara Heir, NP as PCP - General (Adult Health Nurse Practitioner) Tamara Low, MD as Consulting Physician (Orthopedic Surgery) Mateo Flow, MD as Consulting Physician (Ophthalmology)  Seen by: Hazle Nordmann, AGNP-C  PLACE OF SERVICE:  [facility]  Advanced Directive information Does Patient Have a Medical Advance Directive?: Yes, Type of Advance Directive: Healthcare Power of [place];Living will, Does patient want to make changes to medical advance directive?: No - Patient declined  Allergies  Allergen Reactions   Chocolate Diarrhea   Chocolate Flavor    Iodine    Penicillins    Povidone Iodine    Shellfish Allergy    Statins Other (See Comments)    High LFT's   Strawberry (Diagnostic) Itching   Strawberry Extract     Chief Complaint  Patient presents with   Medical Management of Chronic Issues    Patient is here for a  38M follow up for chronic conditions      HPI: Patient is a 76 y.o. female seen today for medical management of chronic conditions.   Labs reviewed with patient.   No health complaints today.  Great toes are beginning to overlap on to 2nd toe, R>L. She had [place]iatrist [place] of state in past. She is beginning to have mils pain, increased when she exercises. She is using a toe spacer. No skin breakdown to feet. Would like podiatry referral.   Increased allergies in summer months. Taking Xyzal and Azelastine nasal spray.   A1c 5.6 05/08/2022. Eye exam 04/19/2022. No complaints of hypoglycemia. Remains on asa, lisinopril-HCTZ, statin.   LDL improved, < 100. Reports eating less cheese.   No recent falls or injuries.   Review of Systems:  Review of Systems  Constitutional: Negative.   HENT: Negative.    Eyes: Negative.   Respiratory:  Negative for cough, shortness of breath and wheezing.   Cardiovascular:  Negative for chest pain and leg swelling.  Genitourinary: Negative.   Musculoskeletal:  Negative for  falls.       Foot pain  Skin: Negative.   Neurological: Negative.   Endo/Heme/Allergies:  Positive for environmental allergies.  Psychiatric/Behavioral:  Negative for depression and memory loss. The patient is not nervous/anxious and does not have insomnia.     Past Medical History:  Diagnosis Date   Disorder of bone and cartilage, unspecified    Disorders of bursae and tendons in shoulder region, unspecified    Diverticulitis of colon (without mention of hemorrhage)(562.11)    Essential hypertension, benign    Ganglion of tendon sheath    Lumbago    Other abnormal blood chemistry    Other and unspecified hyperlipidemia    Premenopausal menorrhagia    Sebaceous cyst    Shortness of breath    Past Surgical History:  Procedure Laterality Date   ROTATOR CUFF REPAIR Left 10/06/13   tendon and ligament repair   Social History:   reports that she has quit smoking. Her smoking use included cigarettes. She started smoking about 52 years ago. She has a 5.00 pack-year smoking history. She has never used smokeless tobacco. She reports that she does not drink alcohol and does not use drugs.  Family History  Problem Relation Age of Onset   Lung cancer Mother    Osteoporosis Mother    Coronary artery disease Father     Medications: Patient's Medications  New Prescriptions   No medications on file  Previous Medications   ASPIRIN 81 MG TABLET  Take 81 mg by mouth daily.    AZELASTINE HCL (ASTEPRO) 0.15 % SOLN    Place 2 sprays into both nostrils every morning.   B COMPLEX VITAMINS (B COMPLEX PO)    Take 1 tablet by mouth daily.   CALCIUM CARBONATE (OSCAL) 1500 (600 CA) MG TABS TABLET    Take 1 tablet by mouth 2 (two) times daily with a meal.   CHOLECALCIFEROL (VITAMIN D) 400 UNITS TABS TABLET    Take 400 Units by mouth daily.    GLUCOSAMINE-CHONDROIT-VIT C-MN (GLUCOSAMINE CHONDR 1500 COMPLX PO)    Take by mouth daily.   GUAIFENESIN (MUCINEX) 600 MG 12 HR TABLET    Take 600 mg by  mouth daily.    LEVOCETIRIZINE (XYZAL) 5 MG TABLET    Take 5 mg by mouth every evening.   LISINOPRIL-HYDROCHLOROTHIAZIDE (ZESTORETIC) 10-12.5 MG TABLET    Take 1 tablet by mouth daily.   LORATADINE 10 MG CAPS    Take 1 capsule by mouth as needed.   MULTIPLE VITAMINS-MINERALS (MULTIVITAMIN WITH MINERALS) TABLET    Take 1 tablet by mouth daily.   PROBIOTIC PRODUCT (PROBIOTIC DAILY PO)    Take 1 tablet by mouth daily.    ROSUVASTATIN (CRESTOR) 5 MG TABLET    TAKE ONE-HALF (1/2) TABLET AT BEDTIME ON MONDAY, WEDNESDAY, AND FRIDAY (AVOID GRAPEFRUIT PRODUCTS)   VITAMIN C (ASCORBIC ACID) 500 MG TABLET    Take 500 mg by mouth daily.  Modified Medications   No medications on file  Discontinued Medications   No medications on file    Physical Exam:  Vitals:   05/11/22 1049  BP: 110/60  Pulse: 72  Resp: 16  Temp: (!) 97.1 F (36.2 C)  TempSrc: Temporal  SpO2: 98%  Weight: 118 lb (53.5 kg)  Height: 5\' 1"  (1.549 m)   Body mass index is 22.3 kg/m. Wt Readings from Last 3 Encounters:  05/11/22 118 lb (53.5 kg)  10/27/21 114 lb 12.8 oz (52.1 kg)  04/21/21 117 lb 6.4 oz (53.3 kg)    Physical Exam Vitals reviewed.  Constitutional:      General: She is not in acute distress. HENT:     Head: Normocephalic.  Eyes:     General:        Right eye: No discharge.        Left eye: No discharge.  Cardiovascular:     Rate and Rhythm: Normal rate and regular rhythm.     Pulses:          Dorsalis pedis pulses are 1+ on the right side and 1+ on the left side.       Posterior tibial pulses are 1+ on the right side and 1+ on the left side.     Heart sounds: Normal heart sounds. No murmur heard. Pulmonary:     Effort: Pulmonary effort is normal. No respiratory distress.     Breath sounds: Normal breath sounds. No wheezing.  Abdominal:     General: Bowel sounds are normal. There is no distension.     Palpations: Abdomen is soft.     Tenderness: There is no abdominal tenderness.   Musculoskeletal:     Cervical back: Neck supple.     Right lower leg: No edema.     Left lower leg: No edema.     Right foot: Deformity present.     Left foot: Deformity present.  Feet:     Right foot:     Protective Sensation:   10  sites sensed.     Skin integrity: Skin integrity normal.     Toenail Condition: Right toenails are abnormally thick.     Left foot:     Protective Sensation:   10 sites sensed.     Skin integrity: Skin integrity normal.     Toenail Condition: Left toenails are abnormally thick.     Comments: Right great toe overlapping 2nd toe, no skin breakdown, left great toe with deformity Lymphadenopathy:     Cervical: No cervical adenopathy.  Skin:    General: Skin is warm and dry.     Capillary Refill: Capillary refill takes less than 2 seconds.  Neurological:     General: No focal deficit present.     Mental Status: She is alert and oriented to person, place, and time.     Motor: No weakness.     Gait: Gait normal.  Psychiatric:        Mood and Affect: Mood normal.        Behavior: Behavior normal.     Labs reviewed: Basic Metabolic Panel: Recent Labs    10/25/21 0913 05/08/22 0843  NA 139 140  K 3.9 4.1  CL 101 103  CO2 28 27  GLUCOSE 92 100*  BUN 17 15  CREATININE 0.84 0.88  CALCIUM 9.2 9.5   Liver Function Tests: Recent Labs    10/25/21 0913 05/08/22 0843  AST 17 15  ALT 15 12  BILITOT 0.4 0.4  PROT 7.3 7.3   No results for input(s): "LIPASE", "AMYLASE" in the last 8760 hours. No results for input(s): "AMMONIA" in the last 8760 hours. CBC: Recent Labs    10/25/21 0913 05/08/22 0843  WBC 7.0 6.5  NEUTROABS 2,814 2,743  HGB 14.1 13.9  HCT 41.7 41.5  MCV 91.0 91.0  PLT 212 240   Lipid Panel: Recent Labs    10/25/21 0913 05/08/22 0843  CHOL 207* 179  HDL 67 60  LDLCALC 117* 97  TRIG 118 120  CHOLHDL 3.1 3.0   TSH: No results for input(s): "TSH" in the last 8760 hours. A1C: Lab Results  Component Value Date    HGBA1C 5.6 05/08/2022     Assessment/Plan 1. 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 Seasonal allergies - cont Xyzal and Azelastine   3. Essential hypertension, benign - controlled - cont lisinopril-HCTZ  4. Pure hypercholesterolemia - LDL < 100 - cont Crestor  5. Atherosclerosis of aortic arch (HCC) - cont asa and statin  6. Hyperglycemia - glucose 100 05/08/2022 - A1c 5.6 05/08/2022 - cont to limit carbs and sugars in diet  Total time: 31 minutes. Greater than 50% of total time spent doing patient education regarding health maintenance, foot pain, diabetic precautions, symptom/medication management.    Next appt: Visit date not found  Boleslaus Holloway Scherry Ran  [facility] [phone]

## 2022-05-23 ENCOUNTER — Ambulatory Visit (INDEPENDENT_AMBULATORY_CARE_PROVIDER_SITE_OTHER): Payer: Medicare HMO

## 2022-05-23 ENCOUNTER — Ambulatory Visit: Payer: Medicare HMO | Admitting: Podiatry

## 2022-05-23 DIAGNOSIS — M21611 Bunion of right foot: Secondary | ICD-10-CM

## 2022-05-23 DIAGNOSIS — Z79899 Other long term (current) drug therapy: Secondary | ICD-10-CM

## 2022-05-23 DIAGNOSIS — M21612 Bunion of left foot: Secondary | ICD-10-CM

## 2022-05-23 DIAGNOSIS — M21621 Bunionette of right foot: Secondary | ICD-10-CM

## 2022-05-23 DIAGNOSIS — B351 Tinea unguium: Secondary | ICD-10-CM

## 2022-05-23 MED ORDER — TERBINAFINE HCL 250 MG PO TABS: 250.0000 mg | ORAL_TABLET | Freq: Every day | ORAL | 0 refills | Status: DC

## 2022-05-23 NOTE — Progress Notes (Signed)
Subjective:   Patient ID: Tamara Davidson, female   DOB: 76 y.o.   MRN: [id number]   HPI Chief Complaint  Patient presents with   Bunions    Pt came in today for Bilateral bunions, which started 18 years ago, pt rate the pain 8 out of 10, pt is having pain in the top of the foot and on the lateral sides of the foot.    She states that many years ago she saw a podiatrist when the bunion started. At the time it was not bothering her but now they are hurting and her feet hurt. She has tried a toe spacer but it is not heavy enough to hold the toe straight. Right is worse than the left.   Also has nail fungus. She has tried OTC medication, no improvement.  No pain in the nails and no swelling redness or drainage.   Review of Systems  All other systems reviewed and are negative.  Past Medical History:  Diagnosis Date   Disorder of bone and cartilage, unspecified    Disorders of bursae and tendons in shoulder region, unspecified    Diverticulitis of colon (without mention of hemorrhage)(562.11)    Essential hypertension, benign    Ganglion of tendon sheath    Lumbago    Other abnormal blood chemistry    Other and unspecified hyperlipidemia    Premenopausal menorrhagia    Sebaceous cyst    Shortness of breath     Past Surgical History:  Procedure Laterality Date   ROTATOR CUFF REPAIR Left 10/06/13   tendon and ligament repair     Current Outpatient Medications:    terbinafine (LAMISIL) 250 MG tablet, Take 1 tablet (250 mg total) by mouth daily., Disp: 90 tablet, Rfl: 0   aspirin 81 MG tablet, Take 81 mg by mouth daily. , Disp: , Rfl:    Azelastine HCl (ASTEPRO) 0.15 % SOLN, Place 2 sprays into both nostrils every morning., Disp: , Rfl:    B Complex Vitamins (B COMPLEX PO), Take 1 tablet by mouth daily., Disp: , Rfl:    calcium carbonate (OSCAL) 1500 (600 Ca) MG TABS tablet, Take 1 tablet by mouth 2 (two) times daily with a meal., Disp: , Rfl:    cholecalciferol (VITAMIN D) 400  UNITS TABS tablet, Take 400 Units by mouth daily. , Disp: , Rfl:    Glucosamine-Chondroit-Vit C-Mn (GLUCOSAMINE CHONDR 1500 COMPLX PO), Take by mouth daily., Disp: , Rfl:    guaiFENesin (MUCINEX) 600 MG 12 hr tablet, Take 600 mg by mouth daily. , Disp: , Rfl:    levocetirizine (XYZAL) 5 MG tablet, Take 5 mg by mouth every evening., Disp: , Rfl:    lisinopril-hydrochlorothiazide (ZESTORETIC) 10-12.5 MG tablet, Take 1 tablet by mouth daily., Disp: 90 tablet, Rfl: 3   Loratadine 10 MG CAPS, Take 1 capsule by mouth as needed., Disp: , Rfl:    Multiple Vitamins-Minerals (MULTIVITAMIN WITH MINERALS) tablet, Take 1 tablet by mouth daily., Disp: , Rfl:    Probiotic Product (PROBIOTIC DAILY PO), Take 1 tablet by mouth daily. , Disp: , Rfl:    rosuvastatin (CRESTOR) 5 MG tablet, TAKE ONE-HALF (1/2) TABLET AT BEDTIME ON MONDAY, WEDNESDAY, AND FRIDAY (AVOID GRAPEFRUIT PRODUCTS), Disp: 45 tablet, Rfl: 1   vitamin C (ASCORBIC ACID) 500 MG tablet, Take 500 mg by mouth daily., Disp: , Rfl:   Allergies  Allergen Reactions   Chocolate Diarrhea   Chocolate Flavor    Iodine    Penicillins  Povidone Iodine    Shellfish Allergy    Statins Other (See Comments)    High LFT's   Strawberry (Diagnostic) Itching   Strawberry Extract            Objective:  Physical Exam  General: AAO x3, NAD  Dermatological: Nails are hypertrophic, dystrophic with yellow, brown discoloration.  No hyperpigmentation of the surrounding skin.  No edema, erythema.  No open lesions.  Vascular: Dorsalis Pedis artery and Posterior Tibial artery pedal pulses are 2/4 bilateral with immedate capillary fill time. There is no pain with calf compression, swelling, warmth, erythema.   Neruologic: Grossly intact via light touch bilateral.   Musculoskeletal: Severe bunions present right foot worse than left.  There is bunion is also present.  Tenderness palpation along both the bunion and tailor's bunion sites.  Minimal edema along the  first and fifth metatarsal heads but there is no erythema or warmth.  Muscular strength 5/5 in all groups tested bilateral.  Gait: Unassisted, Nonantalgic.       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  3 views of bilateral feet were obtained.  Severe bunions present.  Subluxation of the first MPJ. -Discussed with conservative as well as surgical treatment options.  Wants to proceed with surgical intervention.  Discussed with her given the severe bunion first MPJ arthrodesis as well as tailor's bunionectomy and she agrees and wishes to proceed with this. The incision placement as well as the postoperative course was discussed with the patient. I discussed risks of the surgery which include, but not limited to, infection, bleeding, pain, swelling, need for further surgery, delayed or nonhealing, painful or ugly scar, numbness or sensation changes, over/under correction, recurrence, transfer lesions, further deformity, hardware failure, DVT/PE, loss of toe/foot. Patient understands these risks and wishes to proceed with surgery. The surgical consent was reviewed with the patient all 3 pages were signed. No promises or guarantees were given to the outcome of the procedure. All questions were answered to the best of my ability. Before the surgery the patient was encouraged to call the office if there is any further questions. The surgery will be performed at the [facility] on an outpatient basis. -Boot splints for postoperative use, immobilization -Discussed some treatment for nail fungus.  She wants to proceed with oral Lamisil.  Blood work previously done.  Discussed side effects of Lamisil.  We will recheck blood work in 6 weeks  Tamara Davidson DPM

## 2022-05-23 NOTE — Patient Instructions (Addendum)
Pre-Operative Instructions  Congratulations, you have decided to take an important step to improving your quality of life.  You can be assured that the doctors of [facility] will be with you every step of the way.  Plan to be at the surgery center/hospital at least 1 (one) hour prior to your scheduled time unless otherwise directed by the surgical center/hospital staff.  You must have a responsible adult accompany you, remain during the surgery and drive you home.  Make sure you have directions to the surgical center/hospital and know how to get there on time. For hospital based surgery you will need to obtain a history and physical form from your family physician within 1 month prior to the date of surgery- we will give you a form for you primary physician.  We make every effort to accommodate the date you request for surgery.  There are however, times where surgery dates or times have to be moved.  We will contact you as soon as possible if a change in schedule is required.   No Aspirin/Ibuprofen for one week before surgery.  If you are on aspirin, any non-steroidal anti-inflammatory medications (Mobic, Aleve, Ibuprofen) you should stop taking it 7 days prior to your surgery.  You make take Tylenol  For pain prior to surgery.  Medications- If you are taking daily heart and blood pressure medications, seizure, reflux, allergy, asthma, anxiety, pain or diabetes medications, make sure the surgery center/hospital is aware before the day of surgery so they may notify you which medications to take or avoid the day of surgery. No food or drink after midnight the night before surgery unless directed otherwise by surgical center/hospital staff. No alcoholic beverages 24 hours prior to surgery.  No smoking 24 hours prior to or 24 hours after surgery. Wear loose pants or shorts- loose enough to fit over bandages, boots, and casts. No slip on shoes, sneakers are best. Bring your boot with you to the  surgery center/hospital.  Also bring crutches or a walker if your physician has prescribed it for you.  If you do not have this equipment, it will be provided for you after surgery. If you have not been contracted by the surgery center/hospital by the day before your surgery, call to confirm the date and time of your surgery. Leave-time from work may vary depending on the type of surgery you have.  Appropriate arrangements should be made prior to surgery with your employer. Prescriptions will be provided immediately following surgery by your doctor.  Have these filled as soon as possible after surgery and take the medication as directed. Remove nail polish on the operative foot. Wash the night before surgery.  The night before surgery wash the foot and leg well with the antibacterial soap provided and water paying special attention to beneath the toenails and in between the toes.  Rinse thoroughly with water and dry well with a towel.  Perform this wash unless told not to do so by your physician.  Enclosed: 1 Ice pack (please put in freezer the night before surgery)   1 Hibiclens skin cleaner   Pre-op Instructions  If you have any questions regarding the instructions, do not hesitate to call our office at any point during this process.   [place]: [address] [place], [place] [address] [phone]  [place]: [address] [place], [place] [address] [phone]  Dr. Ovid Curd, DPM   Terbutaline Tablets What is this medication? TERBUTALINE (ter BYOO ta leen) treats lung diseases,  such as asthma, where the airways in the lungs narrow, causing breathing problems or wheezing (bronchospasm). It works by opening the airways of the lungs, making it easier to breathe. This medicine may be used for other purposes; ask your health care provider or pharmacist if you have questions. COMMON BRAND NAME(S): Brethine What should I tell my care team before I take this medication? They need to  know if you have any of the following conditions: Diabetes (high blood sugar) Heart disease High blood pressure Irregular heartbeat or rhythm Pheochromocytoma Seizures Thyroid disease An unusual or allergic reaction to terbutaline, other medications, foods, dyes, or preservatives Pregnant or trying to get pregnant Breast-feeding How should I use this medication? Take this medication by mouth with a full glass of water. Take it as directed on the prescription label. Keep taking it unless your care team tells you to stop. Talk to your care team about the use of this medication in children. Special care may be needed. Overdosage: If you think you have taken too much of this medicine contact a poison control center or emergency room at once. NOTE: This medicine is only for you. Do not share this medicine with others. What if I miss a dose? If you take this medication on a regular basis, take it as soon as you can. If it is almost time for your next dose, take only that dose. Do not take double or extra doses. What may interact with this medication? Do not take this medication with any of the following: Cocaine MAOIs like Carbex, Eldepryl, Marplan, Nardil, and Parnate Probucol Procarbazine This medication may also interact with the following: Heart medications Medications for colds or breathing difficulties Medications for high blood pressure Some medications for mental depression or sleep Stimulants This list may not describe all possible interactions. Give your health care provider a list of all the medicines, herbs, non-prescription drugs, or dietary supplements you use. Also tell them if you smoke, drink alcohol, or use illegal drugs. Some items may interact with your medicine. What should I watch for while using this medication? Visit your care team for regular checks on your progress. Tell your care team if your symptoms do not start to get better or if they get worse. You and your  care team should develop an Asthma Action Plan that is just for you. Be sure to know what to do if you are in the yellow (asthma is getting worse) or red (medical alert) zones. Your mouth may get dry. Chewing sugarless gum or sucking hard candy and drinking plenty of water may help. Contact your care team if the problem does not go away or is severe. What side effects may I notice from receiving this medication? Side effects that you should report to your care team as soon as possible: Allergic reactions--skin rash, itching, hives, swelling of the face, lips, tongue, or throat Heart rhythm changes--fast or irregular heartbeat, dizziness, feeling faint or lightheaded, chest pain, trouble breathing Increase in blood pressure Muscle pain or cramps Seizures Side effects that usually do not require medical attention (report to your care team if they continue or are bothersome): Anxiety, nervousness Drowsiness Headache Nausea Tremors or shaking Trouble sleeping This list may not describe all possible side effects. Call your doctor for medical advice about side effects. You may report side effects to FDA at 1-800-FDA-1088. Where should I keep my medication? Keep out of the reach of children and pets. Store at room temperature between 15 and 30 degrees  C (59 and 86 degrees F). Protect from light. Keep the container tightly closed. Throw away any unused medication after the expiration date. NOTE: This sheet is a summary. It may not cover all possible information. If you have questions about this medicine, talk to your doctor, pharmacist, or health care provider.  2023 Elsevier/Gold Standard (2021-08-05 00:00:00)

## 2022-05-25 ENCOUNTER — Telehealth: Payer: Self-pay | Admitting: Urology

## 2022-05-25 NOTE — Telephone Encounter (Signed)
DOS - 06/07/22  Tamara Davidson RIGHT --- [id number] HALLUX MPJ FUSION RIGHT --- [id number]  [employer] EFFECTIVE DATE - 10/30/20  PLAN DEDUCTIBLE - $250.00 W/ $226.32 REMAINING OUT OF POCKET - $3,000.00 W/ $1,856.31 REMAINING COINSURANCE - 10% COPAY - $0.00   SPOKE WITH [name] WITH [employer] AND HE STATED THAT FOR CPT CODES 49702 AND 28750 NO PRIOR AUTH IS REQUIRED.  REF # [id number]

## 2022-06-01 HISTORY — PX: DG FOOT RIGHT COMPLETE (ARMC HX): HXRAD1522

## 2022-06-07 ENCOUNTER — Encounter: Payer: Self-pay | Admitting: Podiatry

## 2022-06-07 ENCOUNTER — Other Ambulatory Visit: Payer: Self-pay | Admitting: Podiatry

## 2022-06-07 DIAGNOSIS — M2011 Hallux valgus (acquired), right foot: Secondary | ICD-10-CM | POA: Diagnosis not present

## 2022-06-07 DIAGNOSIS — M13871 Other specified arthritis, right ankle and foot: Secondary | ICD-10-CM | POA: Diagnosis not present

## 2022-06-07 MED ORDER — CLINDAMYCIN HCL 300 MG PO CAPS: 300.0000 mg | ORAL_CAPSULE | Freq: Three times a day (TID) | ORAL | 0 refills | Status: DC

## 2022-06-07 MED ORDER — OXYCODONE-ACETAMINOPHEN 5-325 MG PO TABS: 1.0000 | ORAL_TABLET | Freq: Four times a day (QID) | ORAL | 0 refills | Status: DC | PRN

## 2022-06-07 MED ORDER — PROMETHAZINE HCL 25 MG PO TABS: 25.0000 mg | ORAL_TABLET | Freq: Three times a day (TID) | ORAL | 0 refills | Status: DC | PRN

## 2022-06-07 NOTE — Progress Notes (Signed)
Post op medications sent to pharmacy 

## 2022-06-09 ENCOUNTER — Other Ambulatory Visit: Payer: Self-pay | Admitting: Podiatry

## 2022-06-09 DIAGNOSIS — M21611 Bunion of right foot: Secondary | ICD-10-CM

## 2022-06-09 NOTE — Progress Notes (Signed)
Knee scooter

## 2022-06-12 ENCOUNTER — Encounter: Payer: Medicare HMO | Admitting: Podiatry

## 2022-06-12 ENCOUNTER — Ambulatory Visit (INDEPENDENT_AMBULATORY_CARE_PROVIDER_SITE_OTHER): Payer: Medicare HMO | Admitting: Podiatry

## 2022-06-12 ENCOUNTER — Ambulatory Visit (INDEPENDENT_AMBULATORY_CARE_PROVIDER_SITE_OTHER): Payer: Medicare HMO

## 2022-06-12 DIAGNOSIS — Z9889 Other specified postprocedural states: Secondary | ICD-10-CM

## 2022-06-12 MED ORDER — FLUCONAZOLE 150 MG PO TABS: 150.0000 mg | ORAL_TABLET | ORAL | 0 refills | Status: DC

## 2022-06-12 NOTE — Progress Notes (Signed)
Patient seen today at the office for POV #1 DOS 06/07/2022 right foot fusion of 1st MPJ to correct bunion and tailors bunion. Patient denies any fever, nausea, vomiting, chills or chest pain at this time. X-ray was obtained and reviewed by provider.   Dressing was removed. Sutures intact. Patient stated her pain has been minimal and has only taken one oxycodone since surgery.   Provider assessed incision site. Provider advise patient to keep foot clean and dry and to elevate extremity. Patient advised to continue using cam boot, minimize putting weight on foot and to continue using the walker as a knee scooter.   Nurse applied triple antibiotic ointment to incisions, covered incisions with an non-adhesive dressing, followed by gauze wrap, secured with coban and stockinet.   Patient will return to clinic next week for possible suture removal.

## 2022-06-16 ENCOUNTER — Telehealth: Payer: Self-pay

## 2022-06-16 ENCOUNTER — Telehealth: Payer: Self-pay | Admitting: *Deleted

## 2022-06-16 NOTE — Telephone Encounter (Signed)
Pharmacy is calling and wanted to know before releasing new prescription(fluconazole) if current medicines will interfere w/ new one. Please advise. Spoke with patient,updated all of her current medicines on file.

## 2022-06-16 NOTE — Telephone Encounter (Signed)
promethazine (PHENERGAN) 25 MG tablet    Prior authorization was started yesterday on 06/15/22.  PA was denied today on 06/16/22.

## 2022-06-22 ENCOUNTER — Ambulatory Visit (INDEPENDENT_AMBULATORY_CARE_PROVIDER_SITE_OTHER): Payer: Medicare HMO | Admitting: Podiatry

## 2022-06-22 ENCOUNTER — Encounter: Payer: Medicare HMO | Admitting: Podiatry

## 2022-06-22 DIAGNOSIS — Z9889 Other specified postprocedural states: Secondary | ICD-10-CM

## 2022-06-22 NOTE — Progress Notes (Signed)
Patient seen today at the office for POV #2 DOS 06/07/2022 right foot fusion of 1st MPJ to correct bunion and tailors bunion. Patient denies any fever, nausea, vomiting, chills or chest pain at this time.    Dressing was removed. Dissolvable sutures intact. No signs of infection noted to the incisions. Incisions clean and dry.   Patient notified that she is able to shower and gently cleanse area and to apply either Vaseline or triple antibiotic to incisions. Patient is to wear band-aid while using the cam boot unless she is sitting she can have foot to open air.   Patient is to continue elevating extremity. Patient advised to continue using cam boot, minimize putting weight on foot and to continue using the walker as a knee scooter.    Nurse applied triple antibiotic ointment to incisions, covered incisions with a biatain dressing and stockinet.   Advise patient to call the office with any questions or concerns. If any signs or symptoms of infections if noted to go to the ED.

## 2022-07-06 ENCOUNTER — Encounter: Payer: Medicare HMO | Admitting: Podiatry

## 2022-07-07 ENCOUNTER — Ambulatory Visit (INDEPENDENT_AMBULATORY_CARE_PROVIDER_SITE_OTHER): Payer: Medicare HMO | Admitting: Podiatry

## 2022-07-07 ENCOUNTER — Ambulatory Visit (INDEPENDENT_AMBULATORY_CARE_PROVIDER_SITE_OTHER): Payer: Medicare HMO

## 2022-07-07 DIAGNOSIS — Z9889 Other specified postprocedural states: Secondary | ICD-10-CM | POA: Diagnosis not present

## 2022-07-07 DIAGNOSIS — M21611 Bunion of right foot: Secondary | ICD-10-CM

## 2022-07-07 NOTE — Progress Notes (Unsigned)
Subjective: Chief Complaint  Patient presents with   Routine Post Op    Patient came in today for a right foot bunion post op, DOS: 06/07/2022, Patient denies pain, has some tingling, No N/V/F/C/SOB, X-Rays taken today     Tamara Davidson is a 76 y.o. is seen today in office s/p right first MPJ arthrodesis, tailor's bunionectomy preformed on 06/07/2022.  Her pain is controlled.  She has been nonweightbearing in the cam boot.  She been using the knee scooter.  Denies any systemic complaints such as fevers, chills, nausea, vomiting. No calf pain, chest pain, shortness of breath.   Objective: General: No acute distress, AAOx3  DP/PT pulses palpable 2/4, CRT < 3 sec to all digits.  Protective sensation intact. Motor function intact.  Right foot: Incision is well coapted without any evidence of dehiscence with sutures intact. There is no surrounding erythema, ascending cellulitis, fluctuance, crepitus, malodor, drainage/purulence.  There is some mild maceration noted on the first interspace that she has been keeping a lot of as well as in between the toes as well.  There is mild edema around the surgical site. There is no significant pain along the surgical site.  Toes in rectus position No other areas of tenderness to bilateral lower extremities.  No other open lesions or pre-ulcerative lesions.  No pain with calf compression, swelling, warmth, erythema.   Assessment and Plan:  Status post right foot surgery, doing well with no complications   -Treatment options discussed including all alternatives, risks, and complications -X-rays obtained reviewed.  3 views right foot were obtained.  No evidence of acute fracture.  Status post first MPJ arthrodesis with hardware intact any complicating factors. -Washing with soap and water only a small amount of antibiotic ointment on the incision do not apply interdigitally. -She can start to transition to partial weightbearing as tolerated still in the cam boot.   She feels comfortable she can transition to weightbearing as tolerated in the cam boot. -Ice/elevation -Pain medication as needed. -Monitor for any clinical signs or symptoms of infection and DVT/PE and directed to call the office immediately should any occur or go to the ER. -Follow-up as scheduled or sooner if any problems arise. In the meantime, encouraged to call the office with any questions, concerns, change in symptoms.   Ovid Curd, DPM

## 2022-07-20 ENCOUNTER — Telehealth: Payer: Self-pay | Admitting: *Deleted

## 2022-07-20 ENCOUNTER — Ambulatory Visit (INDEPENDENT_AMBULATORY_CARE_PROVIDER_SITE_OTHER): Payer: Medicare HMO

## 2022-07-20 ENCOUNTER — Ambulatory Visit (INDEPENDENT_AMBULATORY_CARE_PROVIDER_SITE_OTHER): Payer: Medicare HMO | Admitting: Podiatry

## 2022-07-20 DIAGNOSIS — Z9889 Other specified postprocedural states: Secondary | ICD-10-CM

## 2022-07-20 DIAGNOSIS — M2011 Hallux valgus (acquired), right foot: Secondary | ICD-10-CM | POA: Diagnosis not present

## 2022-07-20 DIAGNOSIS — B351 Tinea unguium: Secondary | ICD-10-CM

## 2022-07-20 DIAGNOSIS — M21611 Bunion of right foot: Secondary | ICD-10-CM

## 2022-07-20 NOTE — Telephone Encounter (Signed)
Husband is calling to ask if patient can go up and down a flight of stairs w/ boot on. Please advise.

## 2022-07-20 NOTE — Progress Notes (Signed)
Subjective:  Patient ID: Tamara Davidson, female    DOB: 1946-03-26,  MRN: [id number]  Chief Complaint  Patient presents with   Routine Post Op     RT FOOT FUSION OF 1ST MPJ TO CORRECT BUNION, SHAVING OF TAILORS BUNION 2 weeks    DOS: 8/9.2023 Procedure: Right foot 1st MPJ arthrodesis, tailor's bunionectomy   76 y.o. female returns for post-op check.  Patient states she is doing well at this time denies pain in the right foot at the medial or lateral aspect.  She has been partially weightbearing in her cam boot since last visit.  Denies any issue with this.  She is still been using her knee scooter to get around and aid in mobility as well.  Has been washing foot with warm soapy water denies any drainage or issues at the incision sites.  Review of Systems: Negative except as noted in the HPI. Denies N/V/F/Ch.   Objective:  There were no vitals filed for this visit. There is no height or weight on file to calculate BMI. Constitutional Well developed. Well nourished.  Vascular Foot warm and well perfused. Capillary refill normal to all digits.  Calf is soft and supple, no posterior calf or knee pain, negative Homans' sign  Neurologic Normal speech. Oriented to person, place, and time. Epicritic sensation to light touch grossly present bilaterally.  Dermatologic Skin healing well without signs of infection. Skin edges well coapted without signs of infection.  Very small eschar present at 2 points along the incision about the first MPJ otherwise wound is completely healed.  Minimal eschar noted at the lateral incision.  Orthopedic: Minimal to no tenderness to palpation noted about the surgical site at first MPJ and fifth met head.    Multiple view plain film radiographs: 3 views weightbearing AP lateral oblique x-rays taken of the right foot demonstrate intact hardware at the first MPJ with stable and locking plate in place no evidence of hardware complication.  There is noted to be 50 to  75% osseous bridging across at the arthrodesis site with no gapping present. Assessment:   1. Post-operative state   2. Status post right foot surgery   3. Bunion, right   4. Onychomycosis    Plan:  Patient was evaluated and treated and all questions answered.  S/p foot surgery right foot first MPJ arthrodesis and tailor's bunionectomy progressing well 6 weeks postoperatively -Progressing as expected post-operatively. -XR: As above no evidence of osseous or hardware complication -WB Status: Weightbearing as tolerated in cam boot.  I discussed with the patient that she can now begin putting more weight down in the cam boot but I want her to continue wearing this at all times when she is ambulating.  She does not need to wear it when she is nonweightbearing sleeping etc. recommend continued use of the cam boot until her next appointment. -Continue washing the foot with warm soapy water and then dry thoroughly after.  She does not need to put a dressing on the lateral incision at this time as it is well-healed but okay to put antibiotic ointment for eschar control.  There were 2 small areas of eschar that were debrided with 15 blade at the dorsal first MPJ incision with very superficial abrasion noted underneath.  I recommend continued antibiotic ointment applied to the incision and covered with a large adhesive bandage daily for approximately the next week. -Patient is to monitor for any pain swelling incision problems including redness swelling or drainage and call  the office if these occur. -She will follow-up in 4 weeks for another follow-up visit.  Return in about 4 weeks (around 08/17/2022) for Post op visit R foot 1st MPJ fusion w/ dr. Jacqualyn Posey .         Everitt Amber, DPM [facility]

## 2022-07-24 NOTE — Telephone Encounter (Signed)
Spoke with patient giving recommendations per physician,verbalized understanding. 

## 2022-07-24 NOTE — Telephone Encounter (Signed)
Patient has been notified

## 2022-08-15 LAB — CBC WITH DIFFERENTIAL/PLATELET
Absolute Monocytes: 464 cells/uL (ref 200–950)
Basophils Absolute: 17 cells/uL (ref 0–200)
Basophils Relative: 0.3 %
Eosinophils Absolute: 70 cells/uL (ref 15–500)
Eosinophils Relative: 1.2 %
HCT: 39.6 % (ref 35.0–45.0)
Hemoglobin: 13.6 g/dL (ref 11.7–15.5)
Lymphs Abs: 2819 cells/uL (ref 850–3900)
MCH: 31.2 pg (ref 27.0–33.0)
MCHC: 34.3 g/dL (ref 32.0–36.0)
MCV: 90.8 fL (ref 80.0–100.0)
MPV: 10 fL (ref 7.5–12.5)
Monocytes Relative: 8 %
Neutro Abs: 2430 cells/uL (ref 1500–7800)
Neutrophils Relative %: 41.9 %
Platelets: 244 10*3/uL (ref 140–400)
RBC: 4.36 10*6/uL (ref 3.80–5.10)
RDW: 13.3 % (ref 11.0–15.0)
Total Lymphocyte: 48.6 %
WBC: 5.8 10*3/uL (ref 3.8–10.8)

## 2022-08-15 LAB — HEPATIC FUNCTION PANEL
AG Ratio: 1.5 (calc) (ref 1.0–2.5)
ALT: 11 U/L (ref 6–29)
AST: 16 U/L (ref 10–35)
Albumin: 4.3 g/dL (ref 3.6–5.1)
Alkaline phosphatase (APISO): 79 U/L (ref 37–153)
Bilirubin, Direct: 0.1 mg/dL (ref 0.0–0.2)
Globulin: 2.9 g/dL (calc) (ref 1.9–3.7)
Indirect Bilirubin: 0.3 mg/dL (calc) (ref 0.2–1.2)
Total Bilirubin: 0.4 mg/dL (ref 0.2–1.2)
Total Protein: 7.2 g/dL (ref 6.1–8.1)

## 2022-08-17 ENCOUNTER — Ambulatory Visit (INDEPENDENT_AMBULATORY_CARE_PROVIDER_SITE_OTHER): Payer: Medicare HMO

## 2022-08-17 ENCOUNTER — Ambulatory Visit (INDEPENDENT_AMBULATORY_CARE_PROVIDER_SITE_OTHER): Payer: Medicare HMO | Admitting: Podiatry

## 2022-08-17 DIAGNOSIS — Z9889 Other specified postprocedural states: Secondary | ICD-10-CM | POA: Diagnosis not present

## 2022-08-17 DIAGNOSIS — M21611 Bunion of right foot: Secondary | ICD-10-CM

## 2022-08-17 DIAGNOSIS — B351 Tinea unguium: Secondary | ICD-10-CM

## 2022-08-17 NOTE — Progress Notes (Signed)
Subjective: Chief Complaint  Patient presents with   Routine Post Op    Right foot POV#5 bunion repair,  Patient denies any pain, X-Rays done today TX: Cam boot      Tamara Davidson is a 76 y.o. is seen today in office s/p right first MPJ arthrodesis, tailor's bunionectomy preformed on 06/07/2022.  She is not wearing the cam boot.  She has no pain.  No recent injuries.  She also recently got blood work for the nail fungus.  She is still taking fluconazole without any side effects.  Minimal improvement the toenails.  Objective: General: No acute distress, AAOx3  DP/PT pulses palpable 2/4, CRT < 3 sec to all digits.  Protective sensation intact. Motor function intact.  Right foot: Incision is well coapted without any evidence of dehiscence and scar is formed.  The toes in rectus position.  There is no pain to the toe.  Arthrodesis site appears to be stable.  Trace edema.  No erythema or warmth.  No signs of infection.  Toe is rectus. Nails continue to be hypertrophic, dystrophic and there is yellow, brown discoloration but there is also darkened discoloration present the majority of the toenails.  There is no extensively hyperpigmentation of the surrounding toe. No pain with calf compression, swelling, warmth, erythema.   Assessment and Plan:  Status post right foot surgery, doing well with no complications   -Treatment options discussed including all alternatives, risks, and complications -X-rays obtained reviewed.  3 views were obtained.  No evidence of acute fracture.  Arthrodesis site appears to be stable with increase consolidation noted.  Hardware intact. -Discussed gradual transition to regular shoe as tolerated.  Continue ice, elevate as well as compression help with any residual edema. -Blood work reviewed.  Continue current medications for nail fungus.    I will see her back in 4 weeks at that point recheck the surgery with repeat x-rays but also reevaluate the toenails.  Trula Slade DPM

## 2022-08-22 ENCOUNTER — Other Ambulatory Visit: Payer: Self-pay | Admitting: Orthopedic Surgery

## 2022-08-22 NOTE — Telephone Encounter (Signed)
Pharmacy requested refill.  Pended Rx and sent to Amy for approval due to HIGH ALERT Warning.  

## 2022-08-29 ENCOUNTER — Ambulatory Visit (INDEPENDENT_AMBULATORY_CARE_PROVIDER_SITE_OTHER): Payer: Medicare HMO

## 2022-08-29 DIAGNOSIS — Z23 Encounter for immunization: Secondary | ICD-10-CM

## 2022-09-14 ENCOUNTER — Ambulatory Visit (INDEPENDENT_AMBULATORY_CARE_PROVIDER_SITE_OTHER): Payer: Medicare HMO

## 2022-09-14 ENCOUNTER — Ambulatory Visit (INDEPENDENT_AMBULATORY_CARE_PROVIDER_SITE_OTHER): Payer: Medicare HMO | Admitting: Podiatry

## 2022-09-14 DIAGNOSIS — Z79899 Other long term (current) drug therapy: Secondary | ICD-10-CM | POA: Diagnosis not present

## 2022-09-14 DIAGNOSIS — Z9889 Other specified postprocedural states: Secondary | ICD-10-CM | POA: Diagnosis not present

## 2022-09-14 DIAGNOSIS — B351 Tinea unguium: Secondary | ICD-10-CM | POA: Diagnosis not present

## 2022-09-14 DIAGNOSIS — M21621 Bunionette of right foot: Secondary | ICD-10-CM

## 2022-09-14 DIAGNOSIS — M21611 Bunion of right foot: Secondary | ICD-10-CM | POA: Diagnosis not present

## 2022-09-14 NOTE — Progress Notes (Signed)
Subjective: Chief Complaint  Patient presents with   Nail Problem    Patient reports nails are still discolored and brittle.   Routine Post Op    Bunion right     Tamara Davidson is a 76 y.o. is seen today in office s/p right first MPJ arthrodesis, tailor's bunionectomy preformed on 06/07/2022.  She she states that she has been doing well.  She continues to her gait but she is wearing regular shoes and doing more normal activities.  She would like to do the left foot in February.  She is also recently finished the itraconazole for the nails.  She is the nails are still discolored and brittle.  No pain in the nails.  No swelling redness or drainage.  No open lesions.  No breast concerns.  Objective: General: No acute distress, AAOx3  DP/PT pulses palpable 2/4, CRT < 3 sec to all digits.  Protective sensation intact. Motor function intact.  Right foot: Incision is well coapted without any evidence of dehiscence and scar is formed.  Toes in rectus position.  Weightbearing evaluation the toe sits slightly dorsiflexed which is purposeful.  There is trace edema there is no erythema or warmth. Left foot: Severe bunions present with mobility the first MPJ.  Tenderness on the bunion itself. Bilateral: Nails continue be hypertrophic, dystrophic with brown, dark discoloration.  No extension of any hypermedication in the surrounding skin and appears to be localized along the medial self.  There is no edema, erythema or signs of infection. No pain with calf compression, swelling, warmth, erythema.   Assessment and Plan:  Status post right foot surgery, doing well with no complications ; left foot severe bunion; onychosis  -Treatment options discussed including all alternatives, risks, and complications -X-rays obtained reviewed.  3 views were obtained.  No evidence of acute fracture.  Arthrodesis site appears to be stable with increase consolidation noted.  Hardware intact.  No evidence of, new  patches. -In regards to the right foot bunion surgery continue with shoes, good arch support and gradual increase activity level as tolerated.  Continue ice, elevate as well as compression to help with any residual edema. -Will plan for left first MPJ arthrodesis second week in February. -She is on itraconazole. We will check blood work.  May consider switching medications as there is not been significant improvement in the toenails ([facility])  Return in about 2 months (around 11/14/2022).  Surgery consult left foot  [name] DPM

## 2022-09-16 LAB — HEPATIC FUNCTION PANEL
AG Ratio: 1.3 (calc) (ref 1.0–2.5)
ALT: 13 U/L (ref 6–29)
AST: 16 U/L (ref 10–35)
Albumin: 4.2 g/dL (ref 3.6–5.1)
Alkaline phosphatase (APISO): 86 U/L (ref 37–153)
Bilirubin, Direct: 0.1 mg/dL (ref 0.0–0.2)
Globulin: 3.2 g/dL (calc) (ref 1.9–3.7)
Indirect Bilirubin: 0.4 mg/dL (calc) (ref 0.2–1.2)
Total Bilirubin: 0.5 mg/dL (ref 0.2–1.2)
Total Protein: 7.4 g/dL (ref 6.1–8.1)

## 2022-09-16 LAB — CBC WITH DIFFERENTIAL/PLATELET
Absolute Monocytes: 432 cells/uL (ref 200–950)
Basophils Absolute: 18 cells/uL (ref 0–200)
Basophils Relative: 0.3 %
Eosinophils Absolute: 72 cells/uL (ref 15–500)
Eosinophils Relative: 1.2 %
HCT: 39.3 % (ref 35.0–45.0)
Hemoglobin: 13.4 g/dL (ref 11.7–15.5)
Lymphs Abs: 3048 cells/uL (ref 850–3900)
MCH: 31.1 pg (ref 27.0–33.0)
MCHC: 34.1 g/dL (ref 32.0–36.0)
MCV: 91.2 fL (ref 80.0–100.0)
MPV: 10.3 fL (ref 7.5–12.5)
Monocytes Relative: 7.2 %
Neutro Abs: 2430 cells/uL (ref 1500–7800)
Neutrophils Relative %: 40.5 %
Platelets: 241 10*3/uL (ref 140–400)
RBC: 4.31 10*6/uL (ref 3.80–5.10)
RDW: 12.9 % (ref 11.0–15.0)
Total Lymphocyte: 50.8 %
WBC: 6 10*3/uL (ref 3.8–10.8)

## 2022-09-20 ENCOUNTER — Other Ambulatory Visit: Payer: Self-pay | Admitting: Podiatry

## 2022-09-20 MED ORDER — FLUCONAZOLE 150 MG PO TABS: 150.0000 mg | ORAL_TABLET | ORAL | 0 refills | Status: DC

## 2022-10-04 ENCOUNTER — Telehealth: Payer: Self-pay | Admitting: *Deleted

## 2022-10-04 NOTE — Telephone Encounter (Signed)
Patient is returning Leah's call, did not say what this was about, nothing in epic about the call. Please call.

## 2022-11-13 ENCOUNTER — Other Ambulatory Visit: Payer: Medicare HMO

## 2022-11-13 ENCOUNTER — Other Ambulatory Visit: Payer: Self-pay | Admitting: Orthopedic Surgery

## 2022-11-13 DIAGNOSIS — R7303 Prediabetes: Secondary | ICD-10-CM

## 2022-11-13 DIAGNOSIS — E7800 Pure hypercholesterolemia, unspecified: Secondary | ICD-10-CM

## 2022-11-14 ENCOUNTER — Ambulatory Visit: Payer: Medicare HMO | Admitting: Podiatry

## 2022-11-14 ENCOUNTER — Ambulatory Visit (INDEPENDENT_AMBULATORY_CARE_PROVIDER_SITE_OTHER): Payer: Medicare HMO

## 2022-11-14 DIAGNOSIS — Z01818 Encounter for other preprocedural examination: Secondary | ICD-10-CM

## 2022-11-14 DIAGNOSIS — Z79899 Other long term (current) drug therapy: Secondary | ICD-10-CM

## 2022-11-14 DIAGNOSIS — M21612 Bunion of left foot: Secondary | ICD-10-CM

## 2022-11-14 LAB — HEMOGLOBIN A1C
Hgb A1c MFr Bld: 5.9 % of total Hgb — ABNORMAL HIGH (ref <5.7)
Mean Plasma Glucose: 123 mg/dL
eAG (mmol/L): 6.8 mmol/L

## 2022-11-14 LAB — LIPID PANEL
Cholesterol: 182 mg/dL (ref <200)
HDL: 65 mg/dL (ref >=50)
LDL Cholesterol (Calc): 101 mg/dL (calc) — ABNORMAL HIGH
Non-HDL Cholesterol (Calc): 117 mg/dL (calc) (ref <130)
Total CHOL/HDL Ratio: 2.8 (calc) (ref <5.0)
Triglycerides: 70 mg/dL (ref <150)

## 2022-11-14 LAB — HM MAMMOGRAPHY

## 2022-11-14 NOTE — Patient Instructions (Signed)
Pre-Operative Instructions  Congratulations, you have decided to take an important step to improving your quality of life.  You can be assured that the doctors of [facility] will be with you every step of the way.  Plan to be at the surgery center/hospital at least 1 (one) hour prior to your scheduled time unless otherwise directed by the surgical center/hospital staff.  You must have a responsible adult accompany you, remain during the surgery and drive you home.  Make sure you have directions to the surgical center/hospital and know how to get there on time. For hospital based surgery you will need to obtain a history and physical form from your family physician within 1 month prior to the date of surgery- we will give you a form for you primary physician.  We make every effort to accommodate the date you request for surgery.  There are however, times where surgery dates or times have to be moved.  We will contact you as soon as possible if a change in schedule is required.   No Aspirin/Ibuprofen for one week before surgery.  If you are on aspirin, any non-steroidal anti-inflammatory medications (Mobic, Aleve, Ibuprofen) you should stop taking it 7 days prior to your surgery.  You make take Tylenol  For pain prior to surgery.  Medications- If you are taking daily heart and blood pressure medications, seizure, reflux, allergy, asthma, anxiety, pain or diabetes medications, make sure the surgery center/hospital is aware before the day of surgery so they may notify you which medications to take or avoid the day of surgery. No food or drink after midnight the night before surgery unless directed otherwise by surgical center/hospital staff. No alcoholic beverages 24 hours prior to surgery.  No smoking 24 hours prior to or 24 hours after surgery. Wear loose pants or shorts- loose enough to fit over bandages, boots, and casts. No slip on shoes, sneakers are best. Bring your boot with you to the  surgery center/hospital.  Also bring crutches or a walker if your physician has prescribed it for you.  If you do not have this equipment, it will be provided for you after surgery. If you have not been contracted by the surgery center/hospital by the day before your surgery, call to confirm the date and time of your surgery. Leave-time from work may vary depending on the type of surgery you have.  Appropriate arrangements should be made prior to surgery with your employer. Prescriptions will be provided immediately following surgery by your doctor.  Have these filled as soon as possible after surgery and take the medication as directed. Remove nail polish on the operative foot. Wash the night before surgery.  The night before surgery wash the foot and leg well with the antibacterial soap provided and water paying special attention to beneath the toenails and in between the toes.  Rinse thoroughly with water and dry well with a towel.  Perform this wash unless told not to do so by your physician.  Enclosed: 1 Ice pack (please put in freezer the night before surgery)   1 Hibiclens skin cleaner   Pre-op Instructions  If you have any questions regarding the instructions, do not hesitate to call our office at any point during this process.   [place]: [address] [place], [place] [address] [phone]  [place]: [address] [place], [place] [address] [phone]  Dr. Abagale Boulos, DPM  

## 2022-11-14 NOTE — Progress Notes (Signed)
Subjective: Chief Complaint  Patient presents with   Foot Problem    Follow up on bilateral toes, fungus, left foot surgical consult    77 year old female presents the office with above concerns.  She states that the fungus seems to be doing better the nails are getting lighter but still remains.  She also wants to schedule surgery for left foot bunion.  She said her right foot is been doing great and she wants to proceed with surgery left foot is continue to cause discomfort.  Of note on the left foot the tailor's bunion is not causing any pain.  Objective: AAO x3, NAD DP/PT pulses palpable bilaterally, CRT less than 3 seconds Nails continue be hypertrophic, dystrophic with some clearing is present along the proximal nail fold.  No edema, erythema or signs of infection. Significant bunion present on the left foot with tenderness directly on the first MPJ.  No crepitation with range of motion of the first MPJ.  No other areas of pinpoint tenderness.  On the left foot there is no pathology there is bunion.   No pain on the right foot in the area of the surgical sites.   No pain with calf compression, swelling, warmth, erythema  Assessment: Onychomycosis, left foot bunion  Plan: -All treatment options discussed with the patient including all alternatives, risks, complications.  -With a nail fungus we will recheck blood work.  Continue medication continue to monitor for any side effects. -Reviewed the x-rays with her in the left foot.  Severe bunions present.  She has attempted shoe modification, offloading padding without significant resolution and she wants to proceed with surgical intervention.  Discussed with her first MPJ arthrodesis on the left foot she wants to proceed with this that she did well on the right side. -The incision placement as well as the postoperative course was discussed with the patient. I discussed risks of the surgery which include, but not limited to, infection,  bleeding, pain, swelling, need for further surgery, delayed or nonhealing, painful or ugly scar, numbness or sensation changes, over/under correction, recurrence, transfer lesions, further deformity, hardware failure, DVT/PE, loss of toe/foot. Patient understands these risks and wishes to proceed with surgery. The surgical consent was reviewed with the patient all 3 pages were signed. No promises or guarantees were given to the outcome of the procedure. All questions were answered to the best of my ability. Before the surgery the patient was encouraged to call the office if there is any further questions. The surgery will be performed at the [facility] on an outpatient basis. -Patient encouraged to call the office with any questions, concerns, change in symptoms.   Trula Slade DPM

## 2022-11-15 ENCOUNTER — Telehealth: Payer: Self-pay | Admitting: Podiatry

## 2022-11-15 NOTE — Telephone Encounter (Addendum)
DOS: 12/13/2022  [employer] Medicare Effective 10/30/2022  Hallux MPJ Fusion Lt (02542)  Deductible: $250 with $0 met Out-of-Pocket: $3,000 with $0 met CoInsurance: 0%  Prior authorization is not required per [employer], [employer]/film/video.  Call Reference #: [id number]

## 2022-11-16 ENCOUNTER — Ambulatory Visit: Payer: Medicare HMO | Admitting: Orthopedic Surgery

## 2022-11-16 ENCOUNTER — Encounter: Payer: Self-pay | Admitting: Orthopedic Surgery

## 2022-11-16 VITALS — BP 120/78 | HR 80 | Temp 97.8°F | Resp 18 | Ht 61.0 in | Wt 112.1 lb

## 2022-11-16 DIAGNOSIS — M21612 Bunion of left foot: Secondary | ICD-10-CM | POA: Diagnosis not present

## 2022-11-16 DIAGNOSIS — I10 Essential (primary) hypertension: Secondary | ICD-10-CM

## 2022-11-16 DIAGNOSIS — R7303 Prediabetes: Secondary | ICD-10-CM

## 2022-11-16 DIAGNOSIS — E7800 Pure hypercholesterolemia, unspecified: Secondary | ICD-10-CM | POA: Diagnosis not present

## 2022-11-16 NOTE — Patient Instructions (Signed)
Eye specialists: Dr. Katy Fitch, Dr. Ellie Lunch, Dr. Manuella Ghazi

## 2022-11-16 NOTE — Progress Notes (Signed)
Careteam: Patient Care Team: [name], NP as PCP - General (Adult Health Nurse Practitioner) Beverely Low, MD as Consulting Physician (Orthopedic Surgery) Mateo Flow, MD as Consulting Physician (Ophthalmology)  Seen by: Hazle Nordmann, AGNP-C  PLACE OF SERVICE:  [facility]  Advanced Directive information    Allergies  Allergen Reactions   Chocolate Diarrhea   Chocolate Flavor    Iodine    Penicillins    Povidone Iodine    Shellfish Allergy    Statins Other (See Comments)    High LFT's   Strawberry (Diagnostic) Itching   Strawberry Extract     Chief Complaint  Patient presents with   Medical Management of Chronic Issues    Six month Follow Up. [facility] verified     HPI: Patient is a 77 y.o. female seen today for medical management of chronic conditions.   Lab work discussed with patient.   No health concerns today.    Left bunion removal (MPJ arthrodesis) scheduled with Dr. Ardelle Anton 12/13/2022.   LDL 101> was 97 (04/2022). Remains on Crestor.   A1c 5.9> was 5.6. Admits to increased holiday eating.   Mammogram completed 01/16> negative for malignancy. DEXA 12/02/2021> normal, f/u in 5 years. Gynecology will order future studies. She continues to see them yearly for women's health.   Weight down 6 lbs in 6 months. Admits to lack of appetite after last bunion surgery.     Review of Systems:  Review of Systems  Constitutional:  Negative for chills and fever.  HENT:  Negative for hearing loss and sore throat.   Eyes:  Negative for blurred vision and double vision.  Respiratory:  Negative for cough, shortness of breath and wheezing.   Cardiovascular:  Negative for chest pain and leg swelling.  Gastrointestinal:  Negative for constipation and heartburn.  Genitourinary:  Negative for dysuria.  Musculoskeletal:  Positive for joint pain. Negative for falls.  Skin:  Negative for rash.  Neurological:  Negative for dizziness, weakness and headaches.   Psychiatric/Behavioral:  Negative for depression. The patient is not nervous/anxious.     Past Medical History:  Diagnosis Date   Disorder of bone and cartilage, unspecified    Disorders of bursae and tendons in shoulder region, unspecified    Diverticulitis of colon (without mention of hemorrhage)(562.11)    Essential hypertension, benign    Ganglion of tendon sheath    Lumbago    Other abnormal blood chemistry    Other and unspecified hyperlipidemia    Premenopausal menorrhagia    Sebaceous cyst    Shortness of breath    Past Surgical History:  Procedure Laterality Date   ROTATOR CUFF REPAIR Left 10/06/13   tendon and ligament repair   Social History:   reports that she has quit smoking. Her smoking use included cigarettes. She started smoking about 53 years ago. She has a 5.00 pack-year smoking history. She has never used smokeless tobacco. She reports that she does not drink alcohol and does not use drugs.  Family History  Problem Relation Age of Onset   Lung cancer Mother    Osteoporosis Mother    Coronary artery disease Father     Medications: Patient's Medications  New Prescriptions   No medications on file  Previous Medications   ASPIRIN 81 MG TABLET    Take 81 mg by mouth daily.    AZELASTINE HCL (ASTEPRO) 0.15 % SOLN    Place 2 sprays into both nostrils every morning.   B COMPLEX  VITAMINS (B COMPLEX PO)    Take 1 tablet by mouth daily.   CALCIUM CARBONATE (OSCAL) 1500 (600 CA) MG TABS TABLET    Take 1 tablet by mouth 2 (two) times daily with a meal.   CHOLECALCIFEROL (VITAMIN D) 400 UNITS TABS TABLET    Take 400 Units by mouth daily.    FLUCONAZOLE (DIFLUCAN) 150 MG TABLET    Take 1 tablet (150 mg total) by mouth once a week.   GLUCOSAMINE-CHONDROIT-VIT C-MN (GLUCOSAMINE CHONDR 1500 COMPLX PO)    Take by mouth daily.   GUAIFENESIN (MUCINEX) 600 MG 12 HR TABLET    Take 600 mg by mouth daily.    LEVOCETIRIZINE (XYZAL) 5 MG TABLET    Take 5 mg by mouth every  evening.   LISINOPRIL-HYDROCHLOROTHIAZIDE (ZESTORETIC) 10-12.5 MG TABLET    Take 1 tablet by mouth daily.   MULTIPLE VITAMINS-MINERALS (MULTIVITAMIN WITH MINERALS) TABLET    Take 1 tablet by mouth daily.   PROBIOTIC PRODUCT (PROBIOTIC DAILY PO)    Take 1 tablet by mouth daily.    PROMETHAZINE (PHENERGAN) 25 MG TABLET    Take 1 tablet (25 mg total) by mouth every 8 (eight) hours as needed for nausea or vomiting.   ROSUVASTATIN (CRESTOR) 5 MG TABLET    TAKE ONE-HALF (1/2) TABLET AT BEDTIME ON MONDAY, WEDNESDAY, AND FRIDAY (AVOID GRAPEFRUIT PRODUCTS)   VITAMIN C (ASCORBIC ACID) 500 MG TABLET    Take 500 mg by mouth daily.  Modified Medications   No medications on file  Discontinued Medications   No medications on file    Physical Exam:  There were no vitals filed for this visit. There is no height or weight on file to calculate BMI. Wt Readings from Last 3 Encounters:  05/11/22 118 lb (53.5 kg)  10/27/21 114 lb 12.8 oz (52.1 kg)  04/21/21 117 lb 6.4 oz (53.3 kg)    Physical Exam Vitals reviewed.  Constitutional:      General: She is not in acute distress. HENT:     Head: Normocephalic.  Eyes:     General:        Right eye: No discharge.        Left eye: No discharge.  Cardiovascular:     Rate and Rhythm: Normal rate and regular rhythm.     Pulses: Normal pulses.     Heart sounds: Normal heart sounds.  Pulmonary:     Effort: Pulmonary effort is normal. No respiratory distress.     Breath sounds: Normal breath sounds. No wheezing.  Abdominal:     General: Bowel sounds are normal. There is no distension.     Palpations: Abdomen is soft.     Tenderness: There is no abdominal tenderness.  Musculoskeletal:     Cervical back: Neck supple.     Right lower leg: No edema.     Left lower leg: No edema.  Skin:    General: Skin is warm and dry.     Capillary Refill: Capillary refill takes less than 2 seconds.  Neurological:     General: No focal deficit present.     Mental  Status: She is alert and oriented to person, place, and time.  Psychiatric:        Mood and Affect: Mood normal.        Behavior: Behavior normal.     Labs reviewed: Basic Metabolic Panel: Recent Labs    05/08/22 0843  NA 140  K 4.1  CL 103  CO2 27  GLUCOSE 100*  BUN 15  CREATININE 0.88  CALCIUM 9.5   Liver Function Tests: Recent Labs    05/08/22 0843 08/14/22 1006 09/15/22 1134  AST 15 16 16   ALT 12 11 13   BILITOT 0.4 0.4 0.5  PROT 7.3 7.2 7.4   No results for input(s): "LIPASE", "AMYLASE" in the last 8760 hours. No results for input(s): "AMMONIA" in the last 8760 hours. CBC: Recent Labs    05/08/22 0843 08/14/22 1006 09/15/22 1134  WBC 6.5 5.8 6.0  NEUTROABS 2,743 2,430 2,430  HGB 13.9 13.6 13.4  HCT 41.5 39.6 39.3  MCV 91.0 90.8 91.2  PLT 240 244 241   Lipid Panel: Recent Labs    05/08/22 0843 11/13/22 0930  CHOL 179 182  HDL 60 65  LDLCALC 97 101*  TRIG 120 70  CHOLHDL 3.0 2.8   TSH: No results for input(s): "TSH" in the last 8760 hours. A1C: Lab Results  Component Value Date   HGBA1C 5.9 (H) 11/13/2022     Assessment/Plan 1. Pure hypercholesterolemia - LDL 101 11/13/2022 - cont Crestor  2. Essential hypertension, benign - controlled - cont lisinopril-HCTZ  3. Prediabetes - A1c 5.9> was 5.9 - admits to holiday eating - limit diet in carbs and sugars - eye exam 03/2022  4. Bunion, left foot - followed by Dr. Jacqualyn Posey - 02/14 MPJ arthrodesis scheduled  Total time: 31 minutes. Greater than 50% of total time spent doing patient education regarding health maintenance, prediabetes, HTN, HLD including symptom and medication management,     Next appt: Visit date not found  [place], [facility] [phone]

## 2022-12-12 ENCOUNTER — Other Ambulatory Visit: Payer: Self-pay | Admitting: Orthopedic Surgery

## 2022-12-12 LAB — CBC WITH DIFFERENTIAL/PLATELET
Basophils Absolute: 0 10*3/uL (ref 0.0–0.2)
Basos: 0 %
EOS (ABSOLUTE): 0.1 10*3/uL (ref 0.0–0.4)
Eos: 1 %
Hematocrit: 39.2 % (ref 34.0–46.6)
Hemoglobin: 13.3 g/dL (ref 11.1–15.9)
Immature Grans (Abs): 0 10*3/uL (ref 0.0–0.1)
Immature Granulocytes: 0 %
Lymphocytes Absolute: 3.1 10*3/uL (ref 0.7–3.1)
Lymphs: 42 %
MCH: 30.4 pg (ref 26.6–33.0)
MCHC: 33.9 g/dL (ref 31.5–35.7)
MCV: 90 fL (ref 79–97)
Monocytes Absolute: 0.5 10*3/uL (ref 0.1–0.9)
Monocytes: 7 %
Neutrophils Absolute: 3.6 10*3/uL (ref 1.4–7.0)
Neutrophils: 50 %
Platelets: 233 10*3/uL (ref 150–450)
RBC: 4.38 x10E6/uL (ref 3.77–5.28)
RDW: 12.5 % (ref 11.7–15.4)
WBC: 7.4 10*3/uL (ref 3.4–10.8)

## 2022-12-12 LAB — HEPATIC FUNCTION PANEL
ALT: 15 IU/L (ref 0–32)
AST: 20 IU/L (ref 0–40)
Albumin: 4.5 g/dL (ref 3.8–4.8)
Alkaline Phosphatase: 103 IU/L (ref 44–121)
Bilirubin Total: 0.4 mg/dL (ref 0.0–1.2)
Bilirubin, Direct: 0.12 mg/dL (ref 0.00–0.40)
Total Protein: 7.3 g/dL (ref 6.0–8.5)

## 2022-12-13 ENCOUNTER — Other Ambulatory Visit: Payer: Self-pay | Admitting: Podiatry

## 2022-12-13 ENCOUNTER — Encounter: Payer: Self-pay | Admitting: Podiatry

## 2022-12-13 DIAGNOSIS — M2012 Hallux valgus (acquired), left foot: Secondary | ICD-10-CM

## 2022-12-13 HISTORY — PX: DG FOOT LEFT COMPLETE (ARMC HX): HXRAD1519

## 2022-12-13 MED ORDER — OXYCODONE-ACETAMINOPHEN 5-325 MG PO TABS: 1.0000 | ORAL_TABLET | Freq: Four times a day (QID) | ORAL | 0 refills | Status: DC | PRN

## 2022-12-13 MED ORDER — CLINDAMYCIN HCL 300 MG PO CAPS: 300.0000 mg | ORAL_CAPSULE | Freq: Three times a day (TID) | ORAL | 0 refills | Status: DC

## 2022-12-13 MED ORDER — PROMETHAZINE HCL 25 MG PO TABS: 25.0000 mg | ORAL_TABLET | Freq: Three times a day (TID) | ORAL | 0 refills | Status: DC | PRN

## 2022-12-18 ENCOUNTER — Ambulatory Visit (INDEPENDENT_AMBULATORY_CARE_PROVIDER_SITE_OTHER): Payer: Medicare HMO

## 2022-12-18 ENCOUNTER — Ambulatory Visit (INDEPENDENT_AMBULATORY_CARE_PROVIDER_SITE_OTHER): Payer: Medicare HMO | Admitting: Podiatry

## 2022-12-18 DIAGNOSIS — M21612 Bunion of left foot: Secondary | ICD-10-CM | POA: Diagnosis not present

## 2022-12-18 MED ORDER — FLUCONAZOLE 150 MG PO TABS: 150.0000 mg | ORAL_TABLET | ORAL | 0 refills | Status: DC

## 2022-12-20 NOTE — Progress Notes (Signed)
Subjective: Chief Complaint  Patient presents with   Routine Post Op    POV #1 DOS 12/13/2022 LT FOOT SURGICAL CORRECTION OF BUNION (1ST MPJ FUSION)/DR Jacqualyn Posey PT    Tamara Davidson is a 77 y.o. is seen today in office s/p left foot first MPJ arthrodesis preformed on 12/13/2022.  She states that she is doing well not any pain.  She did have 1 fall but she was wearing the boot.  No other injuries.  No fevers or chills.  No other concerns.     Objective: General: No acute distress, AAOx3  DP/PT pulses palpable 2/4, CRT < 3 sec to all digits.  Protective sensation intact. Motor function intact.  Left foot: Incision is well coapted without any evidence of dehiscence and sutures are intact. There is no surrounding erythema, ascending cellulitis, fluctuance, crepitus, malodor, drainage/purulence. There is mild edema around the surgical site. There is no significant pain along the surgical site.  Arthrodesis appears to be stable.  The hallux is rectus.  Unfortunate lesser digit still sits somewhat laterally deviated which was present prior to surgery. No other areas of tenderness to bilateral lower extremities.  No other open lesions or pre-ulcerative lesions.  No pain with calf compression, swelling, warmth, erythema.   Assessment and Plan:  Status post left foot first MPJ arthrodesis, doing well with no complications   -Treatment options discussed including all alternatives, risks, and complications -X-rays were obtained reviewed.  3 views of the foot were obtained.  No evidence of acute fracture.  Hardware intact status post first MPJ arthrodesis without any complicating factors. -Antibiotic ointment and bandage applied.  Keep dressing clean, dry, intact -Remain in cam boot, nonweightbearing -Ice/elevation -Pain medication as needed. -Refilled fluconazole -Monitor for any clinical signs or symptoms of infection and DVT/PE and directed to call the office immediately should any occur or go to the  ER. -Follow-up as scheduled for POSSIBLE suture remoal or sooner if any problems arise. In the meantime, encouraged to call the office with any questions, concerns, change in symptoms.   Tamara Davidson, DPM

## 2022-12-28 ENCOUNTER — Ambulatory Visit (INDEPENDENT_AMBULATORY_CARE_PROVIDER_SITE_OTHER): Payer: Medicare HMO | Admitting: Podiatry

## 2022-12-28 DIAGNOSIS — M21612 Bunion of left foot: Secondary | ICD-10-CM

## 2022-12-30 NOTE — Progress Notes (Signed)
Subjective: Chief Complaint  Patient presents with   Routine Post Op    POV #2 DOS 12/13/2022 LT FOOT SURGICAL CORRECTION OF BUNION (1ST MPJ FUSION). Patient is doing well. No pain at this time. Wearing cam boot and using scooter. Stitches are intact.     Tamara Davidson is a 77 y.o. is seen today in office s/p left foot first MPJ arthrodesis preformed on 12/13/2022.  She presents today for possible suture removal.  She said that she is doing well and her pain is improved.  She denies any fevers or chills.  She has been nonweightbearing.  Objective: General: No acute distress, AAOx3  DP/PT pulses palpable 2/4, CRT < 3 sec to all digits.  Protective sensation intact. Motor function intact.  Left foot: Incision is well coapted without any evidence of dehiscence and sutures are intact.  There is minimal edema around the incision.  No surrounding erythema, ascending cellulitis.  There is no drainage or pus.  No fluctuance or crepitus or malodor.  The hallux is rectus.   Lateral deviation of the lesser digits which was present prior to surgery. No other areas of tenderness to bilateral lower extremities.  No other open lesions or pre-ulcerative lesions.  No pain with calf compression, swelling, warmth, erythema.   Assessment and Plan:  Status post left foot first MPJ arthrodesis, doing well with no complications   -Treatment options discussed including all alternatives, risks, and complications -Reviewed the sutures today but they are not quite ready to come out completely.  Will plan on removing the rest next appointment.  Antibiotic ointment and dressing applied.  She can keep the dressing, dry, intact until next appointment. -Remain in cam boot, nonweightbearing -Ice/elevation -Pain medication as needed. -Monitor for any clinical signs or symptoms of infection and DVT/PE and directed to call the office immediately should any occur or go to the ER. -Follow-up as scheduled for POSSIBLE suture  remoal or sooner if any problems arise. In the meantime, encouraged to call the office with any questions, concerns, change in symptoms.   Celesta Gentile, DPM

## 2023-01-04 ENCOUNTER — Ambulatory Visit (INDEPENDENT_AMBULATORY_CARE_PROVIDER_SITE_OTHER): Payer: Medicare HMO | Admitting: Podiatry

## 2023-01-04 DIAGNOSIS — M21612 Bunion of left foot: Secondary | ICD-10-CM

## 2023-01-05 NOTE — Progress Notes (Unsigned)
POV #3 DOS 12/13/2022 LT FOOT SURGICAL CORRECTION OF BUNION (1ST MPJ FUSION)   Dr, Jacqualyn Posey examined pt and removed sutures. Pt rewrapped with 3 steri strips and non stick telfa.  --- Subjective:  Chief Complaint  Patient presents with   Routine Post Op    RM 1 POV #3 DOS 12/13/2022 LT FOOT SURGICAL CORRECTION OF BUNION (1ST MPJ FUSION)   Tamara Davidson is a 77 y.o. is seen today in office s/p left foot first MPJ arthrodesis preformed on 12/13/2022.  Presents today for possible suture removal.  States that she is doing well and not in any pain.  Denies any fevers or chills.  No chest pain or shortness of breath.  No other concerns.  Objective: General: No acute distress, AAOx3  DP/PT pulses palpable 2/4, CRT < 3 sec to all digits.  Protective sensation intact. Motor function intact.  Left foot: Incision is well coapted without any evidence of dehiscence and sutures are intact.  There is trace edema.  No erythema or warmth.  There is no areas of drainage or pus or any fluctuation or crepitus or any signs of infection.  After removal of sutures the incision is well coapted.  Toe is rectus neurologically status stable. No other open lesions or pre-ulcerative lesions.  No pain with calf compression, swelling, warmth, erythema.   Assessment and Plan:  Status post left foot first MPJ arthrodesis, doing well with no complications   -Treatment options discussed including all alternatives, risks, and complications -X-rays were obtained reviewed.  3 views of the foot were obtained.  No evidence of acute fracture.  Hardware intact without any complications. -Sutures removed today without complications.  Steri-Strips applied for reinforcement followed by antibiotic ointment and dressing. -Continuation of partial pain started.  Continue ice, elevate as well as compression. -Pain medication as needed. -Monitor for any clinical signs or symptoms of infection and DVT/PE and directed to call the office  immediately should any occur or go to the ER.  Celesta Gentile, DPM

## 2023-01-11 ENCOUNTER — Ambulatory Visit (INDEPENDENT_AMBULATORY_CARE_PROVIDER_SITE_OTHER): Payer: Medicare HMO | Admitting: Podiatry

## 2023-01-11 DIAGNOSIS — M21612 Bunion of left foot: Secondary | ICD-10-CM | POA: Diagnosis not present

## 2023-01-11 NOTE — Progress Notes (Signed)
Subjective: Chief Complaint  Patient presents with   Post-op Follow-up    POV #4 DOS 12/13/2022 LT FOOT SURGICAL CORRECTION OF BUNION (1ST MPJ FUSION) PATIENT MENTIONED SLIGHT SWELLING IN THE FOOT, STATED SHE RUBBED THE FOOT THE WRONG WAY AND CAUSED SOME BLEEDING     Tamara Davidson is a 77 y.o. is seen today in office s/p left foot first MPJ arthrodesis preformed on 12/13/2022.  She is a little increasing swelling but no redness or warmth that she reports.  She does not report any fevers or chills.  She states that her boot is broken and no longer pumping out.   Objective: General: No acute distress, AAOx3  DP/PT pulses palpable 2/4, CRT < 3 sec to all digits.  Protective sensation intact. Motor function intact.  Left foot: Incision is a superficial granular type lesion noted which appears to be 4 scabs come off on the incision.  There is no surrounding erythema, ascending cellulitis.  There is no fluctuation or crepitation.  There is no malodor.  Minimal edema.  No significant pain on exam.  Toe is rectus and arthrodesis site is stable. No other open lesions or pre-ulcerative lesions.  No pain with calf compression, swelling, warmth, erythema.   Assessment and Plan:  Status post left foot first MPJ arthrodesis, doing well with no complications   -Treatment options discussed including all alternatives, risks, and complications -Patient appears to be healing well scabs, pressure superficial granular wound noted.  Recommended antibiotic ointment dressing changes daily.  Monitor closely for any signs or symptoms of infection.  Due to the scab coming off on her remain nonweightbearing use the cam boot for immobilization until this heals and as it heals and she can progress to partial weightbearing.  I did dispense a new cam boot as hers was broken and no longer functioning appropriately to provide better immobilization that she needs. -Monitor for any clinical signs or symptoms of infection and  directed to call the office immediately should any occur or go to the ER.  Return in about 10 days (around 01/21/2023) for Post-op visit, x-ray.  Trula Slade DPM

## 2023-01-30 ENCOUNTER — Ambulatory Visit (INDEPENDENT_AMBULATORY_CARE_PROVIDER_SITE_OTHER): Payer: Medicare HMO | Admitting: Podiatry

## 2023-01-30 ENCOUNTER — Ambulatory Visit (INDEPENDENT_AMBULATORY_CARE_PROVIDER_SITE_OTHER): Payer: Medicare HMO

## 2023-01-30 DIAGNOSIS — Z9889 Other specified postprocedural states: Secondary | ICD-10-CM

## 2023-01-30 DIAGNOSIS — M21612 Bunion of left foot: Secondary | ICD-10-CM

## 2023-01-31 NOTE — Progress Notes (Signed)
Subjective: Chief Complaint  Patient presents with   Post-op Follow-up    POV #5 DOS 12/13/2022 LT FOOT SURGICAL CORRECTION OF BUNION (1ST MPJ FUSION), PATIENT STATED THAT HER FOOT IS DOING A LOT BETTER THAN BEFORE      Tamara Davidson is a 77 y.o. is seen today in office s/p left foot first MPJ arthrodesis preformed on 12/13/2022.  She states that she is doing much better.  She has not made any pain.  She still incisions also looking a lot better.  Denies any drainage or pus.  No swelling or redness.  No fever or chills.  No other concerns.   Objective: General: No acute distress, AAOx3  DP/PT pulses palpable 2/4, CRT < 3 sec to all digits.  Protective sensation intact. Motor function intact.  Left foot: Scabbing is present along the incision.  There is no drainage or pus.  No surrounding erythema, ascending cellulitis.  No fluctuation or crepitation.  No malodor.  Calf is rectus.  Arthrodesis site is stable.  No tenderness palpation of the surgical site. No other open lesions or pre-ulcerative lesions.  No pain with calf compression, swelling, warmth, erythema.   Assessment and Plan:  Status post left foot first MPJ arthrodesis, doing well with no complications   -Treatment options discussed including all alternatives, risks, and complications -X-rays were obtained reviewed.  3 views of the foot were obtained.  Increased consolidation of hardware intact with any complicating factors. -We discussed at this time she is to transition to partial weightbearing for the next week then full weightbearing the following week.  We discussed physical therapy but she remembers the exercises from previous that she is going to the home but if she changes her mind we can refer her to physical therapy.  Continue ice, elevate as well as compression of the residual edema.  Return in about 3 weeks (around 02/20/2023) for post-op visit, x-ray.  Trula Slade DPM

## 2023-02-22 ENCOUNTER — Ambulatory Visit (INDEPENDENT_AMBULATORY_CARE_PROVIDER_SITE_OTHER): Payer: Medicare HMO | Admitting: Podiatry

## 2023-02-22 ENCOUNTER — Encounter: Payer: Self-pay | Admitting: Podiatry

## 2023-02-22 ENCOUNTER — Ambulatory Visit (INDEPENDENT_AMBULATORY_CARE_PROVIDER_SITE_OTHER): Payer: Medicare HMO

## 2023-02-22 DIAGNOSIS — B351 Tinea unguium: Secondary | ICD-10-CM | POA: Diagnosis not present

## 2023-02-22 DIAGNOSIS — Z9889 Other specified postprocedural states: Secondary | ICD-10-CM

## 2023-02-22 DIAGNOSIS — M2012 Hallux valgus (acquired), left foot: Secondary | ICD-10-CM | POA: Diagnosis not present

## 2023-02-22 MED ORDER — FLUCONAZOLE 150 MG PO TABS: 150.0000 mg | ORAL_TABLET | ORAL | 0 refills | Status: DC

## 2023-02-24 NOTE — Progress Notes (Signed)
Subjective: Chief Complaint  Patient presents with   Routine Post Op    DOS 12/13/2022 LT FOOT SURGICAL CORRECTION OF BUNION (1ST MPJ FUSION)  "Think its been doing much better, ready to get rid of that boot"     Tamara Davidson is a 77 y.o. is seen today in office s/p left foot first MPJ arthrodesis preformed on 12/13/2022.  She states that she is doing much better.  She has tried getting back into a shoe not having any pain with this.  Occasional swelling.  Incisions well-healed.   He states that she is about this with the antifungal medication.  With that seem to be helping.  No side effects.   No fevers or chills.   Objective: General: No acute distress, AAOx3  DP/PT pulses palpable 2/4, CRT < 3 sec to all digits.  Protective sensation intact. Motor function intact.  Left foot: Scar is formed.  There is no tenderness palpation.  The toe is rectus.  There is no edema no erythema or warmth.  No signs of infection. Nails appear to be getting lighter in color.  There is clear on the proximal nail fold.  No edema, erythema or signs of infection to the right of the toenail sites. No other open lesions or pre-ulcerative lesions.  No pain with calf compression, swelling, warmth, erythema.   Assessment and Plan:  Status post left foot first MPJ arthrodesis, doing well with no complications; onychomycosis  -Treatment options discussed including all alternatives, risks, and complications -X-rays were obtained reviewed.  3 views of the foot were obtained.  Increased consolidation of hardware intact with any complicating factors. -We discussed gradual transition to regular shoe as tolerated.  She can gradually increase activity level when she is back into regular shoe.  Continue ice, elevate as well as compression. -She been doing well with fluconazole.  I have reordered this for her.  Continue to monitor for any side affects.   Return in about 6 weeks (around 04/05/2023).  [name] DPM

## 2023-02-26 ENCOUNTER — Encounter: Payer: Medicare HMO | Admitting: Podiatry

## 2023-04-05 ENCOUNTER — Ambulatory Visit (INDEPENDENT_AMBULATORY_CARE_PROVIDER_SITE_OTHER): Payer: Medicare HMO | Admitting: Podiatry

## 2023-04-05 ENCOUNTER — Ambulatory Visit (INDEPENDENT_AMBULATORY_CARE_PROVIDER_SITE_OTHER): Payer: Medicare HMO

## 2023-04-05 DIAGNOSIS — M2012 Hallux valgus (acquired), left foot: Secondary | ICD-10-CM

## 2023-04-05 DIAGNOSIS — B351 Tinea unguium: Secondary | ICD-10-CM

## 2023-04-05 MED ORDER — FLUCONAZOLE 150 MG PO TABS: 150.0000 mg | ORAL_TABLET | ORAL | 0 refills | Status: DC

## 2023-04-05 NOTE — Patient Instructions (Signed)
Fluconazole Tablets What is this medication? FLUCONAZOLE (floo KON na zole) prevents and treats fungal or yeast infections. It belongs to a group of medications called antifungals. It will not prevent or treat colds, the flu, or infections caused by bacteria or viruses. This medicine may be used for other purposes; ask your health care provider or pharmacist if you have questions. COMMON BRAND NAME(S): Diflucan What should I tell my care team before I take this medication? They need to know if you have any of these conditions: Irregular heartbeat or rhythm Kidney disease Liver disease Low levels of potassium in the blood An unusual or allergic reaction to fluconazole, other medications, foods, dyes, or preservatives Pregnant or trying to get pregnant Breastfeeding How should I use this medication? Take this medication by mouth. Take it as directed on the prescription label at the same time every day. You can take it with or without food. If it upsets your stomach, take it with food. Take all of this medication unless your care team tells you to stop it early. Keep taking it even if you think you are better. Talk to your care team about the use of this medication in children. While it may be prescribed for children as young as newborns for selected conditions, precautions do apply. Overdosage: If you think you have taken too much of this medicine contact a poison control center or emergency room at once. NOTE: This medicine is only for you. Do not share this medicine with others. What if I miss a dose? If you miss a dose, take it as soon as you can. If it is almost time for your next dose, take only that dose. Do not take double or extra doses. What may interact with this medication? Do not take this medication with any of the following: Adagrasib Flibanserin Lomitapide Lonafarnib Other medications that cause heart rhythm changes Triazolam This medication may also interact with the  following: Abrocitinib Certain antibiotics, such as rifabutin or rifampin Certain antivirals for HIV or hepatitis Certain medications for blood pressure, heart disease, irregular heartbeat Certain medications for cholesterol, such as atorvastatin, lovastatin, simvastatin Certain medications for depression, such as amitriptyline or nortriptyline Certain medications for diabetes, such as glipizide or glyburide Certain medications for seizures, such as carbamazepine or phenytoin Certain medications that treat or prevent blood clots, such as warfarin Certain opioid medications for pain, such as alfentanil, fentanyl, methadone Cyclophosphamide Cyclosporine Ibrutinib Lemborexant Midazolam NSAIDS, medications for pain and inflammation, such as ibuprofen or naproxen Olaparib Sirolimus Steroid medications, such as prednisone Tacrolimus Theophylline Tofacitinib Tolvaptan Vinblastine Vincristine Vitamin A Voriconazole This list may not describe all possible interactions. Give your health care provider a list of all the medicines, herbs, non-prescription drugs, or dietary supplements you use. Also tell them if you smoke, drink alcohol, or use illegal drugs. Some items may interact with your medicine. What should I watch for while using this medication? Visit your care team for regular checkups. If you are taking this medication for a long time you may need blood work. Tell your care team if your symptoms do not improve. Some fungal infections need many weeks or months of treatment to cure. Alcohol can increase possible damage to your liver. Avoid alcoholic drinks. If you have a vaginal infection, do not have sex until you have finished your treatment. You can wear a sanitary napkin. Do not use tampons. Wear freshly washed cotton, not synthetic, panties. What side effects may I notice from receiving this medication? Side effects that  you should report to your care team as soon as  possible: Allergic reactions--skin rash, itching, hives, swelling of the face, lips, tongue, or throat Heart rhythm changes--fast or irregular heartbeat, dizziness, feeling faint or lightheaded, chest pain, trouble breathing Liver injury--right upper belly pain, loss of appetite, nausea, light-colored stool, dark yellow or brown urine, yellowing skin or eyes, unusual weakness or fatigue Low adrenal gland function--nausea, vomiting, loss of appetite, unusual weakness or fatigue, dizziness Rash, fever, and swollen lymph nodes Redness, blistering, peeling, or loosening of the skin, including inside the mouth Seizures Side effects that usually do not require medical attention (report to your care team if they continue or are bothersome): Change in taste Diarrhea Dizziness Headache Nausea Stomach pain This list may not describe all possible side effects. Call your doctor for medical advice about side effects. You may report side effects to FDA at 1-800-FDA-1088. Where should I keep my medication? Keep out of the reach of children. Store at room temperature below 30 degrees C (86 degrees F). Throw away any medication after the expiration date. NOTE: This sheet is a summary. It may not cover all possible information. If you have questions about this medicine, talk to your doctor, pharmacist, or health care provider.  2024 Elsevier/Gold Standard (2022-12-20 00:00:00)

## 2023-04-05 NOTE — Progress Notes (Signed)
Subjective: Chief Complaint  Patient presents with   Routine Post Op    POV #7 DOS 12/13/2022 LT FOOT SURGICAL CORRECTION OF BUNION (1ST MPJ FUSION)     Tamara Davidson is a 77 y.o. is seen today in office s/p left foot first MPJ arthrodesis preformed on 12/13/2022.  She said that she is doing well and she is back to her regular shoes.  She has no pain and no soreness but she states "I know when it is going to rain".  There is swelling.  He states that she is about this with the antifungal medication.  With that seem to be helping.  No side effects.   No fevers or chills.   Objective: General: No acute distress, AAOx3  DP/PT pulses palpable 2/4, CRT < 3 sec to all digits.  Protective sensation intact. Motor function intact.  Left foot: Scar is formed.  There is no tenderness palpation.  The toe is rectus.  There is no edema no erythema or warmth.  No signs of infection. Nails appear to be getting lighter in color and clear along the proximal nail fold.  No edema, erythema or signs of infection.  No pain in the nails.  No other open lesions or pre-ulcerative lesions.  No pain with calf compression, swelling, warmth, erythema.   Assessment and Plan:  Status post left foot first MPJ arthrodesis, doing well with no complications; onychomycosis  -Treatment options discussed including all alternatives, risks, and complications -X-rays were obtained reviewed.  3 views of the foot were obtained.  Increased consolidation of hardware intact with any complicating factors.  Hardware intact. -Continue regular shoe gear.  Gradually increase activity as tolerated.  Continue ice, elevate as well as compression help the residual edema. -She been doing well with fluconazole.  She seems improving we will continue with this.  Return in about 3 months (around 07/06/2023) for nail fungus.   [name] DPM

## 2023-04-17 ENCOUNTER — Telehealth: Payer: Self-pay

## 2023-04-17 ENCOUNTER — Encounter: Payer: Self-pay | Admitting: Family

## 2023-04-17 ENCOUNTER — Ambulatory Visit (INDEPENDENT_AMBULATORY_CARE_PROVIDER_SITE_OTHER): Payer: Medicare HMO | Admitting: Family

## 2023-04-17 VITALS — Ht 61.0 in | Wt 112.0 lb

## 2023-04-17 DIAGNOSIS — Z0000 Encounter for general adult medical examination without abnormal findings: Secondary | ICD-10-CM | POA: Diagnosis not present

## 2023-04-17 NOTE — Telephone Encounter (Signed)
I connected with  Tamara Davidson on 04/17/23 by a video enabled telemedicine application and verified that I am speaking with the correct person using two identifiers.   I discussed the limitations of evaluation and management by telemedicine. The patient expressed understanding and agreed to proceed.

## 2023-04-17 NOTE — Progress Notes (Signed)
This service is provided via telemedicine  No vital signs collected/recorded due to the encounter was a telemedicine visit.   Location of patient (ex: home, work):  home  Patient consents to a telephone visit:  see consent dated 04/17/2023  Location of the provider (ex: office, home):  [facility]  Names of all persons participating in the telemedicine service and their role in the encounter:  patient, Richarda Blade, NP, [facility]   Time spent on call:  5 min      Subjective:   Tamara Davidson is a 77 y.o. female who presents for Medicare Annual (Subsequent) preventive examination.  Visit Complete: Virtual  I connected with  Simeon Craft on 04/17/23 by a video and audio enabled telemedicine application and verified that I am speaking with the correct person using two identifiers.  Patient Location: Home  Provider Location: Office/Clinic  I discussed the limitations of evaluation and management by telemedicine. The patient expressed understanding and agreed to proceed.  Patient Medicare AWV questionnaire was completed by the patient on 04/17/2023; I have confirmed that all information answered by patient is correct and no changes since this date.  Review of Systems    Cardiac Risk Factors include: advanced age (>59men, >2 women);hypertension;smoking/ tobacco exposure     Objective:    Today's Vitals   04/17/23 1009  Weight: 112 lb (50.8 kg)  Height: 5\' 1"  (1.549 m)   Body mass index is 21.16 kg/m.     04/17/2023   10:10 AM 05/11/2022    8:51 AM 04/21/2022   10:44 AM 04/12/2021    1:16 PM 10/18/2020    1:39 PM 04/15/2020   11:10 AM 04/09/2020   10:32 AM  Advanced Directives  Does Patient Have a Medical Advance Directive? Yes Yes Yes Yes Yes Yes Yes  Type of Advance Directive Out of facility DNR (pink MOST or yellow form);Healthcare Power of [employer] of [place];Living will Healthcare Power of  [place];Living will Healthcare Power of [place];Living will Out of facility DNR (pink MOST or yellow form) Living will;Out of facility DNR (pink MOST or yellow form) Living will;Healthcare Power of Attorney  Does patient want to make changes to medical advance directive? No - Patient declined No - Patient declined No - Patient declined No - Patient declined No - Patient declined No - Patient declined No - Patient declined  Copy of Healthcare Power of Attorney in Chart? Yes - validated most recent copy scanned in chart (See row information) Yes - validated most recent copy scanned in chart (See row information) Yes - validated most recent copy scanned in chart (See row information) Yes - validated most recent copy scanned in chart (See row information)  Yes - validated most recent copy scanned in chart (See row information) Yes - validated most recent copy scanned in chart (See row information)  Pre-existing out of facility DNR order (yellow form or pink MOST form)      Pink MOST/Yellow Form most recent copy in chart - Physician notified to receive inpatient order     Current Medications (verified) Outpatient Encounter Medications as of 04/17/2023  Medication Sig   aspirin 81 MG tablet Take 81 mg by mouth daily.    Azelastine HCl (ASTEPRO) 0.15 % SOLN Place 2 sprays into both nostrils every morning.   B Complex Vitamins (B COMPLEX PO) Take 1 tablet by mouth daily.   calcium carbonate (OSCAL) 1500 (600 Ca) MG TABS tablet Take 1 tablet by mouth  2 (two) times daily with a meal.   cholecalciferol (VITAMIN D) 400 UNITS TABS tablet Take 400 Units by mouth daily.    fluconazole (DIFLUCAN) 150 MG tablet Take 1 tablet (150 mg total) by mouth once a week.   Glucosamine-Chondroit-Vit C-Mn (GLUCOSAMINE CHONDR 1500 COMPLX PO) Take by mouth daily.   guaiFENesin (MUCINEX) 600 MG 12 hr tablet Take 600 mg by mouth daily.    levocetirizine (XYZAL) 5 MG tablet Take 5 mg by mouth every evening.    lisinopril-hydrochlorothiazide (ZESTORETIC) 10-12.5 MG tablet TAKE 1 TABLET DAILY   Multiple Vitamins-Minerals (MULTIVITAMIN WITH MINERALS) tablet Take 1 tablet by mouth daily.   Probiotic Product (PROBIOTIC DAILY PO) Take 1 tablet by mouth daily.    promethazine (PHENERGAN) 25 MG tablet Take 1 tablet (25 mg total) by mouth every 8 (eight) hours as needed for nausea or vomiting.   rosuvastatin (CRESTOR) 5 MG tablet TAKE ONE-HALF (1/2) TABLET AT BEDTIME ON MONDAY, WEDNESDAY, AND FRIDAY (AVOID GRAPEFRUIT PRODUCTS)   vitamin C (ASCORBIC ACID) 500 MG tablet Take 500 mg by mouth daily.   No facility-administered encounter medications on file as of 04/17/2023.    Allergies (verified) Chocolate, Chocolate flavor, Iodine, Oxycodone, Penicillins, Povidone iodine, Shellfish allergy, Statins, Strawberry (diagnostic), and Strawberry extract   History: Past Medical History:  Diagnosis Date   Disorder of bone and cartilage, unspecified    Disorders of bursae and tendons in shoulder region, unspecified    Diverticulitis of colon (without mention of hemorrhage)(562.11)    Essential hypertension, benign    Ganglion of tendon sheath    Lumbago    Other abnormal blood chemistry    Other and unspecified hyperlipidemia    Premenopausal menorrhagia    Sebaceous cyst    Shortness of breath    Past Surgical History:  Procedure Laterality Date   DG FOOT LEFT COMPLETE ([facility] HX) Left 12/13/2022   DG FOOT RIGHT COMPLETE ([facility] HX) Right 06/01/2022   ROTATOR CUFF REPAIR Left 10/06/2013   tendon and ligament repair   Family History  Problem Relation Age of Onset   Lung cancer Mother    Osteoporosis Mother    Coronary artery disease Father    Stroke Sister    Social History   Socioeconomic History   Marital status: Married    Spouse name: Not on file   Number of children: Not on file   Years of education: Not on file   Highest education level: Not on file  Occupational History   Not on file   Tobacco Use   Smoking status: Former    Packs/day: 1.00    Years: 5.00    Additional pack years: 0.00    Total pack years: 5.00    Types: Cigarettes    Start date: 10/30/1969   Smokeless tobacco: Never  Vaping Use   Vaping Use: Never used  Substance and Sexual Activity   Alcohol use: No    Alcohol/week: 0.0 standard drinks of alcohol   Drug use: No   Sexual activity: Not on file  Other Topics Concern   Not on file  Social History Narrative   Married 23 years, with Jomarie Longs for 29 years   Social Determinants of Health   Financial Resource Strain: Low Risk  (04/08/2018)   Overall Financial Resource Strain (CARDIA)    Difficulty of Paying Living Expenses: Not hard at all  Food Insecurity: No Food Insecurity (04/08/2018)   Hunger Vital Sign    Worried About Programme researcher, broadcasting/film/video in  the Last Year: Never true    Ran Out of Food in the Last Year: Never true  Transportation Needs: No Transportation Needs (04/08/2018)   PRAPARE - Administrator, Civil Service (Medical): No    Lack of Transportation (Non-Medical): No  Physical Activity: Insufficiently Active (04/08/2018)   Exercise Vital Sign    Days of Exercise per Week: 3 days    Minutes of Exercise per Session: 40 min  Stress: No Stress Concern Present (04/08/2018)   [employer] of Occupational Health - Occupational Stress Questionnaire    Feeling of Stress : Not at all  Social Connections: Moderately Integrated (04/08/2018)   Social Connection and Isolation Panel [NHANES]    Frequency of Communication with Friends and Family: More than three times a week    Frequency of Social Gatherings with Friends and Family: More than three times a week    Attends Religious Services: More than 4 times per year    Active Member of [employer] or Organizations: No    Attends Engineer, structural: Never    Marital Status: Married    Tobacco Counseling Counseling given: Not Answered   Clinical Intake:  Pre-visit  preparation completed: No        BMI - recorded: 21.19 Nutritional Status: BMI of 19-24  Normal Nutritional Risks: None Diabetes: No  How often do you need to have someone help you when you read instructions, pamphlets, or other written materials from your doctor or pharmacy?: 1 - Never What is the last grade level you completed in school?: 4 yrs college  Interpreter Needed?: No      Activities of Daily Living    04/17/2023    1:32 PM 04/21/2022   10:53 AM  In your present state of health, do you have any difficulty performing the following activities:  Hearing? 0 0  Vision? 0 0  Difficulty concentrating or making decisions? 0 0  Walking or climbing stairs? 0 0  Dressing or bathing? 0 0  Doing errands, shopping? 0 0  Preparing Food and eating ? N N  Using the Toilet? N N  In the past six months, have you accidently leaked urine? N N  Do you have problems with loss of bowel control? N N  Managing your Medications? N N  Managing your Finances? N N  Housekeeping or managing your Housekeeping? N N    Patient Care Team: [name], NP as PCP - General (Adult Health Nurse Practitioner) Beverely Low, MD as Consulting Physician (Orthopedic Surgery) Mateo Flow, MD as Consulting Physician (Ophthalmology)  Indicate any recent Medical Services you may have received from other than [facility] providers in the past year (date may be approximate).     Assessment:   This is a routine wellness examination for Rashana.  Hearing/Vision screen No results found.  Dietary issues and exercise activities discussed:     Goals Addressed             This Visit's Progress    Lose Belly Fat   On track    Starting today pt will increase gym days to 4 days a week.        Depression Screen    04/17/2023   10:12 AM 11/16/2022   10:33 AM 04/21/2022   10:41 AM 04/12/2021    1:13 PM 10/18/2020    1:39 PM 04/15/2020   11:09 AM 04/09/2020   10:29 AM  PHQ 2/9 Scores  PHQ - 2  Score 0 0  0 0 0 0 0  Exception Documentation Other- indicate reason in comment box        Not completed Annual wellness visit          Fall Risk    04/17/2023   10:12 AM 11/16/2022   10:33 AM 05/11/2022    8:51 AM 04/21/2022   10:41 AM 10/27/2021   11:08 AM  Fall Risk   Falls in the past year? 0 0 0 0 0  Number falls in past yr: 0 0 0 0 0  Injury with Fall? 0 0 0 0 0  Risk for fall due to : No Fall Risks No Fall Risks No Fall Risks No Fall Risks No Fall Risks  Follow up Falls evaluation completed Falls evaluation completed Falls evaluation completed Falls evaluation completed Falls evaluation completed;Education provided;Falls prevention discussed    MEDICARE RISK AT HOME:   TIMED UP AND GO:  Was the test performed?  No    Cognitive Function:    04/09/2019    9:04 AM 04/08/2018    9:07 AM 03/21/2017    1:16 PM 02/10/2016    1:52 PM 07/13/2014    1:51 PM  MMSE - Mini Mental State Exam  Orientation to time 5 5 5 5 5   Orientation to Place 5 5 5 5 5   Registration 3 3 3 3 3   Attention/ Calculation 5 5 5 5 5   Recall 3 3 3 3 2   Language- name 2 objects 2 2 2 2 2   Language- repeat 1 1 1 1 1   Language- follow 3 step command 3 3 3 3 3   Language- read & follow direction 1 1 1 1 1   Write a sentence 1 1 1 1 1   Copy design 1 1 1 1 1   Total score 30 30 30 30 29         04/17/2023   11:40 AM 04/21/2022   10:42 AM 04/12/2021    1:14 PM 04/09/2020   10:30 AM  6CIT Screen  What Year? 0 points 0 points 0 points 0 points  What month? 0 points 0 points 0 points 0 points  What time? 0 points 0 points 0 points 0 points  Count back from 20 0 points 0 points 0 points 0 points  Months in reverse 0 points 0 points 0 points 0 points  Repeat phrase 0 points 2 points 0 points 2 points  Total Score 0 points 2 points 0 points 2 points    Immunizations Immunization History  Administered Date(s) Administered   Fluad Quad(high Dose 65+) 07/21/2019, 07/20/2020, 08/30/2021, 08/29/2022   Influenza  Whole 07/28/2010, 07/11/2012   Influenza, High Dose Seasonal PF 07/12/2017, 07/22/2018   Influenza,inj,Quad PF,6+ Mos 07/13/2014, 08/12/2015, 06/15/2016   Influenza-Unspecified 07/30/2013   PFIZER Comirnaty(Gray Top)Covid-19 Tri-Sucrose Vaccine 08/31/2022, 09/05/2022   PFIZER(Purple Top)SARS-COV-2 Vaccination 11/21/2019, 12/12/2019, 06/30/2020, 02/14/2021, 10/03/2021   Pneumococcal Conjugate-13 10/08/2014   Pneumococcal Polysaccharide-23 10/30/2010, 12/06/2010   Tdap 10/15/2007, 10/30/2010, 11/07/2017   Zoster Recombinat (Shingrix) 09/26/2017, 11/30/2017   Zoster, Live 01/25/2013    TDAP status: Up to date  Flu Vaccine status: Up to date  Pneumococcal vaccine status: Up to date  Covid-19 vaccine status: Information provided on how to obtain vaccines.   Qualifies for Shingles Vaccine? Yes   Zostavax completed No   Shingrix Completed?: Yes  Screening Tests Health Maintenance  Topic Date Due   COVID-19 Vaccine (7 - 2023-24 season) 07/02/2023 (Originally 10/31/2022)   INFLUENZA VACCINE  05/31/2023   Medicare  Annual Wellness (AWV)  04/16/2024   DTaP/Tdap/Td (4 - Td or Tdap) 11/08/2027   Pneumonia Vaccine 83+ Years old  Completed   DEXA SCAN  Completed   Hepatitis C Screening  Completed   Zoster Vaccines- Shingrix  Completed   HPV VACCINES  Aged Out   Colonoscopy  Discontinued    Health Maintenance  There are no preventive care reminders to display for this patient.   Colorectal cancer screening: No longer required.   Mammogram status: Completed per pt completed Feb,2024 at GYN office will obtain records. Repeat every year  Bone Density status: Completed 12/02/2021. Results reflect: Bone density results: NORMAL. Repeat every 5 years.  Lung Cancer Screening: (Low Dose CT Chest recommended if Age 2-80 years, 20 pack-year currently smoking OR have quit w/in 15years.) does not qualify.   Lung Cancer Screening Referral: No   Additional Screening:  Hepatitis C Screening:  does qualify; Completed Yes   Vision Screening: Recommended annual ophthalmology exams for early detection of glaucoma and other disorders of the eye. Is the patient up to date with their annual eye exam?  Yes  Who is the provider or what is the name of the office in which the patient attends annual eye exams? Dr.Richard Groat  If pt is not established with a provider, would they like to be referred to a provider to establish care? No .   Dental Screening: Recommended annual dental exams for proper oral hygiene  Diabetic Foot Exam: Diabetic Foot Exam: Completed N/A   Community Resource Referral / Chronic Care Management: CRR required this visit?  No   CCM required this visit?  No     Plan:     I have personally reviewed and noted the following in the patient's chart:   Medical and social history Use of alcohol, tobacco or illicit drugs  Current medications and supplements including opioid prescriptions. Patient is not currently taking opioid prescriptions. Functional ability and status Nutritional status Physical activity Advanced directives List of other physicians Hospitalizations, surgeries, and ER visits in previous 12 months Vitals Screenings to include cognitive, depression, and falls Referrals and appointments  In addition, I have reviewed and discussed with patient certain preventive protocols, quality metrics, and best practice recommendations. A written personalized care plan for preventive services as well as general preventive health recommendations were provided to patient.     Caesar Bookman, NP   04/17/2023   After Visit Summary: (MyChart) Due to this being a telephonic visit, the after visit summary with patients personalized plan was offered to patient via MyChart   Nurse Notes: will obtain medical records for mammogram done at GYN office.

## 2023-04-17 NOTE — Patient Instructions (Signed)
[name] , Thank you for taking time to come for your Medicare Wellness Visit. I appreciate your ongoing commitment to your health goals. Please review the following plan we discussed and let me know if I can assist you in the future.   Screening recommendations/referrals: Colonoscopy : N/A   Mammogram : Up to date   Bone Density : Up to date   Recommended yearly ophthalmology/optometry visit for glaucoma screening and checkup Recommended yearly dental visit for hygiene and checkup  Vaccinations: Influenza vaccine- due annually in September/October Pneumococcal vaccine : Up to date   Tdap vaccine : Up to date   Shingles vaccine: Up to date     Advanced directives: yes   Conditions/risks identified: Cardiac Risk Factors include: advanced age (>37men, >62 women);hypertension;smoking/ tobacco exposure  Next appointment: 1 year    Preventive Care 77 Years and Older, Female Preventive care refers to lifestyle choices and visits with your health care provider that can promote health and wellness. What does preventive care include? A yearly physical exam. This is also called an annual well check. Dental exams once or twice a year. Routine eye exams. Ask your health care provider how often you should have your eyes checked. Personal lifestyle choices, including: Daily care of your teeth and gums. Regular physical activity. Eating a healthy diet. Avoiding tobacco and drug use. Limiting alcohol use. Practicing safe sex. Taking low-dose aspirin every day. Taking vitamin and mineral supplements as recommended by your health care provider. What happens during an annual well check? The services and screenings done by your health care provider during your annual well check will depend on your age, overall health, lifestyle risk factors, and family history of disease. Counseling  Your health care provider may ask you questions about your: Alcohol use. Tobacco use. Drug use. Emotional  well-being. Home and relationship well-being. Sexual activity. Eating habits. History of falls. Memory and ability to understand (cognition). Work and work Astronomer. Reproductive health. Screening  You may have the following tests or measurements: Height, weight, and BMI. Blood pressure. Lipid and cholesterol levels. These may be checked every 5 years, or more frequently if you are over 77 years old. Skin check. Lung cancer screening. You may have this screening every year starting at age 77 if you have a 30-pack-year history of smoking and currently smoke or have quit within the past 15 years. Fecal occult blood test (FOBT) of the stool. You may have this test every year starting at age 77. Flexible sigmoidoscopy or colonoscopy. You may have a sigmoidoscopy every 5 years or a colonoscopy every 10 years starting at age 77. Hepatitis C blood test. Hepatitis B blood test. Sexually transmitted disease (STD) testing. Diabetes screening. This is done by checking your blood sugar (glucose) after you have not eaten for a while (fasting). You may have this done every 1-3 years. Bone density scan. This is done to screen for osteoporosis. You may have this done starting at age 77. Mammogram. This may be done every 77-2 years. Talk to your health care provider about how often you should have regular mammograms. Talk with your health care provider about your test results, treatment options, and if necessary, the need for more tests. Vaccines  Your health care provider may recommend certain vaccines, such as: Influenza vaccine. This is recommended every year. Tetanus, diphtheria, and acellular pertussis (Tdap, Td) vaccine. You may need a Td booster every 10 years. Zoster vaccine. You may need this after age 77. Pneumococcal 13-valent conjugate (PCV13)  vaccine. One dose is recommended after age 77. Pneumococcal polysaccharide (PPSV23) vaccine. One dose is recommended after age 77. Talk to your  health care provider about which screenings and vaccines you need and how often you need them. This information is not intended to replace advice given to you by your health care provider. Make sure you discuss any questions you have with your health care provider. Document Released: 11/12/2015 Document Revised: 07/05/2016 Document Reviewed: 08/17/2015 Elsevier Interactive Patient Education  2017 [employer].  Fall Prevention in the Home Falls can cause injuries. They can happen to people of all ages. There are many things you can do to make your home safe and to help prevent falls. What can I do on the outside of my home? Regularly fix the edges of walkways and driveways and fix any cracks. Remove anything that might make you trip as you walk through a door, such as a raised step or threshold. Trim any bushes or trees on the path to your home. Use bright outdoor lighting. Clear any walking paths of anything that might make someone trip, such as rocks or tools. Regularly check to see if handrails are loose or broken. Make sure that both sides of any steps have handrails. Any raised decks and porches should have guardrails on the edges. Have any leaves, snow, or ice cleared regularly. Use sand or salt on walking paths during winter. Clean up any spills in your garage right away. This includes oil or grease spills. What can I do in the bathroom? Use night lights. Install grab bars by the toilet and in the tub and shower. Do not use towel bars as grab bars. Use non-skid mats or decals in the tub or shower. If you need to sit down in the shower, use a plastic, non-slip stool. Keep the floor dry. Clean up any water that spills on the floor as soon as it happens. Remove soap buildup in the tub or shower regularly. Attach bath mats securely with double-sided non-slip rug tape. Do not have throw rugs and other things on the floor that can make you trip. What can I do in the bedroom? Use night  lights. Make sure that you have a light by your bed that is easy to reach. Do not use any sheets or blankets that are too big for your bed. They should not hang down onto the floor. Have a firm chair that has side arms. You can use this for support while you get dressed. Do not have throw rugs and other things on the floor that can make you trip. What can I do in the kitchen? Clean up any spills right away. Avoid walking on wet floors. Keep items that you use a lot in easy-to-reach places. If you need to reach something above you, use a strong step stool that has a grab bar. Keep electrical cords out of the way. Do not use floor polish or wax that makes floors slippery. If you must use wax, use non-skid floor wax. Do not have throw rugs and other things on the floor that can make you trip. What can I do with my stairs? Do not leave any items on the stairs. Make sure that there are handrails on both sides of the stairs and use them. Fix handrails that are broken or loose. Make sure that handrails are as long as the stairways. Check any carpeting to make sure that it is firmly attached to the stairs. Fix any carpet that is loose  or worn. Avoid having throw rugs at the top or bottom of the stairs. If you do have throw rugs, attach them to the floor with carpet tape. Make sure that you have a light switch at the top of the stairs and the bottom of the stairs. If you do not have them, ask someone to add them for you. What else can I do to help prevent falls? Wear shoes that: Do not have high heels. Have rubber bottoms. Are comfortable and fit you well. Are closed at the toe. Do not wear sandals. If you use a stepladder: Make sure that it is fully opened. Do not climb a closed stepladder. Make sure that both sides of the stepladder are locked into place. Ask someone to hold it for you, if possible. Clearly mark and make sure that you can see: Any grab bars or handrails. First and last  steps. Where the edge of each step is. Use tools that help you move around (mobility aids) if they are needed. These include: Canes. Walkers. Scooters. Crutches. Turn on the lights when you go into a dark area. Replace any light bulbs as soon as they burn out. Set up your furniture so you have a clear path. Avoid moving your furniture around. If any of your floors are uneven, fix them. If there are any pets around you, be aware of where they are. Review your medicines with your doctor. Some medicines can make you feel dizzy. This can increase your chance of falling. Ask your doctor what other things that you can do to help prevent falls. This information is not intended to replace advice given to you by your health care provider. Make sure you discuss any questions you have with your health care provider. Document Released: 08/12/2009 Document Revised: 03/23/2016 Document Reviewed: 11/20/2014 Elsevier Interactive Patient Education  2017 [employer].

## 2023-04-23 ENCOUNTER — Ambulatory Visit: Payer: Medicare HMO | Admitting: Family

## 2023-04-24 ENCOUNTER — Other Ambulatory Visit: Payer: Self-pay

## 2023-04-24 DIAGNOSIS — E7800 Pure hypercholesterolemia, unspecified: Secondary | ICD-10-CM

## 2023-04-24 DIAGNOSIS — I10 Essential (primary) hypertension: Secondary | ICD-10-CM

## 2023-04-24 DIAGNOSIS — R7303 Prediabetes: Secondary | ICD-10-CM

## 2023-05-16 ENCOUNTER — Encounter: Payer: Self-pay | Admitting: Orthopedic Surgery

## 2023-05-17 ENCOUNTER — Other Ambulatory Visit: Payer: Self-pay | Admitting: Orthopedic Surgery

## 2023-05-17 DIAGNOSIS — R7303 Prediabetes: Secondary | ICD-10-CM

## 2023-05-21 ENCOUNTER — Other Ambulatory Visit: Payer: Medicare HMO

## 2023-05-21 DIAGNOSIS — R7303 Prediabetes: Secondary | ICD-10-CM

## 2023-05-21 LAB — CBC WITH DIFFERENTIAL/PLATELET
Basophils Relative: 0.2 %
HCT: 41.1 % (ref 35.0–45.0)
Hemoglobin: 13.6 g/dL (ref 11.7–15.5)
Lymphs Abs: 2952 cells/uL (ref 850–3900)
MCH: 30.8 pg (ref 27.0–33.0)
MCHC: 33.1 g/dL (ref 32.0–36.0)
MCV: 93 fL (ref 80.0–100.0)
MPV: 10.1 fL (ref 7.5–12.5)
Monocytes Relative: 6.6 %
Platelets: 235 10*3/uL (ref 140–400)
RBC: 4.42 10*6/uL (ref 3.80–5.10)
RDW: 13 % (ref 11.0–15.0)
WBC: 6 10*3/uL (ref 3.8–10.8)

## 2023-05-22 LAB — COMPLETE METABOLIC PANEL WITH GFR
AG Ratio: 1.4 (calc) (ref 1.0–2.5)
ALT: 14 U/L (ref 6–29)
AST: 19 U/L (ref 10–35)
Albumin: 4.2 g/dL (ref 3.6–5.1)
Alkaline phosphatase (APISO): 93 U/L (ref 37–153)
BUN: 18 mg/dL (ref 7–25)
CO2: 30 mmol/L (ref 20–32)
Calcium: 9.6 mg/dL (ref 8.6–10.4)
Chloride: 100 mmol/L (ref 98–110)
Creat: 0.79 mg/dL (ref 0.60–1.00)
Globulin: 3.1 g/dL (calc) (ref 1.9–3.7)
Glucose, Bld: 96 mg/dL (ref 65–99)
Potassium: 4.2 mmol/L (ref 3.5–5.3)
Sodium: 138 mmol/L (ref 135–146)
Total Bilirubin: 0.4 mg/dL (ref 0.2–1.2)
Total Protein: 7.3 g/dL (ref 6.1–8.1)
eGFR: 77 mL/min/{1.73_m2} (ref >=60)

## 2023-05-22 LAB — MICROALBUMIN / CREATININE URINE RATIO
Creatinine, Urine: 22 mg/dL (ref 20–275)
Microalb, Ur: <0.2 mg/dL

## 2023-05-22 LAB — CBC WITH DIFFERENTIAL/PLATELET
Absolute Monocytes: 396 cells/uL (ref 200–950)
Basophils Absolute: 12 cells/uL (ref 0–200)
Eosinophils Absolute: 72 cells/uL (ref 15–500)
Eosinophils Relative: 1.2 %
Neutro Abs: 2568 cells/uL (ref 1500–7800)
Neutrophils Relative %: 42.8 %
Total Lymphocyte: 49.2 %

## 2023-05-22 LAB — LIPID PANEL
Cholesterol: 182 mg/dL (ref <200)
HDL: 67 mg/dL (ref >=50)
LDL Cholesterol (Calc): 93 mg/dL (calc)
Non-HDL Cholesterol (Calc): 115 mg/dL (calc) (ref <130)
Total CHOL/HDL Ratio: 2.7 (calc) (ref <5.0)
Triglycerides: 122 mg/dL (ref <150)

## 2023-05-22 LAB — HEMOGLOBIN A1C
Hgb A1c MFr Bld: 5.9 % of total Hgb — ABNORMAL HIGH (ref <5.7)
Mean Plasma Glucose: 123 mg/dL
eAG (mmol/L): 6.8 mmol/L

## 2023-05-24 ENCOUNTER — Ambulatory Visit: Payer: Medicare HMO | Admitting: Orthopedic Surgery

## 2023-05-24 ENCOUNTER — Encounter: Payer: Self-pay | Admitting: Orthopedic Surgery

## 2023-05-24 ENCOUNTER — Ambulatory Visit
Admission: RE | Admit: 2023-05-24 | Discharge: 2023-05-24 | Disposition: A | Discharge status: Home / Self Care | Payer: Medicare HMO | Source: Ambulatory Visit | Attending: Orthopedic Surgery | Admitting: Orthopedic Surgery

## 2023-05-24 VITALS — BP 110/60 | HR 92 | Temp 97.5°F | Resp 16 | Ht 61.0 in | Wt 111.6 lb

## 2023-05-24 DIAGNOSIS — I10 Essential (primary) hypertension: Secondary | ICD-10-CM

## 2023-05-24 DIAGNOSIS — R053 Chronic cough: Secondary | ICD-10-CM

## 2023-05-24 DIAGNOSIS — I700 Atherosclerosis of aorta: Secondary | ICD-10-CM

## 2023-05-24 DIAGNOSIS — E7800 Pure hypercholesterolemia, unspecified: Secondary | ICD-10-CM | POA: Diagnosis not present

## 2023-05-24 DIAGNOSIS — R7303 Prediabetes: Secondary | ICD-10-CM

## 2023-05-24 NOTE — Progress Notes (Signed)
Careteam: Patient Care Team: [name], NP as PCP - General (Adult Health Nurse Practitioner) Beverely Low, MD as Consulting Physician (Orthopedic Surgery) Mateo Flow, MD as Consulting Physician (Ophthalmology)  Seen by: Hazle Nordmann, AGNP-C  PLACE OF SERVICE:  [facility]  Advanced Directive information Does Patient Have a Medical Advance Directive?: Yes, Type of Advance Directive: Healthcare Power of [place];Out of facility DNR (pink MOST or yellow form), Does patient want to make changes to medical advance directive?: No - Patient declined  Allergies  Allergen Reactions   Chocolate Diarrhea   Chocolate Flavor    Iodine    Oxycodone Itching   Penicillins    Povidone Iodine    Shellfish Allergy    Statins Other (See Comments)    High LFT's   Strawberry (Diagnostic) Itching   Strawberry Extract     Chief Complaint  Patient presents with   Medical Management of Chronic Issues    6 month follow up.    Health Maintenance    Discuss the need for Lung Cancer Screening.      HPI: Patient is a 77 y.o. female seen today for medical management of chronic conditions.   Labs discussed with patient.   No health concerns today.   Family h/o lung cancer. Heavy smoker in her 20's> quit by the age 37. Second hand smoke exposure as a child. Continues to have chronic cough. Dr. Renato Gails use to order CXR every couple years.   A1c 5.9> was 5.9. Urine microalbumin normal. No hypoglycemias.  Review of Systems:  Review of Systems  Constitutional: Negative.   HENT: Negative.    Eyes: Negative.   Respiratory: Negative.    Cardiovascular: Negative.   Gastrointestinal: Negative.   Genitourinary: Negative.   Musculoskeletal: Negative.   Skin: Negative.   Neurological: Negative.   Endo/Heme/Allergies: Negative.   Psychiatric/Behavioral: Negative.      Past Medical History:  Diagnosis Date   Disorder of bone and cartilage, unspecified    Disorders of bursae and tendons in  shoulder region, unspecified    Diverticulitis of colon (without mention of hemorrhage)(562.11)    Essential hypertension, benign    Ganglion of tendon sheath    Lumbago    Other abnormal blood chemistry    Other and unspecified hyperlipidemia    Premenopausal menorrhagia    Sebaceous cyst    Shortness of breath    Past Surgical History:  Procedure Laterality Date   DG FOOT LEFT COMPLETE ([facility] HX) Left 12/13/2022   DG FOOT RIGHT COMPLETE ([facility] HX) Right 06/01/2022   ROTATOR CUFF REPAIR Left 10/06/2013   tendon and ligament repair   Social History:   reports that she has quit smoking. Her smoking use included cigarettes. She started smoking about 53 years ago. She has a 53.6 pack-year smoking history. She has never used smokeless tobacco. She reports that she does not drink alcohol and does not use drugs.  Family History  Problem Relation Age of Onset   Lung cancer Mother    Osteoporosis Mother    Coronary artery disease Father    Stroke Sister     Medications: Patient's Medications  New Prescriptions   No medications on file  Previous Medications   ASPIRIN 81 MG TABLET    Take 81 mg by mouth daily.    AZELASTINE HCL (ASTEPRO) 0.15 % SOLN    Place 2 sprays into both nostrils every morning.   B COMPLEX VITAMINS (B COMPLEX PO)    Take 1  tablet by mouth daily.   CALCIUM CARBONATE (OSCAL) 1500 (600 CA) MG TABS TABLET    Take 1 tablet by mouth 2 (two) times daily with a meal.   CHOLECALCIFEROL (VITAMIN D) 400 UNITS TABS TABLET    Take 400 Units by mouth daily.    FLUCONAZOLE (DIFLUCAN) 150 MG TABLET    Take 1 tablet (150 mg total) by mouth once a week.   GLUCOSAMINE-CHONDROIT-VIT C-MN (GLUCOSAMINE CHONDR 1500 COMPLX PO)    Take by mouth daily.   GUAIFENESIN (MUCINEX) 600 MG 12 HR TABLET    Take 600 mg by mouth daily.    LEVOCETIRIZINE (XYZAL) 5 MG TABLET    Take 5 mg by mouth every evening.   LISINOPRIL-HYDROCHLOROTHIAZIDE (ZESTORETIC) 10-12.5 MG TABLET    TAKE 1 TABLET DAILY    MULTIPLE VITAMINS-MINERALS (MULTIVITAMIN WITH MINERALS) TABLET    Take 1 tablet by mouth daily.   PROBIOTIC PRODUCT (PROBIOTIC DAILY PO)    Take 1 tablet by mouth daily.    ROSUVASTATIN (CRESTOR) 5 MG TABLET    TAKE ONE-HALF (1/2) TABLET AT BEDTIME ON MONDAY, WEDNESDAY, AND FRIDAY (AVOID GRAPEFRUIT PRODUCTS)   VITAMIN C (ASCORBIC ACID) 500 MG TABLET    Take 500 mg by mouth daily.  Modified Medications   No medications on file  Discontinued Medications   PROMETHAZINE (PHENERGAN) 25 MG TABLET    Take 1 tablet (25 mg total) by mouth every 8 (eight) hours as needed for nausea or vomiting.    Physical Exam:  Vitals:   05/24/23 1021  BP: 110/60  Pulse: 92  Resp: 16  Temp: (!) 97.5 F (36.4 C)  SpO2: 96%  Weight: 111 lb 9.6 oz (50.6 kg)  Height: 5\' 1"  (1.549 m)   Body mass index is 21.09 kg/m. Wt Readings from Last 3 Encounters:  05/24/23 111 lb 9.6 oz (50.6 kg)  04/17/23 112 lb (50.8 kg)  11/16/22 112 lb 2 oz (50.9 kg)    Physical Exam Vitals reviewed.  Constitutional:      General: She is not in acute distress. HENT:     Head: Normocephalic.  Eyes:     General:        Right eye: No discharge.        Left eye: No discharge.  Cardiovascular:     Rate and Rhythm: Normal rate and regular rhythm.     Pulses: Normal pulses.     Heart sounds: Normal heart sounds.  Pulmonary:     Effort: Pulmonary effort is normal. No respiratory distress.     Breath sounds: Normal breath sounds. No wheezing.  Abdominal:     General: Bowel sounds are normal.     Palpations: Abdomen is soft.  Musculoskeletal:     Cervical back: Neck supple.     Right lower leg: No edema.     Left lower leg: No edema.  Skin:    General: Skin is warm.     Capillary Refill: Capillary refill takes less than 2 seconds.  Neurological:     General: No focal deficit present.     Mental Status: She is alert and oriented to person, place, and time.  Psychiatric:        Mood and Affect: Mood normal.         Behavior: Behavior normal.     Labs reviewed: Basic Metabolic Panel: Recent Labs    05/21/23 0932  NA 138  K 4.2  CL 100  CO2 30  GLUCOSE 96  BUN 18  CREATININE 0.79  CALCIUM 9.6   Liver Function Tests: Recent Labs    09/15/22 1134 12/11/22 1102 05/21/23 0932  AST 16 20 19   ALT 13 15 14   ALKPHOS  --  103  --   BILITOT 0.5 0.4 0.4  PROT 7.4 7.3 7.3  ALBUMIN  --  4.5  --    No results for input(s): "LIPASE", "AMYLASE" in the last 8760 hours. No results for input(s): "AMMONIA" in the last 8760 hours. CBC: Recent Labs    09/15/22 1134 12/11/22 1102 05/21/23 0932  WBC 6.0 7.4 6.0  NEUTROABS 2,430 3.6 2,568  HGB 13.4 13.3 13.6  HCT 39.3 39.2 41.1  MCV 91.2 90 93.0  PLT 241 233 235   Lipid Panel: Recent Labs    11/13/22 0930 05/21/23 0932  CHOL 182 182  HDL 65 67  LDLCALC 101* 93  TRIG 70 122  CHOLHDL 2.8 2.7   TSH: No results for input(s): "TSH" in the last 8760 hours. A1C: Lab Results  Component Value Date   HGBA1C 5.9 (H) 05/21/2023     Assessment/Plan 1. Essential hypertension, benign - controlled - cont lisinopril-HCTZ  2. Chronic cough - smoker in 20's x 5 days> quit by age 37 - does not qualify for low dose CT chest  - non productive cough - lung sounds clear - DG Chest 2 View; Future  3. Prediabetes - A1c 5.9 - diet controlled - cont asa, ACE and statin  4. Pure hypercholesterolemia - LDL 93 - cont rosuvastatin  5. Aortic Atherosclerosis (HCC) - cont asa and statin  Total time: 31 minutes. Greater than 50% of total time spent doing patient education regarding health maintenance, prediabetes, HLD, and cough.     Next appt: Visit date not found  Orvan Papadakis Scherry Ran  [facility] [phone]

## 2023-05-24 NOTE — Patient Instructions (Signed)
Chest xray scheduled with [facility] [address]> you may go anytime to have it done

## 2023-05-28 ENCOUNTER — Other Ambulatory Visit: Payer: Self-pay | Admitting: Podiatry

## 2023-05-28 DIAGNOSIS — Z79899 Other long term (current) drug therapy: Secondary | ICD-10-CM

## 2023-06-01 ENCOUNTER — Other Ambulatory Visit: Payer: Self-pay | Admitting: Podiatry

## 2023-06-01 MED ORDER — FLUCONAZOLE 150 MG PO TABS: 150.0000 mg | ORAL_TABLET | ORAL | 0 refills | Status: DC

## 2023-07-06 ENCOUNTER — Encounter: Payer: Self-pay | Admitting: Podiatry

## 2023-07-06 ENCOUNTER — Ambulatory Visit: Payer: Medicare HMO | Admitting: Podiatry

## 2023-07-06 DIAGNOSIS — B351 Tinea unguium: Secondary | ICD-10-CM | POA: Diagnosis not present

## 2023-07-06 DIAGNOSIS — M2012 Hallux valgus (acquired), left foot: Secondary | ICD-10-CM | POA: Diagnosis not present

## 2023-07-10 NOTE — Progress Notes (Signed)
Subjective: Chief Complaint  Patient presents with   Debridement    Trim toenails-check feet     Tamara Davidson is a 77 y.o. is seen today in office today for follow up of nail fungus and also s/p left foot first MPJ arthrodesis preformed on 12/13/2022.  She said that she is doing well and she is back to her regular shoes. She does not report any significant pain but still states  "I know when it is going to rain".  There is swelling.  She has continued with the antifungal medication (fluconazole) and starting to see some improvement. No side affects of the medication. No pain to the nails.   No open lesions.   No fevers or chills.   Objective: General: No acute distress, AAOx3  DP/PT pulses palpable 2/4, CRT < 3 sec to all digits.  Protective sensation intact. Motor function intact.  Bilateral feet: Scars are well formed.  There is no tenderness palpation.  The toe is rectus.  There is no edema no erythema or warmth.  No signs of infection. The arthrodesis sites stable. No pain on exam. No significant edema.  Nails appear to be getting lighter in color and clear along the proximal nail fold with some mild improvement.  No edema, erythema or signs of infection.  No pain in the nails.  No other open lesions or pre-ulcerative lesions.  No pain with calf compression, swelling, warmth, erythema.   Assessment and Plan:  Status post left foot first MPJ arthrodesis, doing well with no complications; onychomycosis  -Treatment options discussed including all alternatives, risks, and complications -As a courtesy, I sharply debrided the nails without any complications or bleeding. Will finish the course of fluconazole and allow the nails to grow out. Continue to monitor for any side affects.  -From a surgery standpoint, she is doing well. She is back into regular shoes, doing regular activities without any issues. Continue activity as tolerated.   -X-rays were obtained reviewed.  3 views of the  foot were obtained.  Increased consolidation of hardware intact with any complicating factors.  Hardware intact. -Continue regular shoe gear.  Gradually increase activity as tolerated.  Continue ice, elevate as well as compression help the residual edema. -She been doing well with fluconazole.  She seems improving we will continue with this.  No follow-ups on file.  [name] DPM

## 2023-07-17 ENCOUNTER — Ambulatory Visit (INDEPENDENT_AMBULATORY_CARE_PROVIDER_SITE_OTHER): Payer: Medicare HMO

## 2023-07-17 VITALS — BP 110/70 | HR 70 | Temp 97.1°F | Resp 12 | Ht 61.0 in | Wt 111.0 lb

## 2023-07-17 DIAGNOSIS — Z23 Encounter for immunization: Secondary | ICD-10-CM | POA: Diagnosis not present

## 2023-09-26 ENCOUNTER — Encounter: Payer: Self-pay | Admitting: Sports Medicine

## 2023-09-26 ENCOUNTER — Ambulatory Visit: Payer: Medicare HMO | Admitting: Sports Medicine

## 2023-09-26 VITALS — BP 120/62 | HR 87 | Temp 97.1°F | Resp 16 | Ht 61.0 in | Wt 111.8 lb

## 2023-09-26 DIAGNOSIS — I10 Essential (primary) hypertension: Secondary | ICD-10-CM

## 2023-09-26 DIAGNOSIS — R059 Cough, unspecified: Secondary | ICD-10-CM

## 2023-09-26 DIAGNOSIS — J3089 Other allergic rhinitis: Secondary | ICD-10-CM

## 2023-09-26 DIAGNOSIS — J0100 Acute maxillary sinusitis, unspecified: Secondary | ICD-10-CM | POA: Diagnosis not present

## 2023-09-26 LAB — POC COVID19 BINAXNOW: SARS Coronavirus 2 Ag: NEGATIVE

## 2023-09-26 MED ORDER — DOXYCYCLINE HYCLATE 100 MG PO TABS
100.0000 mg | ORAL_TABLET | Freq: Two times a day (BID) | ORAL | 0 refills | Status: DC
Start: 2023-09-26 — End: 2023-11-29

## 2023-09-26 MED ORDER — BENZONATATE 200 MG PO CAPS
200.0000 mg | ORAL_CAPSULE | Freq: Two times a day (BID) | ORAL | 0 refills | Status: DC | PRN
Start: 2023-09-26 — End: 2023-11-29

## 2023-09-26 NOTE — Progress Notes (Addendum)
Careteam: Patient Care Team: [name], NP as PCP - General (Adult Health Nurse Practitioner) Beverely Low, MD as Consulting Physician (Orthopedic Surgery) Mateo Flow, MD as Consulting Physician (Ophthalmology)  PLACE OF SERVICE:  [facility]  Advanced Directive information    Allergies  Allergen Reactions   Chocolate Diarrhea   Chocolate Flavoring Agent (Non-Screening)    Iodine    Oxycodone Itching   Penicillins    Povidone Iodine    Shellfish Allergy    Statins Other (See Comments)    High LFT's   Strawberry (Diagnostic) Itching   Strawberry Extract     Chief Complaint  Patient presents with   Acute Visit    Patient complains of cough.     HPI: Patient is a 77 y.o. female presented to clinic for acute visit complaining of cough  States that she lost her voice on Saturday  Followed by  productive cough since Saturday  Bringing greenish brown phlegm  Denies sob, fevers, myalgias, chills, sore throat , sinus pain ,ear pain , abdominal pain, nausea, vomiting , diarrhea Reports good appetite  Feels tired as she is  not able to sleep well at night due to cough  C/o post nasal drip  Tried mucinex dm  Denies feeling dizzy or lightheaded No h/o asthma or COPD   HTN  120/ 62  Denies feeling dizzy or  lightheaded On lisinopril, hctz    Review of Systems:  Review of Systems  Constitutional:  Positive for malaise/fatigue. Negative for chills and fever.  HENT:  Positive for congestion and sinus pain. Negative for ear pain and sore throat.   Eyes:  Negative for double vision.  Respiratory:  Positive for cough and sputum production. Negative for shortness of breath.   Cardiovascular:  Negative for chest pain, palpitations and leg swelling.  Gastrointestinal:  Negative for abdominal pain, heartburn and nausea.  Genitourinary:  Negative for dysuria, frequency and hematuria.  Musculoskeletal:  Negative for falls and myalgias.  Neurological:  Negative for  dizziness, sensory change and focal weakness.   Negative unless indicated in HPI.   Past Medical History:  Diagnosis Date   Disorder of bone and cartilage, unspecified    Disorders of bursae and tendons in shoulder region, unspecified    Diverticulitis of colon (without mention of hemorrhage)(562.11)    Essential hypertension, benign    Ganglion of tendon sheath    Lumbago    Other abnormal blood chemistry    Other and unspecified hyperlipidemia    Premenopausal menorrhagia    Sebaceous cyst    Shortness of breath    Past Surgical History:  Procedure Laterality Date   DG FOOT LEFT COMPLETE ([facility] HX) Left 12/13/2022   DG FOOT RIGHT COMPLETE ([facility] HX) Right 06/01/2022   ROTATOR CUFF REPAIR Left 10/06/2013   tendon and ligament repair   Social History:   reports that she has quit smoking. Her smoking use included cigarettes. She started smoking about 53 years ago. She has a 53.9 pack-year smoking history. She has never used smokeless tobacco. She reports that she does not drink alcohol and does not use drugs.  Family History  Problem Relation Age of Onset   Lung cancer Mother    Osteoporosis Mother    Coronary artery disease Father    Stroke Sister     Medications: Patient's Medications  New Prescriptions   No medications on file  Previous Medications   ASPIRIN 81 MG TABLET    Take 81 mg by  mouth daily.    AZELASTINE HCL (ASTEPRO) 0.15 % SOLN    Place 2 sprays into both nostrils every morning.   B COMPLEX VITAMINS (B COMPLEX PO)    Take 1 tablet by mouth daily.   CALCIUM CARBONATE (OSCAL) 1500 (600 CA) MG TABS TABLET    Take 1 tablet by mouth 2 (two) times daily with a meal.   CHOLECALCIFEROL (VITAMIN D) 400 UNITS TABS TABLET    Take 400 Units by mouth daily.    FLUCONAZOLE (DIFLUCAN) 150 MG TABLET    Take 1 tablet (150 mg total) by mouth once a week.   GLUCOSAMINE-CHONDROIT-VIT C-MN (GLUCOSAMINE CHONDR 1500 COMPLX PO)    Take by mouth daily.   GUAIFENESIN (MUCINEX) 600  MG 12 HR TABLET    Take 600 mg by mouth daily.    LEVOCETIRIZINE (XYZAL) 5 MG TABLET    Take 5 mg by mouth every evening.   LISINOPRIL-HYDROCHLOROTHIAZIDE (ZESTORETIC) 10-12.5 MG TABLET    TAKE 1 TABLET DAILY   MULTIPLE VITAMINS-MINERALS (MULTIVITAMIN WITH MINERALS) TABLET    Take 1 tablet by mouth daily.   PROBIOTIC PRODUCT (PROBIOTIC DAILY PO)    Take 1 tablet by mouth daily.    ROSUVASTATIN (CRESTOR) 5 MG TABLET    TAKE ONE-HALF (1/2) TABLET AT BEDTIME ON MONDAY, WEDNESDAY, AND FRIDAY (AVOID GRAPEFRUIT PRODUCTS)   VITAMIN C (ASCORBIC ACID) 500 MG TABLET    Take 500 mg by mouth daily.  Modified Medications   No medications on file  Discontinued Medications   No medications on file    Physical Exam: There were no vitals filed for this visit. There is no height or weight on file to calculate BMI. BP Readings from Last 3 Encounters:  07/17/23 110/70  05/24/23 110/60  11/16/22 120/78   Wt Readings from Last 3 Encounters:  07/17/23 111 lb (50.3 kg)  05/24/23 111 lb 9.6 oz (50.6 kg)  04/17/23 112 lb (50.8 kg)    Physical Exam Constitutional:      Appearance: Normal appearance.  HENT:     Head: Normocephalic and atraumatic.     Right Ear: Tympanic membrane normal.     Nose:     Comments: Bil maxillary sinus tenderness Posterior pharyngeal wall erythema No exudates    Mouth/Throat:     Pharynx: Posterior oropharyngeal erythema present. No oropharyngeal exudate.  Cardiovascular:     Rate and Rhythm: Normal rate and regular rhythm.     Heart sounds: No murmur heard. Pulmonary:     Effort: Pulmonary effort is normal. No respiratory distress.     Breath sounds: Normal breath sounds. No wheezing.  Abdominal:     General: Bowel sounds are normal. There is no distension.     Tenderness: There is no abdominal tenderness. There is no guarding or rebound.  Musculoskeletal:        General: No swelling or tenderness.  Neurological:     Mental Status: She is alert. Mental status is at  baseline.     Sensory: No sensory deficit.     Motor: No weakness.     Labs reviewed: Basic Metabolic Panel: Recent Labs    05/21/23 0932  NA 138  K 4.2  CL 100  CO2 30  GLUCOSE 96  BUN 18  CREATININE 0.79  CALCIUM 9.6   Liver Function Tests: Recent Labs    12/11/22 1102 05/21/23 0932 05/31/23 0917  AST 20 19 21   ALT 15 14 14   ALKPHOS 103  --  111  BILITOT 0.4 0.4 0.4  PROT 7.3 7.3 7.6  ALBUMIN 4.5  --  4.6   No results for input(s): "LIPASE", "AMYLASE" in the last 8760 hours. No results for input(s): "AMMONIA" in the last 8760 hours. CBC: Recent Labs    12/11/22 1102 05/21/23 0932  WBC 7.4 6.0  NEUTROABS 3.6 2,568  HGB 13.3 13.6  HCT 39.2 41.1  MCV 90 93.0  PLT 233 235   Lipid Panel: Recent Labs    11/13/22 0930 05/21/23 0932  CHOL 182 182  HDL 65 67  LDLCALC 101* 93  TRIG 70 122  CHOLHDL 2.8 2.7   TSH: No results for input(s): "TSH" in the last 8760 hours. A1C: Lab Results  Component Value Date   HGBA1C 5.9 (H) 05/21/2023     Assessment/Plan 1. Cough, unspecified type Covid test negative Lungs clear  Pt c/o productive cough Instructed to do salt water gargling - doxycycline (VIBRA-TABS) 100 MG tablet; Take 1 tablet (100 mg total) by mouth 2 (two) times daily.  Dispense: 10 tablet; Refill: 0 - benzonatate (TESSALON) 200 MG capsule; Take 1 capsule (200 mg total) by mouth 2 (two) times daily as needed for cough.  Dispense: 20 capsule; Refill: 0   . Acute non-recurrent maxillary sinusitis Maxillary sinus tenderness Will send doxycycline Instructed to use netipot - doxycycline (VIBRA-TABS) 100 MG tablet; Take 1 tablet (100 mg total) by mouth 2 (two) times daily.  Dispense: 10 tablet; Refill: 0   Non-seasonal allergic rhinitis, unspecified trigger Cont with azelastine and xyzal   HTN  Bp at goal  Cont with lisinopril, hydrochlorothiazide   No follow-ups on file.:

## 2023-10-26 ENCOUNTER — Other Ambulatory Visit: Payer: Self-pay | Admitting: Orthopedic Surgery

## 2023-10-26 DIAGNOSIS — R7303 Prediabetes: Secondary | ICD-10-CM

## 2023-10-26 DIAGNOSIS — I10 Essential (primary) hypertension: Secondary | ICD-10-CM

## 2023-11-05 ENCOUNTER — Encounter: Payer: Self-pay | Admitting: Podiatry

## 2023-11-05 ENCOUNTER — Ambulatory Visit: Payer: Medicare HMO | Admitting: Podiatry

## 2023-11-05 ENCOUNTER — Ambulatory Visit (INDEPENDENT_AMBULATORY_CARE_PROVIDER_SITE_OTHER): Payer: Medicare HMO

## 2023-11-05 DIAGNOSIS — B351 Tinea unguium: Secondary | ICD-10-CM

## 2023-11-05 DIAGNOSIS — M21622 Bunionette of left foot: Secondary | ICD-10-CM

## 2023-11-05 DIAGNOSIS — M778 Other enthesopathies, not elsewhere classified: Secondary | ICD-10-CM

## 2023-11-05 MED ORDER — TAVABOROLE 5 % EX SOLN
1.0000 [drp] | Freq: Every day | CUTANEOUS | 2 refills | Status: DC
Start: 2023-11-05 — End: 2024-05-29

## 2023-11-05 NOTE — Progress Notes (Signed)
 Subjective: Chief Complaint  Patient presents with   Nail Problem    RM#12 Nail fungus and left foot left side foot issue.    Tamara Davidson is a 78 y.o. is seen today in office today for follow up of nail fungus and also for evaluation of a prominence on the left lateral foot, tailor's bunion.  She states that she is not having significant pain on the area but there is a bunion.  No recent injury or changes otherwise.  She is finished her course of fluconazole  and has been off of this for about 4 months. No open lesions.   No fevers or chills.   Objective: General: No acute distress, AAOx3  DP/PT pulses palpable 2/4, CRT < 3 sec to all digits.  Protective sensation intact. Motor function intact.  Scar from the prior surgeries are well-healed.  She does have a tailor's bunion present in the left foot but it does not cause any pain. Nails appear to be getting lighter in color and clear along the proximal nail fold with some mild improvement.  He also generalized still mildly hypertrophic, dystrophic with subungual debris present distally.  No edema, erythema or signs of infection.  No pain in the nails.  No other open lesions or pre-ulcerative lesions.  No pain with calf compression, swelling, warmth, erythema.   Assessment and Plan:  Status post left foot first MPJ arthrodesis, doing well with no complications; onychomycosis  -Treatment options discussed including all alternatives, risks, and complications -X-rays obtained reviewed left foot.  Stable is broken but otherwise hardware intact.  There is no evidence of acute fracture. -As a courtesy, I sharply debrided the nails without any complications or bleeding.  She has finished the oral medication.  We discussed continuous versus topical.  Prescribed Kerydin  to apply topically on a daily basis. -Regards to the tailor's bunion continue offloading.  Is currently not symptomatic but continue to monitor. -She is doing well from previous  surgeries and no pain.  Discussed the broken hardware but currently asymptomatic. Continue to monitor.   Return in about 3 months (around 02/03/2024).  Tamara Davidson DPM

## 2023-11-09 ENCOUNTER — Other Ambulatory Visit: Payer: Self-pay

## 2023-11-09 MED ORDER — ROSUVASTATIN CALCIUM 5 MG PO TABS: ORAL_TABLET | ORAL | 3 refills | Status: DC

## 2023-11-09 NOTE — Telephone Encounter (Signed)
 Patient has request refill on medication that has Allergy Contraindication. Medication pend and sent to PCP [name], NP

## 2023-11-20 ENCOUNTER — Telehealth: Payer: Self-pay

## 2023-11-20 NOTE — Telephone Encounter (Signed)
Pt returned call and she did purchase the jublia.

## 2023-11-20 NOTE — Telephone Encounter (Signed)
Called patient and left a message. Asked if she woud please callback and let us know if she ever purchased the [place] or not. thanks

## 2023-11-26 ENCOUNTER — Other Ambulatory Visit: Payer: Medicare HMO

## 2023-11-26 DIAGNOSIS — R7303 Prediabetes: Secondary | ICD-10-CM

## 2023-11-26 DIAGNOSIS — I10 Essential (primary) hypertension: Secondary | ICD-10-CM

## 2023-11-27 ENCOUNTER — Encounter: Payer: Self-pay | Admitting: Orthopedic Surgery

## 2023-11-27 LAB — CBC WITH DIFFERENTIAL/PLATELET
Absolute Lymphocytes: 3274 {cells}/uL (ref 850–3900)
Absolute Monocytes: 440 {cells}/uL (ref 200–950)
Basophils Absolute: 37 {cells}/uL (ref 0–200)
Basophils Relative: 0.6 %
Eosinophils Absolute: 93 {cells}/uL (ref 15–500)
Eosinophils Relative: 1.5 %
HCT: 40.2 % (ref 35.0–45.0)
Hemoglobin: 13.4 g/dL (ref 11.7–15.5)
MCH: 30.3 pg (ref 27.0–33.0)
MCHC: 33.3 g/dL (ref 32.0–36.0)
MCV: 91 fL (ref 80.0–100.0)
MPV: 10 fL (ref 7.5–12.5)
Monocytes Relative: 7.1 %
Neutro Abs: 2356 {cells}/uL (ref 1500–7800)
Neutrophils Relative %: 38 %
Platelets: 210 10*3/uL (ref 140–400)
RBC: 4.42 10*6/uL (ref 3.80–5.10)
RDW: 13.1 % (ref 11.0–15.0)
Total Lymphocyte: 52.8 %
WBC: 6.2 10*3/uL (ref 3.8–10.8)

## 2023-11-27 LAB — COMPLETE METABOLIC PANEL WITH GFR
AG Ratio: 1.2 (calc) (ref 1.0–2.5)
ALT: 20 U/L (ref 6–29)
AST: 22 U/L (ref 10–35)
Albumin: 4.1 g/dL (ref 3.6–5.1)
Alkaline phosphatase (APISO): 89 U/L (ref 37–153)
BUN: 22 mg/dL (ref 7–25)
CO2: 29 mmol/L (ref 20–32)
Calcium: 9.6 mg/dL (ref 8.6–10.4)
Chloride: 99 mmol/L (ref 98–110)
Creat: 0.76 mg/dL (ref 0.60–1.00)
Globulin: 3.4 g/dL (ref 1.9–3.7)
Glucose, Bld: 97 mg/dL (ref 65–99)
Potassium: 3.9 mmol/L (ref 3.5–5.3)
Sodium: 137 mmol/L (ref 135–146)
Total Bilirubin: 0.5 mg/dL (ref 0.2–1.2)
Total Protein: 7.5 g/dL (ref 6.1–8.1)
eGFR: 80 mL/min/{1.73_m2} (ref >=60)

## 2023-11-27 LAB — HEMOGLOBIN A1C
Hgb A1c MFr Bld: 6.1 %{Hb} — ABNORMAL HIGH (ref <5.7)
Mean Plasma Glucose: 128 mg/dL
eAG (mmol/L): 7.1 mmol/L

## 2023-11-28 ENCOUNTER — Other Ambulatory Visit: Payer: Self-pay | Admitting: Orthopedic Surgery

## 2023-11-28 DIAGNOSIS — E7800 Pure hypercholesterolemia, unspecified: Secondary | ICD-10-CM

## 2023-11-28 LAB — HM MAMMOGRAPHY

## 2023-11-28 MED ORDER — ROSUVASTATIN CALCIUM 5 MG PO TABS: ORAL_TABLET | ORAL | 3 refills | Status: DC

## 2023-11-29 ENCOUNTER — Ambulatory Visit (INDEPENDENT_AMBULATORY_CARE_PROVIDER_SITE_OTHER): Payer: Medicare HMO | Admitting: Orthopedic Surgery

## 2023-11-29 ENCOUNTER — Encounter: Payer: Self-pay | Admitting: Orthopedic Surgery

## 2023-11-29 VITALS — BP 118/60 | HR 95 | Temp 97.3°F | Resp 18 | Ht 61.0 in | Wt 115.8 lb

## 2023-11-29 DIAGNOSIS — I10 Essential (primary) hypertension: Secondary | ICD-10-CM | POA: Diagnosis not present

## 2023-11-29 DIAGNOSIS — R7303 Prediabetes: Secondary | ICD-10-CM | POA: Diagnosis not present

## 2023-11-29 DIAGNOSIS — Z780 Asymptomatic menopausal state: Secondary | ICD-10-CM | POA: Diagnosis not present

## 2023-11-29 DIAGNOSIS — E7800 Pure hypercholesterolemia, unspecified: Secondary | ICD-10-CM

## 2023-11-29 DIAGNOSIS — R0982 Postnasal drip: Secondary | ICD-10-CM

## 2023-11-29 NOTE — Patient Instructions (Signed)
Fasting labs next lab visit

## 2023-11-29 NOTE — Progress Notes (Signed)
Careteam: Patient Care Team: [name], NP as PCP - General (Adult Health Nurse Practitioner) Beverely Low, MD as Consulting Physician (Orthopedic Surgery) Mateo Flow, MD as Consulting Physician (Ophthalmology)  Seen by: Hazle Nordmann, AGNP-C  PLACE OF SERVICE:  [facility]  Advanced Directive information Does Patient Have a Medical Advance Directive?: Yes, Type of Advance Directive: Healthcare Power of [place];Living will, Does patient want to make changes to medical advance directive?: No - Patient declined  Allergies  Allergen Reactions   Chocolate Diarrhea   Chocolate Flavoring Agent (Non-Screening)    Iodine    Oxycodone Itching   Penicillins    Povidone Iodine    Shellfish Allergy    Statins Other (See Comments)    High LFT's   Strawberry (Diagnostic) Itching   Strawberry Extract     Chief Complaint  Patient presents with   Medical Management of Chronic Issues    6 month follow up.    Immunizations    Discuss the need for [employer].     HPI: Patient is a 78 y.o. female seen today for medical management of chronic conditions.   Discussed the use of AI scribe software for clinical note transcription with the patient, who gave verbal consent to proceed.  History of Present Illness   The patient is a 78 year old female who presents for a routine follow-up visit.  Recent labs discussed with patient.   Her recent lab work showed a stable CBC with differential, normal kidney and liver function tests, and an A1c of 6.1, which is slightly elevated from previous readings of 5.9 a year ago and 6.1 three months ago. She attributes the increase to holiday foods and has not been to the gym for the past eight weeks due to her partner's retina surgery. She has resumed her exercise routine recently. She has a history of prediabetes, managed with diet control, and is currently not on any medication for diabetes. Her weight has increased by four pounds, which she  attributes to decreased physical activity during the winter months.  She is on lisinopril hydrochlorothiazide for high blood pressure and takes Crestor for high cholesterol. She also takes a baby aspirin for heart health.  She has a history of osteoporosis and takes calcium carbonate and vitamin D twice daily. Her last bone density test in 2023 was normal, and she is due for a follow-up in 2028. She is the only one in her family with normal bone density, which she attributes to her consistent exercise routine.  She reports a recent sinus infection diagnosed in November 2024, which was her first in over twenty years. She continues to experience postnasal drip and nighttime coughing. She is currently taking Xyzal and azelastine nasal spray for these symptoms.       Review of Systems:  Review of Systems  Constitutional: Negative.   HENT: Negative.    Eyes: Negative.   Respiratory: Negative.    Cardiovascular: Negative.   Gastrointestinal: Negative.   Genitourinary: Negative.   Musculoskeletal: Negative.   Skin: Negative.   Neurological: Negative.   Psychiatric/Behavioral: Negative.      Past Medical History:  Diagnosis Date   Disorder of bone and cartilage, unspecified    Disorders of bursae and tendons in shoulder region, unspecified    Diverticulitis of colon (without mention of hemorrhage)(562.11)    Essential hypertension, benign    Ganglion of tendon sheath    Lumbago    Other abnormal blood chemistry  Other and unspecified hyperlipidemia    Premenopausal menorrhagia    Sebaceous cyst    Shortness of breath    Past Surgical History:  Procedure Laterality Date   DG FOOT LEFT COMPLETE ([facility] HX) Left 12/13/2022   DG FOOT RIGHT COMPLETE ([facility] HX) Right 06/01/2022   ROTATOR CUFF REPAIR Left 10/06/2013   tendon and ligament repair   Social History:   reports that she has quit smoking. Her smoking use included cigarettes. She started smoking about 54 years ago. She has a  54.1 pack-year smoking history. She has never used smokeless tobacco. She reports that she does not drink alcohol and does not use drugs.  Family History  Problem Relation Age of Onset   Lung cancer Mother    Osteoporosis Mother    Coronary artery disease Father    Stroke Sister     Medications: Patient's Medications  New Prescriptions   No medications on file  Previous Medications   ASPIRIN 81 MG TABLET    Take 81 mg by mouth daily.    AZELASTINE HCL (ASTEPRO) 0.15 % SOLN    Place 2 sprays into both nostrils every morning.   B COMPLEX VITAMINS (B COMPLEX PO)    Take 1 tablet by mouth daily.   CALCIUM CARBONATE (OSCAL) 1500 (600 CA) MG TABS TABLET    Take 1 tablet by mouth 2 (two) times daily with a meal.   CHOLECALCIFEROL (VITAMIN D) 400 UNITS TABS TABLET    Take 400 Units by mouth daily.    GLUCOSAMINE-CHONDROIT-VIT C-MN (GLUCOSAMINE CHONDR 1500 COMPLX PO)    Take by mouth daily.   GUAIFENESIN (MUCINEX) 600 MG 12 HR TABLET    Take 600 mg by mouth daily.    LEVOCETIRIZINE (XYZAL) 5 MG TABLET    Take 5 mg by mouth every evening.   LISINOPRIL-HYDROCHLOROTHIAZIDE (ZESTORETIC) 10-12.5 MG TABLET    TAKE 1 TABLET DAILY   MULTIPLE VITAMINS-MINERALS (MULTIVITAMIN WITH MINERALS) TABLET    Take 1 tablet by mouth daily.   PROBIOTIC PRODUCT (PROBIOTIC DAILY PO)    Take 1 tablet by mouth daily.    ROSUVASTATIN (CRESTOR) 5 MG TABLET    TAKE ONE-HALF (1/2) TABLET AT BEDTIME ON MONDAY, WEDNESDAY, AND FRIDAY (AVOID GRAPEFRUIT PRODUCTS)   TAVABOROLE (KERYDIN) 5 % SOLN    Apply 1 drop topically daily. Apply 1 drop to the toenail daily.   VITAMIN C (ASCORBIC ACID) 500 MG TABLET    Take 500 mg by mouth daily.  Modified Medications   No medications on file  Discontinued Medications   BENZONATATE (TESSALON) 200 MG CAPSULE    Take 1 capsule (200 mg total) by mouth 2 (two) times daily as needed for cough.   DOXYCYCLINE (VIBRA-TABS) 100 MG TABLET    Take 1 tablet (100 mg total) by mouth 2 (two) times daily.    FLUCONAZOLE (DIFLUCAN) 150 MG TABLET    Take 1 tablet (150 mg total) by mouth once a week.    Physical Exam:  Vitals:   11/29/23 1051  BP: 118/60  Pulse: 95  Resp: 18  Temp: (!) 97.3 F (36.3 C)  SpO2: 99%  Weight: 115 lb 12.8 oz (52.5 kg)  Height: 5\' 1"  (1.549 m)   Body mass index is 21.88 kg/m. Wt Readings from Last 3 Encounters:  11/29/23 115 lb 12.8 oz (52.5 kg)  09/26/23 111 lb 12.8 oz (50.7 kg)  07/17/23 111 lb (50.3 kg)    Physical Exam Vitals reviewed.  Constitutional:  General: She is not in acute distress. HENT:     Head: Normocephalic.  Eyes:     General:        Right eye: No discharge.        Left eye: No discharge.  Cardiovascular:     Pulses: Normal pulses.     Heart sounds: Normal heart sounds.  Pulmonary:     Effort: Pulmonary effort is normal.     Breath sounds: Normal breath sounds.  Abdominal:     Palpations: Abdomen is soft.  Musculoskeletal:        General: Normal range of motion.  Skin:    Capillary Refill: Capillary refill takes less than 2 seconds.  Neurological:     General: No focal deficit present.     Mental Status: She is alert and oriented to person, place, and time.  Psychiatric:        Mood and Affect: Mood normal.     Labs reviewed: Basic Metabolic Panel: Recent Labs    05/21/23 0932 11/26/23 0914  NA 138 137  K 4.2 3.9  CL 100 99  CO2 30 29  GLUCOSE 96 97  BUN 18 22  CREATININE 0.79 0.76  CALCIUM 9.6 9.6   Liver Function Tests: Recent Labs    12/11/22 1102 05/21/23 0932 05/31/23 0917 11/26/23 0914  AST 20 19 21 22   ALT 15 14 14 20   ALKPHOS 103  --  111  --   BILITOT 0.4 0.4 0.4 0.5  PROT 7.3 7.3 7.6 7.5  ALBUMIN 4.5  --  4.6  --    No results for input(s): "LIPASE", "AMYLASE" in the last 8760 hours. No results for input(s): "AMMONIA" in the last 8760 hours. CBC: Recent Labs    12/11/22 1102 05/21/23 0932 11/26/23 0914  WBC 7.4 6.0 6.2  NEUTROABS 3.6 2,568 2,356  HGB 13.3 13.6 13.4   HCT 39.2 41.1 40.2  MCV 90 93.0 91.0  PLT 233 235 210   Lipid Panel: Recent Labs    05/21/23 0932  CHOL 182  HDL 67  LDLCALC 93  TRIG 122  CHOLHDL 2.7   TSH: No results for input(s): "TSH" in the last 8760 hours. A1C: Lab Results  Component Value Date   HGBA1C 6.1 (H) 11/26/2023     Assessment/Plan 1. Prediabetes (Primary) - A1c 6.1 - diet controlled - cont asa, lisinopril-hydrochlorothiazide and statin  2. Essential hypertension, benign - controlled with lisinopril-HCTZ  3. Pure hypercholesterolemia - total 182, LDL 93 - cont statin  - recommend goal LDL around 70 due to prediabetes - recheck lipid panel next encounter  4. Postmenopausal - DEXA 2023> normal> plan to repeat 2028 - cont exercise and calcium/vit D supplement  5. Post-nasal drip - ongoing since cold 08/2023 - cont azelastine nasal spray - advised to switch to zyrtec x 14 days, currently on Xyzal  Total time: 30 minutes. Greater than 50% of total time spent doing patient education regarding health maintenance, prediabetes, HLD, HTN, bone health and post nasal drip including symptom/medication management.   Next appt: 05/01/2024  Hazle Nordmann, Juel Burrow  [facility] [phone]

## 2023-11-30 ENCOUNTER — Encounter: Payer: Self-pay | Admitting: Orthopedic Surgery

## 2024-01-15 LAB — HM COLONOSCOPY

## 2024-02-04 ENCOUNTER — Encounter: Payer: Self-pay | Admitting: Podiatry

## 2024-02-04 ENCOUNTER — Ambulatory Visit: Payer: Medicare HMO | Admitting: Podiatry

## 2024-02-04 ENCOUNTER — Ambulatory Visit (INDEPENDENT_AMBULATORY_CARE_PROVIDER_SITE_OTHER)

## 2024-02-04 DIAGNOSIS — M21619 Bunion of unspecified foot: Secondary | ICD-10-CM

## 2024-02-04 DIAGNOSIS — B351 Tinea unguium: Secondary | ICD-10-CM | POA: Diagnosis not present

## 2024-02-04 DIAGNOSIS — Z79899 Other long term (current) drug therapy: Secondary | ICD-10-CM

## 2024-02-04 DIAGNOSIS — M79672 Pain in left foot: Secondary | ICD-10-CM

## 2024-02-04 MED ORDER — FLUCONAZOLE 150 MG PO TABS: 150.0000 mg | ORAL_TABLET | ORAL | 0 refills | Status: DC

## 2024-02-06 NOTE — Progress Notes (Signed)
 Subjective: Chief Complaint  Patient presents with   RFC    RM#11 nail fungus, trim  has a few concerns about left foot previous surgery and her nail fungus.     Tamara Davidson is a 78 y.o. is seen today in office today for follow up of nail fungus and also want to have her left foot checked that she is concerned the left big toe may be turning but is not causing any pain.  No recent injury or changes since I saw her last.  She does feel that the oral medication was doing much better for her than the topical medication.  Objective: General: No acute distress, AAOx3  DP/PT pulses palpable 2/4, CRT < 3 sec to all digits.  Protective sensation intact. Motor function intact.  Scar from the prior surgeries are well-healed.  She does have a tailor's bunion present in the left foot but it does not cause any pain.  Left hallux is turned slightly but is not causing any pain.  There is no edema, erythema. Nails appear to be getting lighter in color compared to what they were but it is not changed significantly since I saw her last.  There is no pain today also there is no swelling or redness or any drainage.  No open lesions.  No pain with calf compression, swelling, warmth, erythema.   Assessment and Plan:  Status post left foot first MPJ arthrodesis, onychomycosis  Specimen MPJ arthrodesis -X-rays obtained reviewed left foot.  Stable is broken but otherwise hardware intact.  There is no evidence of acute fracture.  Does not peer to be significantly changed position mild compared to prior x-ray.   -Continue shoes, good arch support and offloading.  Currently not having any pain we will continue to monitor.  Onychomycosis -This occurred secondary to the nails and complications of bleeding.  She been off of fluconazole that she feels that she did much better with that.  Patient had blood work and I will order this for her.  Will recheck blood work in about 6 weeks and order provided.  Return in  about 3 months (around 05/05/2024).  [name] DPM

## 2024-03-07 ENCOUNTER — Other Ambulatory Visit: Payer: Self-pay | Admitting: *Deleted

## 2024-03-07 MED ORDER — LISINOPRIL-HYDROCHLOROTHIAZIDE 10-12.5 MG PO TABS: 1.0000 | ORAL_TABLET | Freq: Every day | ORAL | 1 refills | Status: DC

## 2024-03-07 NOTE — Telephone Encounter (Signed)
 Pharmacy requested refill

## 2024-03-18 LAB — HEPATIC FUNCTION PANEL
AG Ratio: 1.4 (calc) (ref 1.0–2.5)
ALT: 14 U/L (ref 6–29)
AST: 19 U/L (ref 10–35)
Albumin: 4.5 g/dL (ref 3.6–5.1)
Alkaline phosphatase (APISO): 91 U/L (ref 37–153)
Bilirubin, Direct: 0.1 mg/dL (ref 0.0–0.2)
Globulin: 3.2 g/dL (ref 1.9–3.7)
Indirect Bilirubin: 0.4 mg/dL (ref 0.2–1.2)
Total Bilirubin: 0.5 mg/dL (ref 0.2–1.2)
Total Protein: 7.7 g/dL (ref 6.1–8.1)

## 2024-03-18 LAB — CBC WITH DIFFERENTIAL/PLATELET
Absolute Lymphocytes: 3337 {cells}/uL (ref 850–3900)
Absolute Monocytes: 483 {cells}/uL (ref 200–950)
Basophils Absolute: 28 {cells}/uL (ref 0–200)
Basophils Relative: 0.4 %
Eosinophils Absolute: 142 {cells}/uL (ref 15–500)
Eosinophils Relative: 2 %
HCT: 41.6 % (ref 35.0–45.0)
Hemoglobin: 13.6 g/dL (ref 11.7–15.5)
MCH: 30.1 pg (ref 27.0–33.0)
MCHC: 32.7 g/dL (ref 32.0–36.0)
MCV: 92 fL (ref 80.0–100.0)
MPV: 9.2 fL (ref 7.5–12.5)
Monocytes Relative: 6.8 %
Neutro Abs: 3110 {cells}/uL (ref 1500–7800)
Neutrophils Relative %: 43.8 %
Platelets: 244 10*3/uL (ref 140–400)
RBC: 4.52 10*6/uL (ref 3.80–5.10)
RDW: 12.8 % (ref 11.0–15.0)
Total Lymphocyte: 47 %
WBC: 7.1 10*3/uL (ref 3.8–10.8)

## 2024-03-19 ENCOUNTER — Ambulatory Visit: Payer: Self-pay | Admitting: Podiatry

## 2024-04-18 ENCOUNTER — Encounter: Payer: Medicare HMO | Admitting: Adult Health

## 2024-04-24 ENCOUNTER — Encounter: Payer: Medicare HMO | Admitting: Orthopedic Surgery

## 2024-05-01 ENCOUNTER — Encounter: Payer: Self-pay | Admitting: Orthopedic Surgery

## 2024-05-01 ENCOUNTER — Ambulatory Visit: Payer: Medicare HMO | Admitting: Orthopedic Surgery

## 2024-05-01 DIAGNOSIS — Z0000 Encounter for general adult medical examination without abnormal findings: Secondary | ICD-10-CM | POA: Diagnosis not present

## 2024-05-01 NOTE — Progress Notes (Signed)
 This service is provided via telemedicine  No vital signs collected/recorded due to the encounter was a telemedicine visit.   Location of patient (ex: home, work):  home  Patient consents to a telephone visit: yes  Location of the provider (ex: office, home):  [facility]   Name of any referring provider:  N/A  Names of all persons participating in the telemedicine service and their role in the encounter:  Evertt Pereyra CMA, Gil, Amy NP and Patient.  Time spent on call: 11 mins

## 2024-05-01 NOTE — Patient Instructions (Addendum)
  Ms. Carmer , Thank you for taking time to come for your Medicare Wellness Visit. I appreciate your ongoing commitment to your health goals. Please review the following plan we discussed and let me know if I can assist you in the future.   These are the goals we discussed:  Goals      Activity and Exercise Increased     Evidence-based guidance:  Review current exercise levels.  Assess patient perspective on exercise or activity level, barriers to increasing activity, motivation and readiness for change.  Recommend or set healthy exercise goal based on individual tolerance.  Encourage small steps toward making change in amount of exercise or activity.  Urge reduction of sedentary activities or screen time.  Promote group activities within the community or with family or support person.  Consider referral to rehabiliation therapist for assessment and exercise/activity plan.   Notes:      Lose Belly Fat     Starting today pt will increase gym days to 4 days a week.      Weight (lb) < 200 lb (90.7 kg)     Loss weight  firm up abdomen         This is a list of the screening recommended for you and due dates:  Health Maintenance  Topic Date Due   COVID-19 Vaccine (8 - 2024-25 season) 10/29/2024*   Flu Shot  05/30/2024   Medicare Annual Wellness Visit  05/01/2025   DTaP/Tdap/Td vaccine (4 - Td or Tdap) 11/08/2027   Pneumococcal Vaccine for age over 61  Completed   DEXA scan (bone density measurement)  Completed   Hepatitis C Screening  Completed   Zoster (Shingles) Vaccine  Completed   Hepatitis B Vaccine  Aged Out   HPV Vaccine  Aged Out   Meningitis B Vaccine  Aged Out   Screening for Lung Cancer  Discontinued   Colon Cancer Screening  Discontinued  *Topic was postponed. The date shown is not the original due date.   UTD

## 2024-05-01 NOTE — Progress Notes (Signed)
 Subjective:   Tamara Davidson is a 78 y.o. female who presents for Medicare Annual (Subsequent) preventive examination.  Visit Complete: In person  Patient Medicare AWV questionnaire was completed by the patient on 05/01/2024; I have confirmed that all information answered by patient is correct and no changes since this date.  Cardiac Risk Factors include: advanced age (>30men, >69 women);hypertension;smoking/ tobacco exposure     Objective:    There were no vitals filed for this visit. There is no height or weight on file to calculate BMI.     11/29/2023   10:51 AM 09/26/2023    8:52 AM 05/24/2023   10:23 AM 04/17/2023   10:10 AM 05/11/2022    8:51 AM 04/21/2022   10:44 AM 04/12/2021    1:16 PM  Advanced Directives  Does Patient Have a Medical Advance Directive? Yes Yes Yes Yes Yes Yes Yes  Type of Estate agent of [place];Living will Healthcare Power of [place];Living will Healthcare Power of [place];Out of facility DNR (pink MOST or yellow form) Out of facility DNR (pink MOST or yellow form);Healthcare Power of [employer] of [place];Living will Healthcare Power of [place];Living will Healthcare Power of [place];Living will  Does patient want to make changes to medical advance directive? No - Patient declined No - Patient declined No - Patient declined No - Patient declined No - Patient declined No - Patient declined No - Patient declined  Copy of Healthcare Power of Attorney in Chart? No - copy requested Yes - validated most recent copy scanned in chart (See row information) Yes - validated most recent copy scanned in chart (See row information) Yes - validated most recent copy scanned in chart (See row information) Yes - validated most recent copy scanned in chart (See row information) Yes - validated most recent copy scanned in chart (See row information) Yes - validated most recent copy scanned in chart (See row information)    Current  Medications (verified) Outpatient Encounter Medications as of 05/01/2024  Medication Sig   aspirin 81 MG tablet Take 81 mg by mouth daily.    Azelastine HCl (ASTEPRO) 0.15 % SOLN Place 2 sprays into both nostrils every morning.   B Complex Vitamins (B COMPLEX PO) Take 1 tablet by mouth daily.   calcium  carbonate (OSCAL) 1500 (600 Ca) MG TABS tablet Take 1 tablet by mouth 2 (two) times daily with a meal.   cholecalciferol (VITAMIN D ) 400 UNITS TABS tablet Take 400 Units by mouth daily.    fluconazole  (DIFLUCAN ) 150 MG tablet Take 1 tablet (150 mg total) by mouth once a week.   Glucosamine-Chondroit-Vit C-Mn (GLUCOSAMINE CHONDR 1500 COMPLX PO) Take by mouth daily.   guaiFENesin (MUCINEX) 600 MG 12 hr tablet Take 600 mg by mouth daily.    levocetirizine (XYZAL) 5 MG tablet Take 5 mg by mouth every evening.   lisinopril -hydrochlorothiazide  (ZESTORETIC ) 10-12.5 MG tablet Take 1 tablet by mouth daily.   Multiple Vitamins-Minerals (MULTIVITAMIN WITH MINERALS) tablet Take 1 tablet by mouth daily.   Probiotic Product (PROBIOTIC DAILY PO) Take 1 tablet by mouth daily.    rosuvastatin  (CRESTOR ) 5 MG tablet TAKE ONE-HALF (1/2) TABLET AT BEDTIME ON MONDAY, WEDNESDAY, AND FRIDAY (AVOID GRAPEFRUIT PRODUCTS)   Tavaborole  (KERYDIN ) 5 % SOLN Apply 1 drop topically daily. Apply 1 drop to the toenail daily.   vitamin C (ASCORBIC ACID) 500 MG tablet Take 500 mg by mouth daily.   No facility-administered encounter medications on file as of 05/01/2024.    Allergies (  verified) Chocolate, Chocolate flavoring agent (non-screening), Iodine, Oxycodone , Penicillins, Povidone iodine, Shellfish allergy, Statins, Strawberry (diagnostic), and Strawberry extract   History: Past Medical History:  Diagnosis Date   Disorder of bone and cartilage, unspecified    Disorders of bursae and tendons in shoulder region, unspecified    Diverticulitis of colon (without mention of hemorrhage)(562.11)    Essential hypertension, benign     Ganglion of tendon sheath    Lumbago    Other abnormal blood chemistry    Other and unspecified hyperlipidemia    Premenopausal menorrhagia    Sebaceous cyst    Shortness of breath    Past Surgical History:  Procedure Laterality Date   DG FOOT LEFT COMPLETE ([facility] HX) Left 12/13/2022   DG FOOT RIGHT COMPLETE ([facility] HX) Right 06/01/2022   ROTATOR CUFF REPAIR Left 10/06/2013   tendon and ligament repair   Family History  Problem Relation Age of Onset   Lung cancer Mother    Osteoporosis Mother    Coronary artery disease Father    Stroke Sister    Social History   Socioeconomic History   Marital status: Married    Spouse name: Not on file   Number of children: Not on file   Years of education: Not on file   Highest education level: Bachelor's degree (e.g., BA, AB, BS)  Occupational History   Not on file  Tobacco Use   Smoking status: Former    Current packs/day: 1.00    Average packs/day: 1 pack/day for 54.5 years (54.5 ttl pk-yrs)    Types: Cigarettes    Start date: 10/30/1969   Smokeless tobacco: Never  Vaping Use   Vaping status: Never Used  Substance and Sexual Activity   Alcohol use: No    Alcohol/week: 0.0 standard drinks of alcohol   Drug use: No   Sexual activity: Not on file  Other Topics Concern   Not on file  Social History Narrative   Married 23 years, with Fairy for 29 years   Social Drivers of Corporate investment banker Strain: Low Risk  (05/01/2024)   Overall Financial Resource Strain (CARDIA)    Difficulty of Paying Living Expenses: Not hard at all  Food Insecurity: No Food Insecurity (05/01/2024)   Hunger Vital Sign    Worried About Running Out of Food in the Last Year: Never true    Ran Out of Food in the Last Year: Never true  Transportation Needs: No Transportation Needs (05/01/2024)   PRAPARE - Administrator, Civil Service (Medical): No    Lack of Transportation (Non-Medical): No  Physical Activity: Sufficiently Active  (05/01/2024)   Exercise Vital Sign    Days of Exercise per Week: 4 days    Minutes of Exercise per Session: 150+ min  Stress: No Stress Concern Present (05/01/2024)   [employer] of Occupational Health - Occupational Stress Questionnaire    Feeling of Stress: Not at all  Social Connections: Unknown (05/01/2024)   Social Connection and Isolation Panel    Frequency of Communication with Friends and Family: More than three times a week    Frequency of Social Gatherings with Friends and Family: More than three times a week    Attends Religious Services: More than 4 times per year    Active Member of [employer] or Organizations: Not on file    Attends Banker Meetings: More than 4 times per year    Marital Status: Not on file    Tobacco  Counseling Counseling given: Not Answered   Clinical Intake:  Pre-visit preparation completed: Yes  Pain : No/denies pain     BMI - recorded: 22.1 Nutritional Status: BMI of 19-24  Normal Nutritional Risks: None Diabetes: No  How often do you need to have someone help you when you read instructions, pamphlets, or other written materials from your doctor or pharmacy?: 1 - Never What is the last grade level you completed in school?: [employer]' degree  Interpreter Needed?: No      Activities of Daily Living    05/01/2024    1:02 PM 04/28/2024   12:23 PM  In your present state of health, do you have any difficulty performing the following activities:  Hearing? 0 0  Vision? 0 0  Difficulty concentrating or making decisions? 0 0  Walking or climbing stairs? 0 0  Dressing or bathing? 0 0  Doing errands, shopping? 0 0  Preparing Food and eating ? N N  Using the Toilet? N N  In the past six months, have you accidently leaked urine? N N  Do you have problems with loss of bowel control? N N  Managing your Medications? N N  Managing your Finances? N N  Housekeeping or managing your Housekeeping?  N    Patient Care Team: Gil Greig BRAVO, NP as PCP - General (Adult Health Nurse Practitioner) Kay Kemps, MD as Consulting Physician (Orthopedic Surgery) Cleatus Collar, MD as Consulting Physician (Ophthalmology)  Indicate any recent Medical Services you may have received from other than [facility] providers in the past year (date may be approximate).     Assessment:   This is a routine wellness examination for Tamara Davidson.  Hearing/Vision screen Hearing Screening - Comments:: Patient Stated no problems. Vision Screening - Comments:: Pt stated no problems with vision    Goals Addressed             This Visit's Progress    Activity and Exercise Increased   On track    Evidence-based guidance:  Review current exercise levels.  Assess patient perspective on exercise or activity level, barriers to increasing activity, motivation and readiness for change.  Recommend or set healthy exercise goal based on individual tolerance.  Encourage small steps toward making change in amount of exercise or activity.  Urge reduction of sedentary activities or screen time.  Promote group activities within the community or with family or support person.  Consider referral to rehabiliation therapist for assessment and exercise/activity plan.   Notes:        Depression Screen    05/01/2024    1:04 PM 05/01/2024   12:37 PM 11/29/2023    1:27 PM 04/17/2023   10:12 AM 11/16/2022   10:33 AM 04/21/2022   10:41 AM 04/12/2021    1:13 PM  PHQ 2/9 Scores  PHQ - 2 Score 0 0 0 0 0 0 0  Exception Documentation    Other- indicate reason in comment box     Not completed    Annual wellness visit       Fall Risk    05/01/2024    1:04 PM 04/28/2024   12:23 PM 11/29/2023   10:51 AM 09/26/2023    8:52 AM 05/24/2023   10:22 AM  Fall Risk   Falls in the past year? 0 0 0 0 0  Number falls in past yr: 0  0 0 0  Injury with Fall? 0  0 0 0  Risk for fall due to : Impaired balance/gait;No  Fall Risks  No Fall Risks No Fall Risks No Fall Risks  Follow up Falls  evaluation completed  Falls evaluation completed;Education provided;Falls prevention discussed Falls evaluation completed;Education provided;Falls prevention discussed Falls evaluation completed;Education provided;Falls prevention discussed    MEDICARE RISK AT HOME: Medicare Risk at Home Any stairs in or around the home?: No If so, are there any without handrails?: No Home free of loose throw rugs in walkways, pet beds, electrical cords, etc?: Yes Adequate lighting in your home to reduce risk of falls?: Yes Life alert?: No Use of a cane, walker or w/c?: No Grab bars in the bathroom?: Yes Shower chair or bench in shower?: No Elevated toilet seat or a handicapped toilet?: No  TIMED UP AND GO:  Was the test performed?  No    Cognitive Function:    04/09/2019    9:04 AM 04/08/2018    9:07 AM 03/21/2017    1:16 PM 02/10/2016    1:52 PM 07/13/2014    1:51 PM  MMSE - Mini Mental State Exam  Orientation to time 5 5 5  5  5    Orientation to Place 5 5 5  5  5    Registration 3 3 3  3  3    Attention/ Calculation 5 5 5  5  5    Recall 3 3 3  3  2    Language- name 2 objects 2 2 2  2  2    Language- repeat 1 1 1 1 1   Language- follow 3 step command 3 3 3  3  3    Language- read & follow direction 1 1 1  1  1    Write a sentence 1 1 1  1  1    Copy design 1 1 1  1  1    Total score 30 30 30  30  29       Data saved with a previous flowsheet row definition        05/01/2024   12:37 PM 04/17/2023   11:40 AM 04/21/2022   10:42 AM 04/12/2021    1:14 PM 04/09/2020   10:30 AM  6CIT Screen  What Year? 0 points 0 points 0 points 0 points 0 points  What month? 0 points 0 points 0 points 0 points 0 points  What time? 0 points 0 points 0 points 0 points 0 points  Count back from 20 0 points 0 points 0 points 0 points 0 points  Months in reverse 0 points 0 points 0 points 0 points 0 points  Repeat phrase 2 points 0 points 2 points 0 points 2 points  Total Score 2 points 0 points 2 points 0 points 2  points    Immunizations Immunization History  Administered Date(s) Administered   Fluad Quad(high Dose 65+) 07/21/2019, 07/20/2020, 08/30/2021, 08/29/2022   Fluad Trivalent(High Dose 65+) 07/17/2023   Influenza Whole 07/28/2010, 07/11/2012   Influenza, High Dose Seasonal PF 07/12/2017, 07/22/2018   Influenza,inj,Quad PF,6+ Mos 07/13/2014, 08/12/2015, 06/15/2016   Influenza-Unspecified 07/30/2013   PFIZER Comirnaty(Gray Top)Covid-19 Tri-Sucrose Vaccine 08/31/2022, 09/05/2022, 07/20/2023   PFIZER(Purple Top)SARS-COV-2 Vaccination 11/21/2019, 12/12/2019, 06/30/2020, 02/14/2021, 10/03/2021   Pneumococcal Conjugate-13 10/08/2014   Pneumococcal Polysaccharide-23 10/30/2010, 12/06/2010   Tdap 10/15/2007, 10/30/2010, 11/07/2017   Zoster Recombinant(Shingrix) 09/26/2017, 11/30/2017   Zoster, Live 01/25/2013    TDAP status: Up to date  Flu Vaccine status: Up to date  Pneumococcal vaccine status: Up to date  Covid-19 vaccine status: Completed vaccines  Qualifies for Shingles Vaccine? Yes   Zostavax completed  Yes   Shingrix Completed?: Yes  Screening Tests Health Maintenance  Topic Date Due   COVID-19 Vaccine (8 - 2024-25 season) 10/29/2024 (Originally 09/14/2023)   INFLUENZA VACCINE  05/30/2024   Medicare Annual Wellness (AWV)  05/01/2025   DTaP/Tdap/Td (4 - Td or Tdap) 11/08/2027   Pneumococcal Vaccine: 50+ Years  Completed   DEXA SCAN  Completed   Hepatitis C Screening  Completed   Zoster Vaccines- Shingrix  Completed   Hepatitis B Vaccines  Aged Out   HPV VACCINES  Aged Out   Meningococcal B Vaccine  Aged Out   Lung Cancer Screening  Discontinued   Colonoscopy  Discontinued    Health Maintenance  There are no preventive care reminders to display for this patient.  Colorectal cancer screening: No longer required.   Mammogram status: Completed 11/24/2023. Repeat every year  Bone Density status: Completed 11/2021. Results reflect: Bone density results: NORMAL. Repeat  every 5 years.  Lung Cancer Screening: (Low Dose CT Chest recommended if Age 83-80 years, 20 pack-year currently smoking OR have quit w/in 15years.) does not qualify.   Lung Cancer Screening Referral: No  Additional Screening:  Hepatitis C Screening: does not qualify; Completed   Vision Screening: Recommended annual ophthalmology exams for early detection of glaucoma and other disorders of the eye. Is the patient up to date with their annual eye exam?  Yes  Who is the provider or what is the name of the office in which the patient attends annual eye exams? Dr. Octavia If pt is not established with a provider, would they like to be referred to a provider to establish care? No .   Dental Screening: Recommended annual dental exams for proper oral hygiene  Diabetic Foot Exam: Diabetic Foot Exam: Overdue, Pt has been advised about the importance in completing this exam. Pt is scheduled for diabetic foot exam on next routine visit.  Community Resource Referral / Chronic Care Management: CRR required this visit?  No   CCM required this visit?  No     Plan:     I have personally reviewed and noted the following in the patient's chart:   Medical and social history Use of alcohol, tobacco or illicit drugs  Current medications and supplements including opioid prescriptions. Patient is not currently taking opioid prescriptions. Functional ability and status Nutritional status Physical activity Advanced directives List of other physicians Hospitalizations, surgeries, and ER visits in previous 12 months Vitals Screenings to include cognitive, depression, and falls Referrals and appointments  In addition, I have reviewed and discussed with patient certain preventive protocols, quality metrics, and best practice recommendations. A written personalized care plan for preventive services as well as general preventive health recommendations were provided to patient.  Virtual Visit   I  connected with Eliot Croghan by video visit and verified that I am speaking with the correct person using two identifiers.  Location: [facility]  Patient: Tamara Davidson Provider: Greig FORBES Cluster, NP    I discussed the limitations, risks, security and privacy concerns of performing an evaluation and management service by telephone and the availability of in person appointments. I also discussed with the patient that there may be a patient responsible charge related to this service. The patient expressed understanding and agreed to proceed.   I discussed the assessment and treatment plan with the patient. The patient was provided an opportunity to ask questions and all were answered. The patient agreed with the plan and demonstrated an understanding of the instructions.  The patient was advised to call back or seek an in-person evaluation if the symptoms worsen or if the condition fails to improve as anticipated.  I provided 15 minutes of face-to-face time during this encounter.  Greig FORBES Cluster, NP  Avs printed and mailed       Greig FORBES Cluster, NP   05/01/2024   After Visit Summary: (MyChart) Due to this being a telephonic visit, the after visit summary with patients personalized plan was offered to patient via MyChart   Nurse Notes: UTD on vaccinations. 12/2023 colonoscopy done> no f/u colonoscopy recommended. Mammogram 10/2023. DEXA 2023> normal. CIT 6 score within normal range.

## 2024-05-12 ENCOUNTER — Ambulatory Visit: Admitting: Podiatry

## 2024-05-12 VITALS — Ht 61.0 in | Wt 113.0 lb

## 2024-05-12 DIAGNOSIS — M25872 Other specified joint disorders, left ankle and foot: Secondary | ICD-10-CM | POA: Diagnosis not present

## 2024-05-12 DIAGNOSIS — B351 Tinea unguium: Secondary | ICD-10-CM | POA: Diagnosis not present

## 2024-05-14 NOTE — Progress Notes (Signed)
 Subjective: Chief Complaint  Patient presents with   Bunions    Rm 11  Patient is here as a follow-up for the left foot bunion. Patient states pain in left hallux on bottom of left foot. Patient is requesting nail trimming today. Patient request refill of fluconazole .      Tamara Davidson is a 78 y.o. is seen today in office today for follow up of nail fungus and also want to have her left foot checked.  The nail fungus she has been on fluconazole  for some time and not sensitive.  Gets occasional discomfort of the toenails but no swelling or redness or any drainage.  Also had some discomfort after wearing a new shoe to the bottom of the foot pointing to the sesamoids.  No injuries that she reports.  The pain has since subsided.   Objective: General: No acute distress, AAOx3  DP/PT pulses palpable 2/4, CRT < 3 sec to all digits.  Protective sensation intact. Motor function intact.  Scar from the prior surgeries are well-healed.  Not able to appreciate any tenderness in the area the bunion on the sesamoids today.  There is no area of pinpoint tenderness.  No erythema or warmth.  Appear to be about the same.  It did improve slightly at some point but now they continue to remain hypertrophic and dystrophic with yellow, dark discoloration multiple toenails.  There is no extension of hyperpigmentation of the surrounding skin.  There is no spreading erythema, ascending status.  No fluctuation or crepitation.  There is no malodor. No pain with calf compression, swelling, warmth, erythema.   Assessment and Plan:  Status post left foot first MPJ arthrodesis, onychomycosis  History of first MPJ arthrodesis -Patient was experiencing sesamoiditis with a change in shoes.  Pain since subsided.  Dispensed metatarsal pad that she did not get last appointment to help offload.  Monitor for any reoccurrence.   Onychomycosis - She is on fluconazole  without significant improvement. The nails remain  hypertrophic and dystrophic and discolored.  Attributed to the nail and complications of bleeding as listed pathology for further evaluation to see if there is any change in the nails.  Return in about 3 months (around 08/12/2024) for nail culture results.  Tamara Davidson DPM

## 2024-05-26 ENCOUNTER — Other Ambulatory Visit: Payer: Medicare HMO

## 2024-05-26 DIAGNOSIS — I10 Essential (primary) hypertension: Secondary | ICD-10-CM

## 2024-05-26 DIAGNOSIS — R7303 Prediabetes: Secondary | ICD-10-CM

## 2024-05-27 ENCOUNTER — Ambulatory Visit: Payer: Self-pay | Admitting: Orthopedic Surgery

## 2024-05-27 LAB — COMPLETE METABOLIC PANEL WITHOUT GFR
AG Ratio: 1.4 (calc) (ref 1.0–2.5)
ALT: 13 U/L (ref 6–29)
AST: 17 U/L (ref 10–35)
Albumin: 4.4 g/dL (ref 3.6–5.1)
Alkaline phosphatase (APISO): 88 U/L (ref 37–153)
BUN: 23 mg/dL (ref 7–25)
CO2: 27 mmol/L (ref 20–32)
Calcium: 9.4 mg/dL (ref 8.6–10.4)
Chloride: 100 mmol/L (ref 98–110)
Creat: 0.8 mg/dL (ref 0.60–1.00)
Globulin: 3.1 g/dL (ref 1.9–3.7)
Glucose, Bld: 92 mg/dL (ref 65–99)
Potassium: 4 mmol/L (ref 3.5–5.3)
Sodium: 137 mmol/L (ref 135–146)
Total Bilirubin: 0.5 mg/dL (ref 0.2–1.2)
Total Protein: 7.5 g/dL (ref 6.1–8.1)

## 2024-05-27 LAB — CBC WITH DIFFERENTIAL/PLATELET
Absolute Lymphocytes: 3243 {cells}/uL (ref 850–3900)
Absolute Monocytes: 497 {cells}/uL (ref 200–950)
Basophils Absolute: 28 {cells}/uL (ref 0–200)
Basophils Relative: 0.4 %
Eosinophils Absolute: 110 {cells}/uL (ref 15–500)
Eosinophils Relative: 1.6 %
HCT: 40.5 % (ref 35.0–45.0)
Hemoglobin: 13.2 g/dL (ref 11.7–15.5)
MCH: 29.9 pg (ref 27.0–33.0)
MCHC: 32.6 g/dL (ref 32.0–36.0)
MCV: 91.6 fL (ref 80.0–100.0)
MPV: 9.1 fL (ref 7.5–12.5)
Monocytes Relative: 7.2 %
Neutro Abs: 3022 {cells}/uL (ref 1500–7800)
Neutrophils Relative %: 43.8 %
Platelets: 253 Thousand/uL (ref 140–400)
RBC: 4.42 Million/uL (ref 3.80–5.10)
RDW: 13.5 % (ref 11.0–15.0)
Total Lymphocyte: 47 %
WBC: 6.9 Thousand/uL (ref 3.8–10.8)

## 2024-05-27 LAB — HEMOGLOBIN A1C
Hgb A1c MFr Bld: 6.2 % — ABNORMAL HIGH (ref <5.7)
Mean Plasma Glucose: 131 mg/dL
eAG (mmol/L): 7.3 mmol/L

## 2024-05-29 ENCOUNTER — Encounter: Payer: Self-pay | Admitting: Orthopedic Surgery

## 2024-05-29 ENCOUNTER — Ambulatory Visit: Payer: Medicare HMO | Admitting: Orthopedic Surgery

## 2024-05-29 VITALS — BP 126/60 | HR 74 | Temp 97.1°F | Resp 16 | Ht 61.0 in | Wt 114.8 lb

## 2024-05-29 DIAGNOSIS — R7303 Prediabetes: Secondary | ICD-10-CM | POA: Diagnosis not present

## 2024-05-29 DIAGNOSIS — L989 Disorder of the skin and subcutaneous tissue, unspecified: Secondary | ICD-10-CM

## 2024-05-29 DIAGNOSIS — I10 Essential (primary) hypertension: Secondary | ICD-10-CM | POA: Diagnosis not present

## 2024-05-29 DIAGNOSIS — E7800 Pure hypercholesterolemia, unspecified: Secondary | ICD-10-CM

## 2024-05-29 DIAGNOSIS — M25551 Pain in right hip: Secondary | ICD-10-CM

## 2024-05-29 NOTE — Progress Notes (Unsigned)
 Careteam: Patient Care Team: Gil Greig BRAVO, NP as PCP - General (Adult Health Nurse Practitioner) Kay Kemps, MD as Consulting Physician (Orthopedic Surgery) Cleatus Collar, MD as Consulting Physician (Ophthalmology)  Seen by: Greig Gil, AGNP-C  PLACE OF SERVICE:  [facility]  Advanced Directive information Does Patient Have a Medical Advance Directive?: Yes, Type of Advance Directive: Healthcare Power of [place];Living will, Does patient want to make changes to medical advance directive?: No - Patient declined  Allergies  Allergen Reactions   Chocolate Diarrhea   Chocolate Flavoring Agent (Non-Screening)    Iodine    Oxycodone  Itching   Penicillins    Povidone Iodine    Shellfish Allergy    Statins Other (See Comments)    High LFT's   Strawberry (Diagnostic) Itching   Strawberry Extract     Chief Complaint  Patient presents with   Medical Management of Chronic Issues    6 month follow up. Discuss lab results.      HPI: Patient is a 78 y.o. female seen today for medical management of chronic conditions.   Discussed the use of AI scribe software for clinical note transcription with the patient, who gave verbal consent to proceed.  History of Present Illness    Tamara Davidson is a 78 year old female with prediabetes and osteoarthritis who presents for a follow-up visit.  Her A1c levels have increased from 5.9 a year ago to 6.2 currently. She remains in the prediabetes range. She has increased her carbohydrate intake, particularly pasta, and wants to reduce her A1c levels back to 5.9.  She has noticed a crusting lesion on her face for the past two months, which crusts over and then clears when washed, but reappears and is spreading down her cheek. She is seeking a referral to a dermatologist for further evaluation.  She has a history of osteoarthritis in her right hip since her mid-twenties and reports a recent exacerbation of hip pain, describing it as  feeling like being hit with a sledgehammer. The pain has been severe enough to wake her at night, although it does not affect her mobility during the day. She has been using Tylenol  as needed for pain relief, as Aleve was ineffective, and takes two extra strength tablets when the pain is bothersome.  No chest pain, shortness of breath, or urinary issues. She is up to date on her vaccinations and plans to receive her pneumonia and flu shots in the coming months.        Review of Systems:  Review of Systems  Constitutional: Negative.   HENT: Negative.    Respiratory: Negative.    Cardiovascular: Negative.   Gastrointestinal: Negative.   Genitourinary: Negative.   Musculoskeletal:  Positive for joint pain.  Skin: Negative.   Neurological: Negative.   Psychiatric/Behavioral: Negative.      Past Medical History:  Diagnosis Date   Disorder of bone and cartilage, unspecified    Disorders of bursae and tendons in shoulder region, unspecified    Diverticulitis of colon (without mention of hemorrhage)(562.11)    Essential hypertension, benign    Ganglion of tendon sheath    Lumbago    Other abnormal blood chemistry    Other and unspecified hyperlipidemia    Premenopausal menorrhagia    Sebaceous cyst    Shortness of breath    Past Surgical History:  Procedure Laterality Date   DG FOOT LEFT COMPLETE ([facility] HX) Left 12/13/2022   DG FOOT RIGHT COMPLETE ([facility] HX) Right  06/01/2022   ROTATOR CUFF REPAIR Left 10/06/2013   tendon and ligament repair   Social History:   reports that she has quit smoking. Her smoking use included cigarettes. She started smoking about 54 years ago. She has a 54.6 pack-year smoking history. She has never used smokeless tobacco. She reports that she does not drink alcohol and does not use drugs.  Family History  Problem Relation Age of Onset   Lung cancer Mother    Osteoporosis Mother    Coronary artery disease Father    Stroke Sister      Medications: Patient's Medications  New Prescriptions   No medications on file  Previous Medications   ASPIRIN 81 MG TABLET    Take 81 mg by mouth daily.    AZELASTINE HCL (ASTEPRO) 0.15 % SOLN    Place 2 sprays into both nostrils every morning.   B COMPLEX VITAMINS (B COMPLEX PO)    Take 1 tablet by mouth daily.   CALCIUM  CARBONATE (OSCAL) 1500 (600 CA) MG TABS TABLET    Take 1 tablet by mouth 2 (two) times daily with a meal.   CHOLECALCIFEROL (VITAMIN D ) 400 UNITS TABS TABLET    Take 400 Units by mouth daily.    GLUCOSAMINE-CHONDROIT-VIT C-MN (GLUCOSAMINE CHONDR 1500 COMPLX PO)    Take by mouth daily.   GUAIFENESIN (MUCINEX) 600 MG 12 HR TABLET    Take 600 mg by mouth daily.    LEVOCETIRIZINE (XYZAL) 5 MG TABLET    Take 5 mg by mouth every evening.   LISINOPRIL -HYDROCHLOROTHIAZIDE  (ZESTORETIC ) 10-12.5 MG TABLET    Take 1 tablet by mouth daily.   MULTIPLE VITAMINS-MINERALS (MULTIVITAMIN WITH MINERALS) TABLET    Take 1 tablet by mouth daily.   PROBIOTIC PRODUCT (PROBIOTIC DAILY PO)    Take 1 tablet by mouth daily.    ROSUVASTATIN  (CRESTOR ) 5 MG TABLET    TAKE ONE-HALF (1/2) TABLET AT BEDTIME ON MONDAY, WEDNESDAY, AND FRIDAY (AVOID GRAPEFRUIT PRODUCTS)   VITAMIN C (ASCORBIC ACID) 500 MG TABLET    Take 500 mg by mouth daily.  Modified Medications   No medications on file  Discontinued Medications   FLUCONAZOLE  (DIFLUCAN ) 150 MG TABLET    Take 1 tablet (150 mg total) by mouth once a week.   TAVABOROLE  (KERYDIN ) 5 % SOLN    Apply 1 drop topically daily. Apply 1 drop to the toenail daily.    Physical Exam:  Vitals:   05/29/24 0955  BP: 126/60  Pulse: 74  Resp: 16  Temp: (!) 97.1 F (36.2 C)  SpO2: 98%  Weight: 114 lb 12.8 oz (52.1 kg)  Height: 5' 1 (1.549 m)   Body mass index is 21.69 kg/m. Wt Readings from Last 3 Encounters:  05/29/24 114 lb 12.8 oz (52.1 kg)  05/12/24 113 lb (51.3 kg)  11/29/23 115 lb 12.8 oz (52.5 kg)    Physical Exam Vitals reviewed.   Constitutional:      General: She is not in acute distress. HENT:     Head: Normocephalic.  Eyes:     General:        Right eye: No discharge.        Left eye: No discharge.  Cardiovascular:     Rate and Rhythm: Normal rate and regular rhythm.     Pulses: Normal pulses.     Heart sounds: Normal heart sounds.  Pulmonary:     Effort: Pulmonary effort is normal.     Breath sounds: Normal breath sounds.  Abdominal:     General: Bowel sounds are normal.     Palpations: Abdomen is soft.  Musculoskeletal:     Cervical back: Neck supple.     Right lower leg: No edema.     Left lower leg: No edema.  Skin:    General: Skin is warm.     Capillary Refill: Capillary refill takes less than 2 seconds.     Comments: Pinpoint area of skin discoloration to left cheek  Neurological:     General: No focal deficit present.     Mental Status: She is alert and oriented to person, place, and time.  Psychiatric:        Mood and Affect: Mood normal.     Labs reviewed: Basic Metabolic Panel: Recent Labs    11/26/23 0914 05/26/24 0837  NA 137 137  K 3.9 4.0  CL 99 100  CO2 29 27  GLUCOSE 97 92  BUN 22 23  CREATININE 0.76 0.80  CALCIUM  9.6 9.4   Liver Function Tests: Recent Labs    05/31/23 0917 11/26/23 0914 03/18/24 0907 05/26/24 0837  AST 21 22 19 17   ALT 14 20 14 13   ALKPHOS 111  --   --   --   BILITOT 0.4 0.5 0.5 0.5  PROT 7.6 7.5 7.7 7.5  ALBUMIN 4.6  --   --   --    No results for input(s): LIPASE, AMYLASE in the last 8760 hours. No results for input(s): AMMONIA in the last 8760 hours. CBC: Recent Labs    11/26/23 0914 03/18/24 0907 05/26/24 0837  WBC 6.2 7.1 6.9  NEUTROABS 2,356 3,110 3,022  HGB 13.4 13.6 13.2  HCT 40.2 41.6 40.5  MCV 91.0 92.0 91.6  PLT 210 244 253   Lipid Panel: No results for input(s): CHOL, HDL, LDLCALC, TRIG, CHOLHDL, LDLDIRECT in the last 8760 hours. TSH: No results for input(s): TSH in the last 8760  hours. A1C: Lab Results  Component Value Date   HGBA1C 6.2 (H) 05/26/2024     Assessment/Plan 1. Prediabetes (Primary) - recent A1c 6.2 - diet controlled  - UTD on eye and foot exam - discussed limiting carbs  - cont lisinopril -hydrochlorothiazide  for kidney protection  2. Primary hypertension - controlled with lisinopril -HCTZ  3. Right hip pain - intermittent  - cont tylenol  prn  4. Facial skin lesion - Ambulatory referral to Dermatology  5. Pure hypercholesterolemia - cont rosuvastatin    Total time: 31 minutes. Greater than 50% of total time spent doing patient education regarding prediabetes, HTN, HLD, and right hip pain including symptom/medication management.    Next appt:Visit date not found  Letita Prentiss Gil, [facility]  [facility] Adult [facility] [phone]

## 2024-05-29 NOTE — Patient Instructions (Addendum)
 Please get flu vaccine by 08/30/2024  Please get Prevnar 20  Fasting labs next visit

## 2024-06-04 ENCOUNTER — Ambulatory Visit: Payer: Self-pay | Admitting: Podiatry

## 2024-07-29 ENCOUNTER — Other Ambulatory Visit: Payer: Self-pay | Admitting: Orthopedic Surgery

## 2024-08-12 ENCOUNTER — Ambulatory Visit: Admitting: Podiatry

## 2024-08-12 DIAGNOSIS — M25872 Other specified joint disorders, left ankle and foot: Secondary | ICD-10-CM

## 2024-08-12 DIAGNOSIS — L603 Nail dystrophy: Secondary | ICD-10-CM

## 2024-08-15 NOTE — Progress Notes (Signed)
 Subjective: Chief Complaint  Patient presents with   Nail Problem    Nail Culture appt.  Not diabetic. 81 mg Asprin     Tamara Davidson is a 78 y.o. is seen today in office today for follow up of nail fungus and also want to have her left foot checked.  She states her foot has been doing better.  She wears a pad at times on the left bunion area when it does get sore but she does not report any recent injuries or changes otherwise.   She is been using the topical medication the nails and she does think they are growing not getting better.  They do cause discomfort as they become elongated but otherwise she has been doing well.  No swelling, redness or any drainage.  No open lesions.  No other concerns today.     Objective: General: No acute distress, AAOx3  DP/PT pulses palpable 2/4, CRT < 3 sec to all digits.  Protective sensation intact. Motor function intact.  Scar from the prior surgeries are well-healed.  There is no send tenderness palpation along the first MPJs particular in the left foot today.  There is no area of pinpoint tenderness identified otherwise.  No significant edema there is no erythema. The toenails continue to remain hypertrophic and dystrophic with yellow, dark discoloration multiple toenails.  She gets tenderness nails 1-5 bilaterally.  Overall the color to the nails is better and they are more clear in nature.  There is no hyperpigmentation. No pain with calf compression, swelling, warmth, erythema.   Assessment and Plan:  Status post left foot first MPJ arthrodesis, onychomycosis  History of first MPJ arthrodesis - Currently not having any pain.  Continue with offloading pads as well as shoes to avoid excess pressure and good arch support.   Onychodystrophy -Continue topical medication.  As a courtesy I debrided nails x 10 without any complications or bleeding.  No follow-ups on file.  Tamara Davidson DPM

## 2024-11-13 ENCOUNTER — Ambulatory Visit: Admitting: Podiatry

## 2024-11-17 ENCOUNTER — Ambulatory Visit: Admitting: Podiatry

## 2024-11-17 DIAGNOSIS — M25872 Other specified joint disorders, left ankle and foot: Secondary | ICD-10-CM | POA: Diagnosis not present

## 2024-11-17 DIAGNOSIS — L84 Corns and callosities: Secondary | ICD-10-CM

## 2024-11-17 DIAGNOSIS — B351 Tinea unguium: Secondary | ICD-10-CM | POA: Diagnosis not present

## 2024-11-21 NOTE — Progress Notes (Signed)
 Subjective: Chief Complaint  Patient presents with   [facility]    NIDDM Patient with an A1c of 6.2 presents today for [facility] and nail trim denies tingling and numbness.     Tamara Davidson is a 79 y.o. is seen today in office today for follow up of nail fungus and also want to have her left foot checked.  She has been using topical medication which she thinks has been helping.  No swelling, redness or any drainage.    She points submetatarsal 1 area and sesamoids where she gets the callus and she is asking what this is.  Causes occasional discomfort.  No open lesions.  No injuries.  From a surgical standpoint she has no pain along the first MTPJ's.    Objective: General: No acute distress, AAOx3  DP/PT pulses palpable 2/4, CRT < 3 sec to all digits.  Protective sensation intact. Motor function intact.  Scar from the prior surgeries are well-healed.  Arthrodesis sites are stable.  There is no tenderness palpation along the first MPJs particular in the left foot today.  There is no area of pinpoint tenderness identified otherwise.  No significant edema there is no erythema. She does have a hyperkeratotic lesion on the sesamoids bilaterally which is symmetrical.  There is no underlying ulceration with drainage or signs of infection. The toenails continue to remain hypertrophic and dystrophic with yellow/brown discoloration multiple toenails however this is improving.  She gets tenderness nails 1-5 bilaterally.  Overall the color to the nails is better and they are more clear in nature.  There is no hyperpigmentation. No pain with calf compression, swelling, warmth, erythema.   Assessment and Plan:  Status post left foot first MPJ arthrodesis, onychomycosis  History of first MPJ arthrodesis - Currently not having any pain.  Continue with offloading pads as well as shoes to avoid excess pressure and good arch support. - Courtesy debride the calluses and complications of bleeding.  Close moisturizer,  offloading.   Onychodystrophy -Continue topical medication.  Debrided nails x 10 without complications or bleeding  [name] DPM

## 2024-12-02 ENCOUNTER — Other Ambulatory Visit: Payer: Self-pay

## 2024-12-02 DIAGNOSIS — R7303 Prediabetes: Secondary | ICD-10-CM

## 2024-12-02 DIAGNOSIS — E7800 Pure hypercholesterolemia, unspecified: Secondary | ICD-10-CM

## 2024-12-02 DIAGNOSIS — I10 Essential (primary) hypertension: Secondary | ICD-10-CM

## 2024-12-03 LAB — HEMOGLOBIN A1C
Hgb A1c MFr Bld: 5.8 % — ABNORMAL HIGH
Mean Plasma Glucose: 120 mg/dL
eAG (mmol/L): 6.6 mmol/L

## 2024-12-03 LAB — LIPID PANEL
Cholesterol: 180 mg/dL
HDL: 66 mg/dL
LDL Cholesterol (Calc): 95 mg/dL
Non-HDL Cholesterol (Calc): 114 mg/dL
Total CHOL/HDL Ratio: 2.7 (calc)
Triglycerides: 94 mg/dL

## 2024-12-03 LAB — COMPLETE METABOLIC PANEL WITHOUT GFR
AG Ratio: 1.4 (calc) (ref 1.0–2.5)
ALT: 13 U/L (ref 6–29)
AST: 17 U/L (ref 10–35)
Albumin: 4.4 g/dL (ref 3.6–5.1)
Alkaline phosphatase (APISO): 90 U/L (ref 37–153)
BUN: 16 mg/dL (ref 7–25)
CO2: 27 mmol/L (ref 20–32)
Calcium: 9.5 mg/dL (ref 8.6–10.4)
Chloride: 103 mmol/L (ref 98–110)
Creat: 0.83 mg/dL (ref 0.60–1.00)
Globulin: 3.1 g/dL (ref 1.9–3.7)
Glucose, Bld: 96 mg/dL (ref 65–99)
Potassium: 4.1 mmol/L (ref 3.5–5.3)
Sodium: 138 mmol/L (ref 135–146)
Total Bilirubin: 0.5 mg/dL (ref 0.2–1.2)
Total Protein: 7.5 g/dL (ref 6.1–8.1)

## 2024-12-03 LAB — CBC WITH DIFFERENTIAL/PLATELET
Absolute Lymphocytes: 2200 {cells}/uL (ref 850–3900)
Absolute Monocytes: 407 {cells}/uL (ref 200–950)
Basophils Absolute: 20 {cells}/uL (ref 0–200)
Basophils Relative: 0.4 %
Eosinophils Absolute: 59 {cells}/uL (ref 15–500)
Eosinophils Relative: 1.2 %
HCT: 38.6 % (ref 35.9–46.0)
Hemoglobin: 12.9 g/dL (ref 11.7–15.5)
MCH: 30.4 pg (ref 27.0–33.0)
MCHC: 33.4 g/dL (ref 31.6–35.4)
MCV: 90.8 fL (ref 81.4–101.7)
MPV: 10.1 fL (ref 7.5–12.5)
Monocytes Relative: 8.3 %
Neutro Abs: 2215 {cells}/uL (ref 1500–7800)
Neutrophils Relative %: 45.2 %
Platelets: 223 10*3/uL (ref 140–400)
RBC: 4.25 Million/uL (ref 3.80–5.10)
RDW: 13.1 % (ref 11.0–15.0)
Total Lymphocyte: 44.9 %
WBC: 4.9 10*3/uL (ref 3.8–10.8)

## 2024-12-04 ENCOUNTER — Ambulatory Visit: Payer: Self-pay | Admitting: Orthopedic Surgery

## 2024-12-04 ENCOUNTER — Encounter: Payer: Self-pay | Admitting: Orthopedic Surgery

## 2024-12-04 VITALS — BP 92/58 | HR 96 | Temp 97.1°F | Ht 61.0 in | Wt 112.8 lb

## 2024-12-04 DIAGNOSIS — I10 Essential (primary) hypertension: Secondary | ICD-10-CM | POA: Diagnosis not present

## 2024-12-04 DIAGNOSIS — J209 Acute bronchitis, unspecified: Secondary | ICD-10-CM | POA: Diagnosis not present

## 2024-12-04 DIAGNOSIS — E7800 Pure hypercholesterolemia, unspecified: Secondary | ICD-10-CM | POA: Diagnosis not present

## 2024-12-04 DIAGNOSIS — R7303 Prediabetes: Secondary | ICD-10-CM

## 2024-12-04 MED ORDER — ROSUVASTATIN CALCIUM 5 MG PO TABS: ORAL_TABLET | ORAL | 3 refills | Status: AC

## 2024-12-04 MED ORDER — AMLODIPINE BESYLATE 5 MG PO TABS: 2.5000 mg | ORAL_TABLET | Freq: Every day | ORAL | 1 refills | Status: AC

## 2024-12-04 MED ORDER — PREDNISONE 20 MG PO TABS: ORAL_TABLET | ORAL | 0 refills | Status: AC

## 2024-12-04 NOTE — Progress Notes (Signed)
 "   Careteam: Patient Care Team: Gil Greig BRAVO, NP as PCP - General (Adult Health Nurse Practitioner) Kay Kemps, MD as Consulting Physician (Orthopedic Surgery) Cleatus Collar, MD as Consulting Physician (Ophthalmology)  Seen by: Greig Gil, AGNP-C  PLACE OF SERVICE:  [facility]  Advanced Directive information    Allergies[1]  Chief Complaint  Patient presents with   medication management of chronic issues    6 month follow up     HPI: Patient is a 79 y.o. female seen today medical management of chronic conditions.   Discussed the use of AI scribe software for clinical note transcription with the patient, who gave verbal consent to proceed.  History of Present Illness   J Approximately one month ago, she experienced a flu infection after visiting someone in rehab. She received a flu swab and was prescribed flu medication, which she took along with rest and fluids. Following the flu, she developed a persistent dry cough that primarily occurs at night, disturbing her sleep. The cough originates from her chest and sinus area but has since become dry. No acid reflux or significant phlegm production is noted.  During her illness, she experienced a loss of appetite, leading to a diet primarily consisting of soup. She continues to take Xyzal for allergies and Mucinex once a day for sinus issues.  No dizziness or lightheadedness, even when changing positions. She reports adequate fluid intake, using a mason jar with a built-in straw to ensure she drinks enough throughout the day.  She is currently taking a half tablet of Crestor  three days a week for cholesterol management and lisinopril  with hydrochlorothiazide  for blood pressure control. She has an adequate supply of her blood pressure medication at home but requires refills for her cholesterol medication.  Her glucose readings appeared slightly higher than before, but her A1c has improved to 5.8. She attributes this improvement to  dietary changes, including adding cinnamon to her water and reducing sweets intake.     No recent falls, accidents or hospitalizations.      Review of Systems:  Review of Systems  Constitutional: Negative.   HENT: Negative.    Eyes: Negative.   Respiratory:  Positive for cough. Negative for shortness of breath.   Cardiovascular: Negative.   Gastrointestinal: Negative.   Genitourinary: Negative.   Musculoskeletal: Negative.   Skin: Negative.   Neurological: Negative.   Psychiatric/Behavioral: Negative.      Past Medical History:  Diagnosis Date   Disorder of bone and cartilage, unspecified    Disorders of bursae and tendons in shoulder region, unspecified    Diverticulitis of colon (without mention of hemorrhage)(562.11)    Essential hypertension, benign    Ganglion of tendon sheath    Lumbago    Other abnormal blood chemistry    Other and unspecified hyperlipidemia    Premenopausal menorrhagia    Sebaceous cyst    Shortness of breath    Past Surgical History:  Procedure Laterality Date   DG FOOT LEFT COMPLETE ([facility] HX) Left 12/13/2022   DG FOOT RIGHT COMPLETE ([facility] HX) Right 06/01/2022   ROTATOR CUFF REPAIR Left 10/06/2013   tendon and ligament repair   Social History:   reports that she has quit smoking. Her smoking use included cigarettes. She started smoking about 55 years ago. She has a 55.1 pack-year smoking history. She has never used smokeless tobacco. She reports that she does not drink alcohol and does not use drugs.  Family History  Problem Relation Age of  Onset   Lung cancer Mother    Osteoporosis Mother    Coronary artery disease Father    Stroke Sister     Medications: Patient's Medications  New Prescriptions   No medications on file  Previous Medications   ASPIRIN 81 MG TABLET    Take 81 mg by mouth daily.    AZELASTINE HCL (ASTEPRO) 0.15 % SOLN    Place 2 sprays into both nostrils every morning.   B COMPLEX VITAMINS (B COMPLEX PO)    Take 1  tablet by mouth daily.   CALCIUM  CARBONATE (OSCAL) 1500 (600 CA) MG TABS TABLET    Take 1 tablet by mouth 2 (two) times daily with a meal.   CHOLECALCIFEROL (VITAMIN D ) 400 UNITS TABS TABLET    Take 400 Units by mouth daily.    GLUCOSAMINE-CHONDROIT-VIT C-MN (GLUCOSAMINE CHONDR 1500 COMPLX PO)    Take by mouth daily.   GUAIFENESIN (MUCINEX) 600 MG 12 HR TABLET    Take 600 mg by mouth daily.    LEVOCETIRIZINE (XYZAL) 5 MG TABLET    Take 5 mg by mouth every evening.   LISINOPRIL -HYDROCHLOROTHIAZIDE  (ZESTORETIC ) 10-12.5 MG TABLET    TAKE 1 TABLET DAILY   MULTIPLE VITAMINS-MINERALS (MULTIVITAMIN WITH MINERALS) TABLET    Take 1 tablet by mouth daily.   PROBIOTIC PRODUCT (PROBIOTIC DAILY PO)    Take 1 tablet by mouth daily.    ROSUVASTATIN  (CRESTOR ) 5 MG TABLET    TAKE ONE-HALF (1/2) TABLET AT BEDTIME ON MONDAY, WEDNESDAY, AND FRIDAY (AVOID GRAPEFRUIT PRODUCTS)   VITAMIN C (ASCORBIC ACID) 500 MG TABLET    Take 500 mg by mouth daily.  Modified Medications   No medications on file  Discontinued Medications   No medications on file    Physical Exam:  Vitals:   12/04/24 1059  BP: (!) 98/52  Pulse: 96  Temp: (!) 97.1 F (36.2 C)  SpO2: 97%  Weight: 112 lb 12.8 oz (51.2 kg)  Height: 5' 1 (1.549 m)   Body mass index is 21.31 kg/m. Wt Readings from Last 3 Encounters:  12/04/24 112 lb 12.8 oz (51.2 kg)  05/29/24 114 lb 12.8 oz (52.1 kg)  05/12/24 113 lb (51.3 kg)    Physical Exam Vitals reviewed.  Constitutional:      General: She is not in acute distress. HENT:     Head: Normocephalic.  Eyes:     General:        Right eye: No discharge.        Left eye: No discharge.  Cardiovascular:     Rate and Rhythm: Normal rate and regular rhythm.     Pulses: Normal pulses.     Heart sounds: Normal heart sounds.  Pulmonary:     Effort: Pulmonary effort is normal.     Breath sounds: Normal breath sounds.  Abdominal:     General: Bowel sounds are normal. There is no distension.      Tenderness: There is no abdominal tenderness.  Musculoskeletal:     Cervical back: Neck supple.     Right lower leg: No edema.     Left lower leg: No edema.  Skin:    General: Skin is warm.     Capillary Refill: Capillary refill takes less than 2 seconds.  Neurological:     General: No focal deficit present.     Mental Status: She is alert and oriented to person, place, and time.     Gait: Gait normal.  Psychiatric:  Mood and Affect: Mood normal.     Labs reviewed: Basic Metabolic Panel: Recent Labs    05/26/24 0837 12/02/24 1136  NA 137 138  K 4.0 4.1  CL 100 103  CO2 27 27  GLUCOSE 92 96  BUN 23 16  CREATININE 0.80 0.83  CALCIUM  9.4 9.5   Liver Function Tests: Recent Labs    03/18/24 0907 05/26/24 0837 12/02/24 1136  AST 19 17 17   ALT 14 13 13   BILITOT 0.5 0.5 0.5  PROT 7.7 7.5 7.5   No results for input(s): LIPASE, AMYLASE in the last 8760 hours. No results for input(s): AMMONIA in the last 8760 hours. CBC: Recent Labs    03/18/24 0907 05/26/24 0837 12/02/24 1136  WBC 7.1 6.9 4.9  NEUTROABS 3,110 3,022 2,215  HGB 13.6 13.2 12.9  HCT 41.6 40.5 38.6  MCV 92.0 91.6 90.8  PLT 244 253 223   Lipid Panel: Recent Labs    12/02/24 1136  CHOL 180  HDL 66  LDLCALC 95  TRIG 94  CHOLHDL 2.7   TSH: No results for input(s): TSH in the last 8760 hours. A1C: Lab Results  Component Value Date   HGBA1C 5.8 (H) 12/02/2024     Assessment/Plan 1. Prediabetes (Primary) - improved A1c> now 5.8 - diet controlled  - UTD on eye exam - continue diet low in carbs and sugars  2. Essential hypertension, benign - running low, SBP 90's - discontinue lisinopril -hydrochlorothiazide  - start low dose amlodipine  - home blood pressures BID x 1 month> bring next visit  - continue low sodium diet - amLODipine  (NORVASC ) 5 MG tablet; Take 0.5 tablets (2.5 mg total) by mouth daily.  Dispense: 90 tablet; Refill: 1  3. Pure hypercholesterolemia - LDL  stable  - cont Crestor  3x/week - rosuvastatin  (CRESTOR ) 5 MG tablet; TAKE ONE-HALF (1/2) TABLET AT BEDTIME ON MONDAY, WEDNESDAY, AND FRIDAY (AVOID GRAPEFRUIT PRODUCTS)  Dispense: 45 tablet; Refill: 3  4. Acute bronchitis, unspecified organism - flu a few weeks ago - dry cough, more in evening - suspect rebound bronchitis - start prednisone  taper  Total time: 31 minutes. Greater than 50% of total time spent doing patient education regarding health maintenance, HTN, HLD and prediabetes including symptom/medication management.    Next appt: 01/01/2025  Greig Gil BODILY  [facility] [phone]      [1]  Allergies Allergen Reactions   Chocolate Diarrhea   Chocolate Flavoring Agent (Non-Screening)    Iodine    Oxycodone  Itching   Penicillins    Povidone Iodine    Shellfish Allergy    Statins Other (See Comments)    High LFT's   Strawberry (Diagnostic) Itching   Strawberry Extract    "

## 2024-12-04 NOTE — Patient Instructions (Addendum)
 Stop lisinopril -hydrochlorothiazide > I think it is making your blood pressure too low  Start amlodipine  2.5 mg ( 1/2 tablet) daily   Check blood pressures twice daily > 2 hours after taking amlodipine  and sometime in evening> write readings down and bring next visit   Recommend drinking pickle juice if feeling dizzy and contact provider> be safe! Relax and prevent falls !!!

## 2025-01-01 ENCOUNTER — Ambulatory Visit: Admitting: Orthopedic Surgery

## 2025-01-08 ENCOUNTER — Ambulatory Visit: Admitting: Dermatology

## 2025-02-16 ENCOUNTER — Ambulatory Visit: Admitting: Podiatry

## 2025-06-01 ENCOUNTER — Other Ambulatory Visit

## 2025-06-04 ENCOUNTER — Ambulatory Visit: Admitting: Orthopedic Surgery
